# Patient Record
Sex: Female | Born: 1960 | Race: White | Hispanic: No | Marital: Married | Smoking: Never smoker
Health system: Southern US, Community
[De-identification: ages and names within clinical notes are randomized; demographics above are authoritative.]

## PROBLEM LIST (undated history)

## (undated) DIAGNOSIS — Z9221 Personal history of antineoplastic chemotherapy: Secondary | ICD-10-CM

## (undated) DIAGNOSIS — C50919 Malignant neoplasm of unspecified site of unspecified female breast: Secondary | ICD-10-CM

## (undated) DIAGNOSIS — R195 Other fecal abnormalities: Secondary | ICD-10-CM

## (undated) DIAGNOSIS — Z1371 Encounter for nonprocreative screening for genetic disease carrier status: Secondary | ICD-10-CM

## (undated) DIAGNOSIS — E041 Nontoxic single thyroid nodule: Secondary | ICD-10-CM

## (undated) DIAGNOSIS — Z923 Personal history of irradiation: Secondary | ICD-10-CM

## (undated) DIAGNOSIS — N649 Disorder of breast, unspecified: Secondary | ICD-10-CM

## (undated) DIAGNOSIS — C50911 Malignant neoplasm of unspecified site of right female breast: Principal | ICD-10-CM

## (undated) DIAGNOSIS — N83209 Unspecified ovarian cyst, unspecified side: Secondary | ICD-10-CM

## (undated) HISTORY — PX: BREAST CYST EXCISION: SHX579

## (undated) HISTORY — DX: Nontoxic single thyroid nodule: E04.1

## (undated) HISTORY — DX: Encounter for nonprocreative screening for genetic disease carrier status: Z13.71

## (undated) HISTORY — DX: Disorder of breast, unspecified: N64.9

## (undated) HISTORY — DX: Unspecified ovarian cyst, unspecified side: N83.209

## (undated) HISTORY — PX: CHOLECYSTECTOMY: SHX55

## (undated) HISTORY — PX: BREAST LUMPECTOMY: SHX2

## (undated) HISTORY — PX: LIPOMA EXCISION: SHX5283

## (undated) HISTORY — DX: Malignant neoplasm of unspecified site of right female breast: C50.911

## (undated) HISTORY — DX: Malignant neoplasm of unspecified site of unspecified female breast: C50.919

## (undated) HISTORY — DX: Other fecal abnormalities: R19.5

---

## 2001-07-11 ENCOUNTER — Other Ambulatory Visit: Admission: RE | Admit: 2001-07-11 | Discharge: 2001-07-11 | Payer: Self-pay | Admitting: Obstetrics and Gynecology

## 2001-10-22 ENCOUNTER — Encounter: Payer: Self-pay | Admitting: Obstetrics and Gynecology

## 2001-10-22 ENCOUNTER — Ambulatory Visit (HOSPITAL_COMMUNITY): Admission: RE | Admit: 2001-10-22 | Discharge: 2001-10-22 | Payer: Self-pay | Admitting: Obstetrics and Gynecology

## 2003-01-15 ENCOUNTER — Encounter: Payer: Self-pay | Admitting: Specialist

## 2003-01-15 ENCOUNTER — Ambulatory Visit (HOSPITAL_COMMUNITY): Admission: RE | Admit: 2003-01-15 | Discharge: 2003-01-15 | Payer: Self-pay | Admitting: Specialist

## 2004-03-15 ENCOUNTER — Ambulatory Visit (HOSPITAL_COMMUNITY): Admission: RE | Admit: 2004-03-15 | Discharge: 2004-03-15 | Payer: Self-pay | Admitting: Obstetrics and Gynecology

## 2004-10-28 ENCOUNTER — Ambulatory Visit (HOSPITAL_COMMUNITY): Admission: RE | Admit: 2004-10-28 | Discharge: 2004-10-28 | Payer: Self-pay | Admitting: General Surgery

## 2005-03-14 ENCOUNTER — Ambulatory Visit (HOSPITAL_COMMUNITY): Admission: RE | Admit: 2005-03-14 | Discharge: 2005-03-14 | Payer: Self-pay | Admitting: Obstetrics and Gynecology

## 2005-04-19 ENCOUNTER — Ambulatory Visit (HOSPITAL_COMMUNITY): Admission: RE | Admit: 2005-04-19 | Discharge: 2005-04-19 | Payer: Self-pay | Admitting: Obstetrics and Gynecology

## 2005-10-12 ENCOUNTER — Ambulatory Visit (HOSPITAL_COMMUNITY): Admission: RE | Admit: 2005-10-12 | Discharge: 2005-10-12 | Payer: Self-pay | Admitting: Obstetrics and Gynecology

## 2005-11-08 ENCOUNTER — Ambulatory Visit (HOSPITAL_COMMUNITY): Admission: RE | Admit: 2005-11-08 | Discharge: 2005-11-08 | Payer: Self-pay | Admitting: Obstetrics and Gynecology

## 2005-11-21 ENCOUNTER — Encounter (HOSPITAL_COMMUNITY): Admission: RE | Admit: 2005-11-21 | Discharge: 2005-11-22 | Payer: Self-pay | Admitting: Endocrinology

## 2005-12-27 ENCOUNTER — Encounter (HOSPITAL_COMMUNITY): Admission: RE | Admit: 2005-12-27 | Discharge: 2006-01-26 | Payer: Self-pay | Admitting: Endocrinology

## 2006-04-11 ENCOUNTER — Ambulatory Visit (HOSPITAL_COMMUNITY): Admission: RE | Admit: 2006-04-11 | Discharge: 2006-04-11 | Payer: Self-pay | Admitting: Obstetrics and Gynecology

## 2008-10-16 ENCOUNTER — Ambulatory Visit (HOSPITAL_COMMUNITY): Admission: RE | Admit: 2008-10-16 | Discharge: 2008-10-16 | Payer: Self-pay | Admitting: Obstetrics & Gynecology

## 2008-11-13 ENCOUNTER — Other Ambulatory Visit: Admission: RE | Admit: 2008-11-13 | Discharge: 2008-11-13 | Payer: Self-pay | Admitting: Obstetrics and Gynecology

## 2008-11-18 ENCOUNTER — Ambulatory Visit (HOSPITAL_COMMUNITY): Admission: RE | Admit: 2008-11-18 | Discharge: 2008-11-18 | Payer: Self-pay | Admitting: Obstetrics & Gynecology

## 2009-09-22 ENCOUNTER — Ambulatory Visit (HOSPITAL_COMMUNITY): Admission: RE | Admit: 2009-09-22 | Discharge: 2009-09-22 | Payer: Self-pay | Admitting: Obstetrics and Gynecology

## 2009-09-28 ENCOUNTER — Encounter: Admission: RE | Admit: 2009-09-28 | Discharge: 2009-09-28 | Payer: Self-pay | Admitting: Obstetrics and Gynecology

## 2009-09-29 ENCOUNTER — Encounter (HOSPITAL_COMMUNITY): Admission: RE | Admit: 2009-09-29 | Discharge: 2009-10-29 | Payer: Self-pay | Admitting: Oncology

## 2009-09-29 ENCOUNTER — Ambulatory Visit (HOSPITAL_COMMUNITY): Payer: Self-pay | Admitting: Oncology

## 2009-10-01 ENCOUNTER — Ambulatory Visit: Payer: Self-pay | Admitting: Cardiology

## 2009-10-01 ENCOUNTER — Encounter (HOSPITAL_COMMUNITY): Payer: Self-pay | Admitting: Oncology

## 2009-10-06 ENCOUNTER — Ambulatory Visit (HOSPITAL_COMMUNITY): Admission: RE | Admit: 2009-10-06 | Discharge: 2009-10-06 | Payer: Self-pay | Admitting: General Surgery

## 2009-10-06 ENCOUNTER — Ambulatory Visit: Payer: Self-pay | Admitting: Genetic Counselor

## 2009-10-18 ENCOUNTER — Ambulatory Visit (HOSPITAL_COMMUNITY): Admission: RE | Admit: 2009-10-18 | Discharge: 2009-10-18 | Payer: Self-pay | Admitting: Oncology

## 2009-11-01 ENCOUNTER — Encounter (HOSPITAL_COMMUNITY): Admission: RE | Admit: 2009-11-01 | Discharge: 2009-12-01 | Payer: Self-pay | Admitting: Oncology

## 2009-11-15 ENCOUNTER — Ambulatory Visit (HOSPITAL_COMMUNITY): Payer: Self-pay | Admitting: Oncology

## 2009-11-25 ENCOUNTER — Encounter (HOSPITAL_COMMUNITY): Payer: Self-pay | Admitting: Oncology

## 2009-11-25 ENCOUNTER — Ambulatory Visit: Payer: Self-pay | Admitting: Internal Medicine

## 2009-12-13 ENCOUNTER — Encounter (HOSPITAL_COMMUNITY): Admission: RE | Admit: 2009-12-13 | Discharge: 2010-01-12 | Payer: Self-pay | Admitting: Oncology

## 2009-12-20 ENCOUNTER — Ambulatory Visit: Payer: Self-pay | Admitting: Cardiology

## 2009-12-20 ENCOUNTER — Encounter (HOSPITAL_COMMUNITY): Payer: Self-pay | Admitting: Oncology

## 2010-01-10 ENCOUNTER — Ambulatory Visit (HOSPITAL_COMMUNITY): Payer: Self-pay | Admitting: Oncology

## 2010-01-17 ENCOUNTER — Encounter (HOSPITAL_COMMUNITY): Admission: RE | Admit: 2010-01-17 | Discharge: 2010-02-16 | Payer: Self-pay | Admitting: Oncology

## 2010-01-19 ENCOUNTER — Ambulatory Visit (HOSPITAL_COMMUNITY): Admission: RE | Admit: 2010-01-19 | Discharge: 2010-01-19 | Payer: Self-pay | Admitting: Oncology

## 2010-02-21 ENCOUNTER — Encounter (HOSPITAL_COMMUNITY): Admission: RE | Admit: 2010-02-21 | Discharge: 2010-02-25 | Payer: Self-pay | Admitting: Oncology

## 2010-02-28 ENCOUNTER — Encounter (HOSPITAL_COMMUNITY)
Admission: RE | Admit: 2010-02-28 | Discharge: 2010-03-30 | Payer: Self-pay | Source: Home / Self Care | Admitting: Oncology

## 2010-02-28 ENCOUNTER — Ambulatory Visit (HOSPITAL_COMMUNITY): Payer: Self-pay | Admitting: Oncology

## 2010-04-13 ENCOUNTER — Ambulatory Visit (HOSPITAL_COMMUNITY): Admission: RE | Admit: 2010-04-13 | Discharge: 2010-04-13 | Payer: Self-pay | Admitting: General Surgery

## 2010-04-13 ENCOUNTER — Encounter: Admission: RE | Admit: 2010-04-13 | Discharge: 2010-04-13 | Payer: Self-pay | Admitting: General Surgery

## 2010-04-27 ENCOUNTER — Ambulatory Visit (HOSPITAL_COMMUNITY): Payer: Self-pay | Admitting: Oncology

## 2010-04-27 ENCOUNTER — Encounter (HOSPITAL_COMMUNITY)
Admission: RE | Admit: 2010-04-27 | Discharge: 2010-05-27 | Payer: Self-pay | Source: Home / Self Care | Attending: Oncology | Admitting: Oncology

## 2010-05-03 ENCOUNTER — Ambulatory Visit
Admission: RE | Admit: 2010-05-03 | Discharge: 2010-05-26 | Payer: Self-pay | Source: Home / Self Care | Attending: Radiation Oncology | Admitting: Radiation Oncology

## 2010-05-10 ENCOUNTER — Other Ambulatory Visit
Admission: RE | Admit: 2010-05-10 | Discharge: 2010-05-10 | Payer: Self-pay | Source: Home / Self Care | Admitting: Obstetrics and Gynecology

## 2010-05-31 ENCOUNTER — Ambulatory Visit
Admission: RE | Admit: 2010-05-31 | Discharge: 2010-06-28 | Payer: Self-pay | Source: Home / Self Care | Attending: Radiation Oncology | Admitting: Radiation Oncology

## 2010-06-19 ENCOUNTER — Encounter (HOSPITAL_COMMUNITY): Payer: Self-pay | Admitting: Internal Medicine

## 2010-06-24 ENCOUNTER — Ambulatory Visit (HOSPITAL_COMMUNITY): Admit: 2010-06-24 | Discharge: 2010-06-28 | Payer: Self-pay | Attending: Oncology | Admitting: Oncology

## 2010-06-24 ENCOUNTER — Encounter (HOSPITAL_COMMUNITY)
Admission: RE | Admit: 2010-06-24 | Discharge: 2010-06-28 | Payer: Self-pay | Source: Home / Self Care | Attending: Oncology | Admitting: Oncology

## 2010-06-29 ENCOUNTER — Ambulatory Visit: Payer: Self-pay | Admitting: Radiation Oncology

## 2010-08-04 ENCOUNTER — Other Ambulatory Visit (HOSPITAL_COMMUNITY): Payer: Self-pay

## 2010-08-09 LAB — BASIC METABOLIC PANEL
BUN: 20 mg/dL (ref 6–23)
CO2: 28 mEq/L (ref 19–32)
Chloride: 103 mEq/L (ref 96–112)
Creatinine, Ser: 0.75 mg/dL (ref 0.4–1.2)
Glucose, Bld: 101 mg/dL — ABNORMAL HIGH (ref 70–99)
Potassium: 3.8 mEq/L (ref 3.5–5.1)

## 2010-08-09 LAB — CBC
HCT: 38.9 % (ref 36.0–46.0)
Hemoglobin: 13.2 g/dL (ref 12.0–15.0)
MCHC: 33.9 g/dL (ref 30.0–36.0)
MCV: 107.8 fL — ABNORMAL HIGH (ref 78.0–100.0)
RDW: 13.5 % (ref 11.5–15.5)

## 2010-08-10 LAB — DIFFERENTIAL
Basophils Absolute: 0 10*3/uL (ref 0.0–0.1)
Basophils Absolute: 0 10*3/uL (ref 0.0–0.1)
Basophils Absolute: 0 10*3/uL (ref 0.0–0.1)
Basophils Relative: 0 % (ref 0–1)
Basophils Relative: 1 % (ref 0–1)
Basophils Relative: 1 % (ref 0–1)
Eosinophils Absolute: 0.1 10*3/uL (ref 0.0–0.7)
Eosinophils Absolute: 0.1 10*3/uL (ref 0.0–0.7)
Eosinophils Absolute: 0.1 10*3/uL (ref 0.0–0.7)
Eosinophils Relative: 2 % (ref 0–5)
Eosinophils Relative: 2 % (ref 0–5)
Eosinophils Relative: 3 % (ref 0–5)
Lymphocytes Relative: 29 % (ref 12–46)
Lymphocytes Relative: 30 % (ref 12–46)
Lymphocytes Relative: 33 % (ref 12–46)
Lymphs Abs: 1.2 10*3/uL (ref 0.7–4.0)
Lymphs Abs: 1.2 10*3/uL (ref 0.7–4.0)
Monocytes Absolute: 0.5 10*3/uL (ref 0.1–1.0)
Monocytes Absolute: 0.5 10*3/uL (ref 0.1–1.0)
Monocytes Absolute: 0.7 10*3/uL (ref 0.1–1.0)
Monocytes Relative: 11 % (ref 3–12)
Monocytes Relative: 12 % (ref 3–12)
Monocytes Relative: 12 % (ref 3–12)
Monocytes Relative: 12 % (ref 3–12)
Neutro Abs: 2.2 10*3/uL (ref 1.7–7.7)
Neutro Abs: 2.3 10*3/uL (ref 1.7–7.7)
Neutro Abs: 2.4 10*3/uL (ref 1.7–7.7)
Neutro Abs: 2.4 10*3/uL (ref 1.7–7.7)
Neutrophils Relative %: 55 % (ref 43–77)
Neutrophils Relative %: 59 % (ref 43–77)

## 2010-08-10 LAB — CBC
HCT: 30.7 % — ABNORMAL LOW (ref 36.0–46.0)
HCT: 33.2 % — ABNORMAL LOW (ref 36.0–46.0)
HCT: 33.3 % — ABNORMAL LOW (ref 36.0–46.0)
HCT: 33.5 % — ABNORMAL LOW (ref 36.0–46.0)
Hemoglobin: 11.4 g/dL — ABNORMAL LOW (ref 12.0–15.0)
Hemoglobin: 11.5 g/dL — ABNORMAL LOW (ref 12.0–15.0)
MCH: 37.8 pg — ABNORMAL HIGH (ref 26.0–34.0)
MCH: 37.8 pg — ABNORMAL HIGH (ref 26.0–34.0)
MCH: 38 pg — ABNORMAL HIGH (ref 26.0–34.0)
MCH: 38.4 pg — ABNORMAL HIGH (ref 26.0–34.0)
MCHC: 34.3 g/dL (ref 30.0–36.0)
MCHC: 34.3 g/dL (ref 30.0–36.0)
MCHC: 34.8 g/dL (ref 30.0–36.0)
MCV: 109.4 fL — ABNORMAL HIGH (ref 78.0–100.0)
MCV: 110.1 fL — ABNORMAL HIGH (ref 78.0–100.0)
MCV: 110.3 fL — ABNORMAL HIGH (ref 78.0–100.0)
MCV: 110.3 fL — ABNORMAL HIGH (ref 78.0–100.0)
Platelets: 253 10*3/uL (ref 150–400)
Platelets: 283 10*3/uL (ref 150–400)
RBC: 2.78 MIL/uL — ABNORMAL LOW (ref 3.87–5.11)
RDW: 14.5 % (ref 11.5–15.5)
RDW: 14.7 % (ref 11.5–15.5)
RDW: 15.3 % (ref 11.5–15.5)
RDW: 15.4 % (ref 11.5–15.5)
WBC: 4.2 10*3/uL (ref 4.0–10.5)
WBC: 4.3 10*3/uL (ref 4.0–10.5)

## 2010-08-10 LAB — COMPREHENSIVE METABOLIC PANEL
ALT: 21 U/L (ref 0–35)
Albumin: 3.8 g/dL (ref 3.5–5.2)
Alkaline Phosphatase: 30 U/L — ABNORMAL LOW (ref 39–117)
Alkaline Phosphatase: 34 U/L — ABNORMAL LOW (ref 39–117)
BUN: 13 mg/dL (ref 6–23)
Calcium: 9.2 mg/dL (ref 8.4–10.5)
Calcium: 9.6 mg/dL (ref 8.4–10.5)
Creatinine, Ser: 0.6 mg/dL (ref 0.4–1.2)
Glucose, Bld: 100 mg/dL — ABNORMAL HIGH (ref 70–99)
Glucose, Bld: 95 mg/dL (ref 70–99)
Potassium: 4 mEq/L (ref 3.5–5.1)
Sodium: 139 mEq/L (ref 135–145)
Total Protein: 6.2 g/dL (ref 6.0–8.3)
Total Protein: 6.3 g/dL (ref 6.0–8.3)

## 2010-08-11 LAB — CBC
HCT: 30.9 % — ABNORMAL LOW (ref 36.0–46.0)
Hemoglobin: 10.6 g/dL — ABNORMAL LOW (ref 12.0–15.0)
Hemoglobin: 10.8 g/dL — ABNORMAL LOW (ref 12.0–15.0)
MCH: 38.3 pg — ABNORMAL HIGH (ref 26.0–34.0)
MCH: 38.4 pg — ABNORMAL HIGH (ref 26.0–34.0)
MCH: 37.3 pg — ABNORMAL HIGH (ref 26.0–34.0)
MCV: 109.9 fL — ABNORMAL HIGH (ref 78.0–100.0)
MCV: 108.5 fL — ABNORMAL HIGH (ref 78.0–100.0)
MCV: 109.2 fL — ABNORMAL HIGH (ref 78.0–100.0)
Platelets: 293 10*3/uL (ref 150–400)
Platelets: 255 10*3/uL (ref 150–400)
Platelets: 256 10*3/uL (ref 150–400)
RBC: 2.75 MIL/uL — ABNORMAL LOW (ref 3.87–5.11)
RBC: 2.81 MIL/uL — ABNORMAL LOW (ref 3.87–5.11)
RDW: 17 % — ABNORMAL HIGH (ref 11.5–15.5)
RDW: 18.1 % — ABNORMAL HIGH (ref 11.5–15.5)
WBC: 4 10*3/uL (ref 4.0–10.5)

## 2010-08-11 LAB — DIFFERENTIAL
Basophils Absolute: 0 10*3/uL (ref 0.0–0.1)
Basophils Relative: 1 % (ref 0–1)
Eosinophils Absolute: 0.1 10*3/uL (ref 0.0–0.7)
Eosinophils Absolute: 0.1 10*3/uL (ref 0.0–0.7)
Eosinophils Absolute: 0.1 10*3/uL (ref 0.0–0.7)
Eosinophils Relative: 3 % (ref 0–5)
Eosinophils Relative: 3 % (ref 0–5)
Lymphs Abs: 0.9 10*3/uL (ref 0.7–4.0)
Lymphs Abs: 1 10*3/uL (ref 0.7–4.0)
Lymphs Abs: 0.9 10*3/uL (ref 0.7–4.0)
Monocytes Absolute: 0.5 10*3/uL (ref 0.1–1.0)
Monocytes Relative: 14 % — ABNORMAL HIGH (ref 3–12)
Monocytes Relative: 14 % — ABNORMAL HIGH (ref 3–12)
Monocytes Relative: 12 % (ref 3–12)
Neutro Abs: 2.5 10*3/uL (ref 1.7–7.7)
Neutrophils Relative %: 61 % (ref 43–77)

## 2010-08-11 LAB — COMPREHENSIVE METABOLIC PANEL
Albumin: 3.6 g/dL (ref 3.5–5.2)
Alkaline Phosphatase: 33 U/L — ABNORMAL LOW (ref 39–117)
BUN: 17 mg/dL (ref 6–23)
Potassium: 3.7 mEq/L (ref 3.5–5.1)
Sodium: 134 mEq/L — ABNORMAL LOW (ref 135–145)
Total Protein: 6.3 g/dL (ref 6.0–8.3)

## 2010-08-12 LAB — COMPREHENSIVE METABOLIC PANEL
BUN: 15 mg/dL (ref 6–23)
CO2: 24 mEq/L (ref 19–32)
Calcium: 8.9 mg/dL (ref 8.4–10.5)
Creatinine, Ser: 0.63 mg/dL (ref 0.4–1.2)
GFR calc Af Amer: >60 mL/min (ref >60)
GFR calc non Af Amer: >60 mL/min (ref >60)
Glucose, Bld: 105 mg/dL — ABNORMAL HIGH (ref 70–99)
Total Bilirubin: 0.3 mg/dL (ref 0.3–1.2)

## 2010-08-12 LAB — DIFFERENTIAL
Basophils Absolute: 0 10*3/uL (ref 0.0–0.1)
Basophils Absolute: 0 10*3/uL (ref 0.0–0.1)
Basophils Relative: 1 % (ref 0–1)
Basophils Relative: 1 % (ref 0–1)
Eosinophils Absolute: 0 10*3/uL (ref 0.0–0.7)
Eosinophils Absolute: 0 10*3/uL (ref 0.0–0.7)
Eosinophils Absolute: 0.1 10*3/uL (ref 0.0–0.7)
Lymphocytes Relative: 11 % — ABNORMAL LOW (ref 12–46)
Lymphs Abs: 0.8 10*3/uL (ref 0.7–4.0)
Lymphs Abs: 1.1 10*3/uL (ref 0.7–4.0)
Monocytes Absolute: 0.7 10*3/uL (ref 0.1–1.0)
Monocytes Relative: 14 % — ABNORMAL HIGH (ref 3–12)
Monocytes Relative: 15 % — ABNORMAL HIGH (ref 3–12)
Neutro Abs: 5.1 10*3/uL (ref 1.7–7.7)
Neutrophils Relative %: 61 % (ref 43–77)
Neutrophils Relative %: 73 % (ref 43–77)
Neutrophils Relative %: 74 % (ref 43–77)

## 2010-08-12 LAB — CBC
HCT: 29 % — ABNORMAL LOW (ref 36.0–46.0)
HCT: 32.3 % — ABNORMAL LOW (ref 36.0–46.0)
Hemoglobin: 10 g/dL — ABNORMAL LOW (ref 12.0–15.0)
Hemoglobin: 11.2 g/dL — ABNORMAL LOW (ref 12.0–15.0)
MCH: 36 pg — ABNORMAL HIGH (ref 26.0–34.0)
MCH: 36.7 pg — ABNORMAL HIGH (ref 26.0–34.0)
MCH: 36.9 pg — ABNORMAL HIGH (ref 26.0–34.0)
MCH: 36.9 pg — ABNORMAL HIGH (ref 26.0–34.0)
MCHC: 34.4 g/dL (ref 30.0–36.0)
MCHC: 34.5 g/dL (ref 30.0–36.0)
MCHC: 34.7 g/dL (ref 30.0–36.0)
MCV: 106.4 fL — ABNORMAL HIGH (ref 78.0–100.0)
Platelets: 245 10*3/uL (ref 150–400)
Platelets: 287 10*3/uL (ref 150–400)
RBC: 2.92 MIL/uL — ABNORMAL LOW (ref 3.87–5.11)
WBC: 7.4 10*3/uL (ref 4.0–10.5)

## 2010-08-13 LAB — DIFFERENTIAL
Basophils Absolute: 0 10*3/uL (ref 0.0–0.1)
Basophils Relative: 1 % (ref 0–1)
Eosinophils Absolute: 0 10*3/uL (ref 0.0–0.7)
Eosinophils Relative: 0 % (ref 0–5)
Monocytes Absolute: 0.8 10*3/uL (ref 0.1–1.0)
Monocytes Relative: 11 % (ref 3–12)

## 2010-08-13 LAB — CBC
MCV: 102.6 fL — ABNORMAL HIGH (ref 78.0–100.0)
Platelets: 182 10*3/uL (ref 150–400)
RBC: 3.06 MIL/uL — ABNORMAL LOW (ref 3.87–5.11)
WBC: 6.8 10*3/uL (ref 4.0–10.5)

## 2010-08-13 LAB — COMPREHENSIVE METABOLIC PANEL
ALT: 16 U/L (ref 0–35)
AST: 20 U/L (ref 0–37)
Albumin: 3.8 g/dL (ref 3.5–5.2)
Alkaline Phosphatase: 49 U/L (ref 39–117)
BUN: 15 mg/dL (ref 6–23)
Chloride: 110 mEq/L (ref 96–112)
GFR calc Af Amer: >60 mL/min (ref >60)
Potassium: 3.8 mEq/L (ref 3.5–5.1)
Sodium: 141 mEq/L (ref 135–145)
Total Bilirubin: 0.2 mg/dL — ABNORMAL LOW (ref 0.3–1.2)

## 2010-08-14 LAB — DIFFERENTIAL
Basophils Absolute: 0 10*3/uL (ref 0.0–0.1)
Basophils Relative: 0 % (ref 0–1)
Basophils Relative: 0 % (ref 0–1)
Eosinophils Absolute: 0 10*3/uL (ref 0.0–0.7)
Eosinophils Absolute: 0 10*3/uL (ref 0.0–0.7)
Eosinophils Relative: 0 % (ref 0–5)
Lymphs Abs: 1.6 10*3/uL (ref 0.7–4.0)
Monocytes Absolute: 1 10*3/uL (ref 0.1–1.0)
Monocytes Absolute: 0.8 10*3/uL (ref 0.1–1.0)
Monocytes Relative: 8 % (ref 3–12)
Neutro Abs: 5.4 10*3/uL (ref 1.7–7.7)
Neutrophils Relative %: 71 % (ref 43–77)
Neutrophils Relative %: 75 % (ref 43–77)

## 2010-08-14 LAB — COMPREHENSIVE METABOLIC PANEL
ALT: 13 U/L (ref 0–35)
AST: 18 U/L (ref 0–37)
Alkaline Phosphatase: 47 U/L (ref 39–117)
Calcium: 9 mg/dL (ref 8.4–10.5)
GFR calc Af Amer: >60 mL/min (ref >60)
Glucose, Bld: 127 mg/dL — ABNORMAL HIGH (ref 70–99)
Potassium: 3.6 mEq/L (ref 3.5–5.1)
Sodium: 139 mEq/L (ref 135–145)
Total Protein: 6.3 g/dL (ref 6.0–8.3)

## 2010-08-14 LAB — CBC
Hemoglobin: 11.3 g/dL — ABNORMAL LOW (ref 12.0–15.0)
MCH: 35.8 pg — ABNORMAL HIGH (ref 26.0–34.0)
MCHC: 34.8 g/dL (ref 30.0–36.0)
MCHC: 34.5 g/dL (ref 30.0–36.0)
RBC: 3.18 MIL/uL — ABNORMAL LOW (ref 3.87–5.11)
RDW: 14.4 % (ref 11.5–15.5)
RDW: 13.3 % (ref 11.5–15.5)

## 2010-08-15 LAB — CBC
Hemoglobin: 12.3 g/dL (ref 12.0–15.0)
MCHC: 34.7 g/dL (ref 30.0–36.0)
MCHC: 35.6 g/dL (ref 30.0–36.0)
MCHC: 33.8 g/dL (ref 30.0–36.0)
MCV: 102.1 fL — ABNORMAL HIGH (ref 78.0–100.0)
MCV: 103 fL — ABNORMAL HIGH (ref 78.0–100.0)
Platelets: 207 10*3/uL (ref 150–400)
Platelets: 300 10*3/uL (ref 150–400)
RBC: 3.42 MIL/uL — ABNORMAL LOW (ref 3.87–5.11)
RDW: 13.1 % (ref 11.5–15.5)
WBC: 6.3 10*3/uL (ref 4.0–10.5)

## 2010-08-15 LAB — DIFFERENTIAL
Basophils Absolute: 0 10*3/uL (ref 0.0–0.1)
Basophils Relative: 0 % (ref 0–1)
Basophils Relative: 1 % (ref 0–1)
Eosinophils Absolute: 0 10*3/uL (ref 0.0–0.7)
Eosinophils Absolute: 0.1 10*3/uL (ref 0.0–0.7)
Eosinophils Relative: 2 % (ref 0–5)
Lymphs Abs: 2.4 10*3/uL (ref 0.7–4.0)
Monocytes Relative: 9 % (ref 3–12)
Monocytes Relative: 10 % (ref 3–12)
Neutro Abs: 3.7 10*3/uL (ref 1.7–7.7)
Neutrophils Relative %: 63 % (ref 43–77)

## 2010-08-15 LAB — COMPREHENSIVE METABOLIC PANEL
ALT: 13 U/L (ref 0–35)
AST: 18 U/L (ref 0–37)
Alkaline Phosphatase: 39 U/L (ref 39–117)
CO2: 22 mEq/L (ref 19–32)
Calcium: 8.9 mg/dL (ref 8.4–10.5)
GFR calc Af Amer: >60 mL/min (ref >60)
GFR calc non Af Amer: >60 mL/min (ref >60)
Glucose, Bld: 99 mg/dL (ref 70–99)
Potassium: 3.5 mEq/L (ref 3.5–5.1)
Sodium: 136 mEq/L (ref 135–145)

## 2010-08-15 LAB — PROTIME-INR
INR: 0.96 (ref 0.00–1.49)
Prothrombin Time: 12.7 seconds (ref 11.6–15.2)

## 2010-08-16 LAB — URINALYSIS, ROUTINE W REFLEX MICROSCOPIC
Bilirubin Urine: NEGATIVE
Glucose, UA: NEGATIVE mg/dL
Nitrite: NEGATIVE
Specific Gravity, Urine: 1.025 (ref 1.005–1.030)
pH: 6.5 (ref 5.0–8.0)

## 2010-08-16 LAB — URINE MICROSCOPIC-ADD ON

## 2010-08-16 LAB — COMPREHENSIVE METABOLIC PANEL
AST: 16 U/L (ref 0–37)
Albumin: 3.8 g/dL (ref 3.5–5.2)
Calcium: 9.3 mg/dL (ref 8.4–10.5)
Creatinine, Ser: 0.79 mg/dL (ref 0.4–1.2)
GFR calc Af Amer: >60 mL/min (ref >60)
GFR calc non Af Amer: >60 mL/min (ref >60)
Total Protein: 7.1 g/dL (ref 6.0–8.3)

## 2010-08-16 LAB — TSH: TSH: 1.967 u[IU]/mL (ref 0.350–4.500)

## 2010-08-16 LAB — CBC
MCHC: 36.3 g/dL — ABNORMAL HIGH (ref 30.0–36.0)
MCV: 100.5 fL — ABNORMAL HIGH (ref 78.0–100.0)
Platelets: 261 10*3/uL (ref 150–400)
RDW: 12.8 % (ref 11.5–15.5)

## 2010-08-16 LAB — DIFFERENTIAL
Eosinophils Relative: 2 % (ref 0–5)
Lymphocytes Relative: 36 % (ref 12–46)
Lymphs Abs: 2.8 10*3/uL (ref 0.7–4.0)
Monocytes Absolute: 0.7 10*3/uL (ref 0.1–1.0)
Monocytes Relative: 9 % (ref 3–12)

## 2010-08-24 ENCOUNTER — Encounter (HOSPITAL_COMMUNITY): Payer: Managed Care, Other (non HMO) | Attending: Oncology

## 2010-08-24 ENCOUNTER — Other Ambulatory Visit (HOSPITAL_COMMUNITY): Payer: Self-pay | Admitting: Oncology

## 2010-08-24 ENCOUNTER — Other Ambulatory Visit (HOSPITAL_COMMUNITY): Payer: Managed Care, Other (non HMO)

## 2010-08-24 ENCOUNTER — Ambulatory Visit (HOSPITAL_COMMUNITY): Payer: Managed Care, Other (non HMO) | Admitting: Oncology

## 2010-08-24 DIAGNOSIS — C50919 Malignant neoplasm of unspecified site of unspecified female breast: Secondary | ICD-10-CM

## 2010-08-24 DIAGNOSIS — Z923 Personal history of irradiation: Secondary | ICD-10-CM | POA: Insufficient documentation

## 2010-08-24 DIAGNOSIS — Z79899 Other long term (current) drug therapy: Secondary | ICD-10-CM | POA: Insufficient documentation

## 2010-10-12 ENCOUNTER — Other Ambulatory Visit (HOSPITAL_COMMUNITY): Payer: Self-pay | Admitting: Oncology

## 2010-10-12 ENCOUNTER — Encounter (HOSPITAL_COMMUNITY): Payer: Managed Care, Other (non HMO) | Attending: Oncology

## 2010-10-12 ENCOUNTER — Encounter (HOSPITAL_COMMUNITY): Payer: Managed Care, Other (non HMO)

## 2010-10-12 DIAGNOSIS — Z923 Personal history of irradiation: Secondary | ICD-10-CM | POA: Insufficient documentation

## 2010-10-12 DIAGNOSIS — C50919 Malignant neoplasm of unspecified site of unspecified female breast: Secondary | ICD-10-CM

## 2010-10-12 DIAGNOSIS — Z452 Encounter for adjustment and management of vascular access device: Secondary | ICD-10-CM

## 2010-10-12 DIAGNOSIS — Z79899 Other long term (current) drug therapy: Secondary | ICD-10-CM | POA: Insufficient documentation

## 2010-10-12 LAB — URINALYSIS, MICROSCOPIC ONLY
Bilirubin Urine: NEGATIVE
Hgb urine dipstick: NEGATIVE
Nitrite: NEGATIVE
Protein, ur: NEGATIVE mg/dL
Urobilinogen, UA: 0.2 mg/dL (ref 0.0–1.0)

## 2010-10-14 NOTE — Op Note (Signed)
NAME:  Vanessa Neal, Vanessa Neal                ACCOUNT NO.:  [id number]   MEDICAL RECORD NO.:  [id number]          PATIENT TYPE:  AMB   LOCATION:  DAY                           FACILITY:  [facility]   PHYSICIAN:  Dalia Heading, M.D.  DATE OF BIRTH:  1960/08/11   DATE OF PROCEDURE:  10/28/2004  DATE OF DISCHARGE:                                 OPERATIVE REPORT   PREOPERATIVE DIAGNOSIS:  Enlarging soft tissue mass, back.   POSTOPERATIVE DIAGNOSIS:  Lipoma of back.   PROCEDURE:  Excision of soft tissue mass, back.   SURGEON:  Dalia Heading, M.D.   ANESTHESIA:  General.   INDICATIONS:  The patient is a 50 year old white female who was referred for  evaluation and treatment of a mass on her back.  It has been present for  some time but recently has increased in size.  The patient now comes to the  operating room for excision of the mass on her back.  The risks and benefits  of the procedure were fully explained to the patient, who gave informed  consent.   PROCEDURE NOTE:  The patient was placed in the left lateral decubitus  position after general anesthesia was administered.  The right upper back was prepped and draped using the usual sterile  technique with Betadine.  Surgical site confirmation was performed.   A 5 cm subcutaneous mass was present along the right upper back.  A  longitudinal incision was made and the mass was excised in total without  difficulty.  A lipoma was found.  Any bleeding was controlled using Bovie  electrocautery.  The specimen was sent to pathology for further examination.  The subcutaneous layer was reapproximated using a 3-0 Vicryl interrupted  suture.  The skin was closed using a 4-0 Vicryl subcuticular suture.  Sensorcaine 0.5% was instilled into the surrounding wound.  Dermabond was then applied.   All tape and needle counts correct at the end of the procedure.  The patient  was awakened and transferred to PACU in stable condition.   COMPLICATIONS:   None.   SPECIMEN:  Mass, back.   BLOOD LOSS:  Minimal.      MAJ/MEDQ  D:  10/28/2004  T:  10/28/2004  Job:  [id number]   cc:   Tilda Burrow, M.D.  Fax: [phone]

## 2010-11-23 ENCOUNTER — Encounter (HOSPITAL_COMMUNITY): Payer: Managed Care, Other (non HMO)

## 2010-11-23 ENCOUNTER — Encounter (HOSPITAL_COMMUNITY): Payer: Managed Care, Other (non HMO) | Attending: Oncology

## 2010-11-23 DIAGNOSIS — Z79899 Other long term (current) drug therapy: Secondary | ICD-10-CM | POA: Insufficient documentation

## 2010-11-23 DIAGNOSIS — Z923 Personal history of irradiation: Secondary | ICD-10-CM | POA: Insufficient documentation

## 2010-11-23 DIAGNOSIS — C50919 Malignant neoplasm of unspecified site of unspecified female breast: Secondary | ICD-10-CM

## 2010-11-23 DIAGNOSIS — Z452 Encounter for adjustment and management of vascular access device: Secondary | ICD-10-CM

## 2011-01-04 ENCOUNTER — Encounter (HOSPITAL_COMMUNITY): Payer: Managed Care, Other (non HMO) | Attending: Oncology

## 2011-01-04 ENCOUNTER — Encounter (HOSPITAL_COMMUNITY): Payer: Managed Care, Other (non HMO)

## 2011-01-04 DIAGNOSIS — C50919 Malignant neoplasm of unspecified site of unspecified female breast: Secondary | ICD-10-CM

## 2011-01-04 DIAGNOSIS — C50911 Malignant neoplasm of unspecified site of right female breast: Secondary | ICD-10-CM | POA: Insufficient documentation

## 2011-01-04 DIAGNOSIS — Z452 Encounter for adjustment and management of vascular access device: Secondary | ICD-10-CM

## 2011-01-04 HISTORY — DX: Malignant neoplasm of unspecified site of right female breast: C50.911

## 2011-01-04 MED ORDER — HEPARIN SOD (PORK) LOCK FLUSH 100 UNIT/ML IV SOLN: 500.0000 [IU] | Freq: Once | INTRAVENOUS | Status: DC

## 2011-01-04 MED ORDER — HEPARIN SOD (PORK) LOCK FLUSH 100 UNIT/ML IV SOLN
INTRAVENOUS | Status: AC
  Filled 2011-01-04: qty 5

## 2011-01-04 NOTE — Progress Notes (Signed)
Port accessed and flushed per protocol.  Good blood return.  Tolerated well.

## 2011-01-25 ENCOUNTER — Ambulatory Visit (HOSPITAL_COMMUNITY)
Admission: RE | Admit: 2011-01-25 | Discharge: 2011-01-25 | Disposition: A | Discharge status: Home / Self Care | Payer: Managed Care, Other (non HMO) | Source: Ambulatory Visit | Attending: Oncology | Admitting: Oncology

## 2011-01-25 DIAGNOSIS — C50919 Malignant neoplasm of unspecified site of unspecified female breast: Secondary | ICD-10-CM

## 2011-01-25 DIAGNOSIS — Z853 Personal history of malignant neoplasm of breast: Secondary | ICD-10-CM | POA: Insufficient documentation

## 2011-02-07 ENCOUNTER — Other Ambulatory Visit: Payer: Self-pay | Admitting: Obstetrics & Gynecology

## 2011-02-07 ENCOUNTER — Other Ambulatory Visit (HOSPITAL_COMMUNITY)
Admission: RE | Admit: 2011-02-07 | Discharge: 2011-02-07 | Disposition: A | Discharge status: Home / Self Care | Payer: Managed Care, Other (non HMO) | Source: Ambulatory Visit | Attending: Obstetrics and Gynecology | Admitting: Obstetrics and Gynecology

## 2011-02-07 ENCOUNTER — Other Ambulatory Visit: Payer: Self-pay | Admitting: Adult Health

## 2011-02-07 DIAGNOSIS — E049 Nontoxic goiter, unspecified: Secondary | ICD-10-CM

## 2011-02-07 DIAGNOSIS — Z01419 Encounter for gynecological examination (general) (routine) without abnormal findings: Secondary | ICD-10-CM | POA: Insufficient documentation

## 2011-02-10 ENCOUNTER — Ambulatory Visit (HOSPITAL_COMMUNITY)
Admission: RE | Admit: 2011-02-10 | Discharge: 2011-02-10 | Disposition: A | Discharge status: Home / Self Care | Payer: Managed Care, Other (non HMO) | Source: Ambulatory Visit | Attending: Obstetrics & Gynecology | Admitting: Obstetrics & Gynecology

## 2011-02-10 ENCOUNTER — Other Ambulatory Visit: Payer: Self-pay | Admitting: Obstetrics & Gynecology

## 2011-02-10 DIAGNOSIS — E049 Nontoxic goiter, unspecified: Secondary | ICD-10-CM | POA: Insufficient documentation

## 2011-02-10 DIAGNOSIS — Z853 Personal history of malignant neoplasm of breast: Secondary | ICD-10-CM

## 2011-02-10 DIAGNOSIS — M549 Dorsalgia, unspecified: Secondary | ICD-10-CM

## 2011-02-10 DIAGNOSIS — M545 Low back pain, unspecified: Secondary | ICD-10-CM | POA: Insufficient documentation

## 2011-02-10 DIAGNOSIS — M47817 Spondylosis without myelopathy or radiculopathy, lumbosacral region: Secondary | ICD-10-CM | POA: Insufficient documentation

## 2011-02-21 ENCOUNTER — Telehealth (INDEPENDENT_AMBULATORY_CARE_PROVIDER_SITE_OTHER): Payer: Self-pay | Admitting: General Surgery

## 2011-02-22 ENCOUNTER — Encounter (HOSPITAL_COMMUNITY): Payer: Self-pay | Admitting: Oncology

## 2011-02-22 ENCOUNTER — Encounter (HOSPITAL_COMMUNITY): Payer: Managed Care, Other (non HMO) | Attending: Oncology | Admitting: Oncology

## 2011-02-22 VITALS — BP 133/91 | HR 76 | Temp 98.3°F | Ht 65.0 in | Wt 186.8 lb

## 2011-02-22 DIAGNOSIS — C50919 Malignant neoplasm of unspecified site of unspecified female breast: Secondary | ICD-10-CM | POA: Insufficient documentation

## 2011-02-22 DIAGNOSIS — Z170 Estrogen receptor positive status [ER+]: Secondary | ICD-10-CM

## 2011-02-22 DIAGNOSIS — E039 Hypothyroidism, unspecified: Secondary | ICD-10-CM | POA: Insufficient documentation

## 2011-02-22 LAB — COMPREHENSIVE METABOLIC PANEL
Albumin: 3.7 g/dL (ref 3.5–5.2)
BUN: 15 mg/dL (ref 6–23)
Calcium: 9.5 mg/dL (ref 8.4–10.5)
Creatinine, Ser: 0.6 mg/dL (ref 0.50–1.10)
Total Bilirubin: 0.4 mg/dL (ref 0.3–1.2)
Total Protein: 6.6 g/dL (ref 6.0–8.3)

## 2011-02-22 LAB — DIFFERENTIAL
Basophils Relative: 0 % (ref 0–1)
Eosinophils Absolute: 0.2 10*3/uL (ref 0.0–0.7)
Monocytes Absolute: 0.4 10*3/uL (ref 0.1–1.0)
Monocytes Relative: 7 % (ref 3–12)

## 2011-02-22 LAB — CBC
HCT: 36.3 % (ref 36.0–46.0)
Hemoglobin: 12.6 g/dL (ref 12.0–15.0)
MCH: 35.2 pg — ABNORMAL HIGH (ref 26.0–34.0)
MCHC: 34.7 g/dL (ref 30.0–36.0)

## 2011-02-22 LAB — LIPID PANEL
Cholesterol: 175 mg/dL (ref 0–200)
LDL Cholesterol: 92 mg/dL (ref 0–99)
Triglycerides: 155 mg/dL — ABNORMAL HIGH (ref <150)

## 2011-02-22 MED ORDER — SODIUM CHLORIDE 0.9 % IJ SOLN
INTRAMUSCULAR | Status: AC
  Administered 2011-02-22: 10 mL via INTRAVENOUS
  Filled 2011-02-22: qty 10

## 2011-02-22 MED ORDER — HEPARIN SOD (PORK) LOCK FLUSH 100 UNIT/ML IV SOLN
INTRAVENOUS | Status: AC
  Administered 2011-02-22: 500 [IU] via INTRAVENOUS
  Filled 2011-02-22: qty 5

## 2011-02-22 MED ORDER — SODIUM CHLORIDE 0.9 % IJ SOLN
10.0000 mL | INTRAMUSCULAR | Status: DC | PRN
  Administered 2011-02-22: 10 mL via INTRAVENOUS
  Filled 2011-02-22: qty 10

## 2011-02-22 MED ORDER — HEPARIN SOD (PORK) LOCK FLUSH 100 UNIT/ML IV SOLN
500.0000 [IU] | Freq: Once | INTRAVENOUS | Status: AC
  Administered 2011-02-22: 500 [IU] via INTRAVENOUS
  Filled 2011-02-22: qty 5

## 2011-02-22 NOTE — Progress Notes (Signed)
CC:   Almond Lint, MD Lurline Hare, M.D. Tilda Burrow, M.D.  DIAGNOSES: 1. 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  A repeat panel     on the primary tumor showed still to be ER positive 98%, PR     positive 57%, HER2/neu negative, Ki-67 marker read as 85%.  She     received AC dose dense followed by Taxol x12 weeks.  She had a very     nice response with tremendous reduction in the size of the tumor so     that pathologically she had a 1.1 cm lesion left.  Three sentinel     nodes were negative.  No LVI was seen.  Margins were clear and she     went on to have a definitive lumpectomy and sentinel node biopsies     on 04/13/2010, then was treated by Dr. Michell Heinrich with radiation     therapy completed as of 07/04/2010.  On 07/05/2010 she started     tamoxifen 20 mg once a day. BRCA-1 and 2 negativity. 2. Mild obesity. 3. History of radioactive iodine therapy for hyperthyroidism in 2006,     recommended Dr. Johny Chess. 4. Mid back lipoma resection in 2005 by Dr. Lovell Sheehan. 5. Cesarean section in 1984 and 1986. 6. Cholecystectomy in 1995. 7. Family history of breast cancer with her mother having ductal     carcinoma in situ at age 4 and grandmother having colon cancer     many years ago but not clear as to the age.  It is interesting that Ashlie has never been on thyroid medication since her radioactive iodine pill.  She is due to have blood work today and Cyril Mourning with Dr. Rayna Sexton group has allowed [place] to just draw the blood for everyone.  She also needs a lipid panel.  She has gained her weight back.  She is now back to 186 pounds which is really too much for her on a 5 foot 5 inch frame.  She occasionally has some low back discomfort, mid back discomfort, that comes and goes.  It is always usually at the end of a work day.   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  After she lays down in bed for 30-45 minutes the pain goes away.  She does not wake up with the pain, does not go to work with the pain.  She had some lumbar x-rays the other day which were showing degenerative changes only.  She also had a thyroid ultrasound which showed a small 8 x 6 mm indeterminate nodule which is being followed by Cyril Mourning.  She, however, with this weight gain needs a TSH level and needs one every year since she has had the radioactive iodine.  From the standpoint of her breast cancer, she does not have a positive review of systems but I was a little concerned about the back discomfort, but it is not constant, it is not getting worse, etc.  It is only intermittent.  Her vital signs today show a weight of 186 pounds, 5 feet 5 inches tall. BMI is 31.2.  blood pressure 133/91 left arm sitting position.  Her respirations are 16 and unlabored.  Pulse right around 76 and regular. Respirations unremarkable.  Temperature is normal.  Skin is warm and dry to the touch.  I do not feel thyroid enlargement or a thyroid nodule. She does not have lymphadenopathy in the cervical, supraclavicular, infraclavicular, axillary or inguinal areas.  Her lungs are clear to auscultation and percussion.  Heart shows a regular rhythm and rate without murmur, rub or gallop.  Both breasts are negative for any masses.  She has postsurgical and radiation therapy changes which are minimal.  Her abdomen is obese, nontender, without organomegaly or masses.  Bowel sounds are normal.  She has no peripheral edema.  She is alert and oriented.  Her hair has returned.  Her Port-A-Cath in the left upper chest wall is also intact and she would like it removed.  But I would like to get all these labs back before we make that decision.  I will do a cancer marker, TSH, vitamin D level, CMET, CBC and diff.  I will also do  a fasting lipid panel.  We will see her in 6 months.  She will continue the tamoxifen.  She is also going to have a colonoscopy scheduled by Cyril Mourning after she sees her again in 3 months for followup of her thyroid.  We will see her as I mentioned in 6 months.    ______________________________ Ladona Horns. Mariel Sleet, MD ESN/MEDQ  D:  02/22/2011  T:  02/22/2011  Job:  [id number]

## 2011-02-22 NOTE — Progress Notes (Signed)
Vanessa Neal presented for Portacath access and flush. Proper placement of portacath confirmed by CXR. Portacath located leftchest wall accessed with  H 20 needle. Good blood return present. Portacath flushed with 20ml NS and 500U/18ml Heparin and needle removed intact. Procedure without incident. Patient tolerated procedure well.

## 2011-02-22 NOTE — Progress Notes (Signed)
This office note has been dictated.

## 2011-02-22 NOTE — Patient Instructions (Signed)
[facility]  Discharge Instructions  RECOMMENDATIONS MADE BY THE CONSULTANT AND ANY TEST RESULTS WILL BE SENT TO YOUR REFERRING DOCTOR.     INSTRUCTIONS GIVEN AND DISCUSSED: Continue port flush every 6 weeks for now.  SPECIAL INSTRUCTIONS/FOLLOW-UP: Port flush done today with lab work. Call either Friday or Monday for results. MD will decide whether or not your port can be removed. We will see you back in 6 months for MD appointment.   I acknowledge that I have been informed and understand all the instructions given to me and received a copy. I do not have any more questions at this time, but understand that I may call the Specialty Clinic at [facility] at [phone] during business hours should I have any further questions or need assistance in obtaining follow-up care.    __________________________________________  _____________  __________ Signature of Patient or Authorized Representative            Date                   Time    __________________________________________ Nurse's Signature

## 2011-02-23 LAB — TSH: TSH: 3.011 u[IU]/mL (ref 0.350–4.500)

## 2011-02-23 LAB — CANCER ANTIGEN 27.29: CA 27.29: 14 U/mL (ref 0–39)

## 2011-02-23 LAB — VITAMIN D 25 HYDROXY (VIT D DEFICIENCY, FRACTURES): Vit D, 25-Hydroxy: 53 ng/mL (ref 30–89)

## 2011-02-24 ENCOUNTER — Telehealth (HOSPITAL_COMMUNITY): Payer: Self-pay

## 2011-02-24 NOTE — Telephone Encounter (Signed)
      Notes Recorded by Randall An, MD on 02/24/2011 at 10:28 AM Call her-all labs perfect except triglycerides which are probably wt. Related and diet related. Does she want to meet with our dietician??        [address] notified above information.  Does not want to meet with dietitian at this time, wants to try on her own.  Goes for port removal on 10/8.

## 2011-03-03 ENCOUNTER — Encounter (HOSPITAL_COMMUNITY)
Admission: RE | Admit: 2011-03-03 | Discharge: 2011-03-03 | Disposition: A | Discharge status: Home / Self Care | Payer: Managed Care, Other (non HMO) | Source: Ambulatory Visit | Attending: General Surgery | Admitting: General Surgery

## 2011-03-03 LAB — CBC
HCT: 37.2 % (ref 36.0–46.0)
Hemoglobin: 13.1 g/dL (ref 12.0–15.0)
MCH: 34.7 pg — ABNORMAL HIGH (ref 26.0–34.0)
MCV: 98.7 fL (ref 78.0–100.0)
RBC: 3.77 MIL/uL — ABNORMAL LOW (ref 3.87–5.11)

## 2011-03-03 LAB — DIFFERENTIAL
Lymphocytes Relative: 50 % — ABNORMAL HIGH (ref 12–46)
Lymphs Abs: 2.7 10*3/uL (ref 0.7–4.0)
Monocytes Relative: 8 % (ref 3–12)
Neutro Abs: 2.1 10*3/uL (ref 1.7–7.7)
Neutrophils Relative %: 39 % — ABNORMAL LOW (ref 43–77)

## 2011-03-03 LAB — SURGICAL PCR SCREEN
MRSA, PCR: NEGATIVE
Staphylococcus aureus: NEGATIVE

## 2011-03-06 ENCOUNTER — Ambulatory Visit (HOSPITAL_COMMUNITY)
Admission: RE | Admit: 2011-03-06 | Discharge: 2011-03-06 | Disposition: A | Discharge status: Home / Self Care | Payer: Managed Care, Other (non HMO) | Source: Ambulatory Visit | Attending: General Surgery | Admitting: General Surgery

## 2011-03-06 DIAGNOSIS — Z01812 Encounter for preprocedural laboratory examination: Secondary | ICD-10-CM | POA: Insufficient documentation

## 2011-03-06 DIAGNOSIS — Z452 Encounter for adjustment and management of vascular access device: Secondary | ICD-10-CM

## 2011-03-06 DIAGNOSIS — C50919 Malignant neoplasm of unspecified site of unspecified female breast: Secondary | ICD-10-CM | POA: Insufficient documentation

## 2011-03-14 ENCOUNTER — Telehealth (INDEPENDENT_AMBULATORY_CARE_PROVIDER_SITE_OTHER): Payer: Self-pay

## 2011-03-14 NOTE — Op Note (Signed)
  Vanessa Neal, Vanessa Neal                ACCOUNT NO.:  [id number]  MEDICAL RECORD NO.:  [id number]  LOCATION:                                 FACILITY:  PHYSICIAN:  Almond Lint, MD       DATE OF BIRTH:  06/11/1960  DATE OF PROCEDURE:  03/06/2011 DATE OF DISCHARGE:                              OPERATIVE REPORT   PREOPERATIVE DIAGNOSIS:  Right breast cancer.  POSTOPERATIVE DIAGNOSIS:  Right breast cancer.  PROCEDURE:  Removal of left subclavian Port-A-Cath.  SURGEON:  Almond Lint, MD.  ASSISTANT:  None.  ANESTHESIA:  MAC and local.  FINDINGS:  Capsular port with an 18-cm catheter.  SPECIMEN:  None.  ESTIMATED BLOOD LOSS:  Minimal.  COMPLICATIONS:  None known.  PROCEDURE IN DETAIL:  [name] was identified in the holding area and taken to the operating room where she was placed on the operating table.  MAC anesthesia was induced.  Her left chest was prepped and draped in sterile fashion.  Time-out was performed according to the surgical safety checklist.  When all was correct, we continued.  The prior incision was infiltrated with anesthetic and opened with the #15 blade.  The capsule was divided with the Metzenbaum scissors.  The Port- A-Cath was elevated out of the wound with Kocher.  The sutures were cut with a scalpel.  These were then removed.  The catheter was then removed from the subclavian vein and pressure was held along the tract for 2 minutes.  The wound was then inspected for hemostasis.  The skin incision was closed with 3-0 Vicryl deep dermal interrupted sutures and 4-0 Monocryl running subcuticular suture.  The wound was then cleaned, dried, and dressed with Dermabond.  The patient was awakened from anesthesia and taken to the PACU in stable condition.  Needle, sponge, and instrument counts were correct.     Almond Lint, MD     FB/MEDQ  D:  03/06/2011  T:  03/06/2011  Job:  [id number]  Electronically Signed by Almond Lint MD on 03/14/2011  07:55:51 PM

## 2011-03-14 NOTE — Telephone Encounter (Signed)
LMOM for pt to call our office to set up her f/u appt with Dr Donell Beers.Vanessa Neal

## 2011-04-05 ENCOUNTER — Encounter (HOSPITAL_COMMUNITY): Payer: Managed Care, Other (non HMO)

## 2011-04-17 ENCOUNTER — Encounter (INDEPENDENT_AMBULATORY_CARE_PROVIDER_SITE_OTHER): Payer: Managed Care, Other (non HMO) | Admitting: General Surgery

## 2011-04-28 ENCOUNTER — Encounter (INDEPENDENT_AMBULATORY_CARE_PROVIDER_SITE_OTHER): Payer: Managed Care, Other (non HMO) | Admitting: General Surgery

## 2011-05-16 ENCOUNTER — Other Ambulatory Visit: Payer: Self-pay | Admitting: Adult Health

## 2011-05-16 DIAGNOSIS — E041 Nontoxic single thyroid nodule: Secondary | ICD-10-CM

## 2011-05-18 ENCOUNTER — Other Ambulatory Visit (HOSPITAL_COMMUNITY): Payer: Managed Care, Other (non HMO)

## 2011-05-19 ENCOUNTER — Ambulatory Visit (HOSPITAL_COMMUNITY)
Admission: RE | Admit: 2011-05-19 | Discharge: 2011-05-19 | Disposition: A | Discharge status: Home / Self Care | Payer: Managed Care, Other (non HMO) | Source: Ambulatory Visit | Attending: Adult Health | Admitting: Adult Health

## 2011-05-19 DIAGNOSIS — E041 Nontoxic single thyroid nodule: Secondary | ICD-10-CM

## 2011-05-19 DIAGNOSIS — E049 Nontoxic goiter, unspecified: Secondary | ICD-10-CM | POA: Insufficient documentation

## 2011-07-19 IMAGING — MG MM DIGITAL DIAGNOSTIC UNILAT R
2 series · 2 of 2 positions shown · non-contrast
Comparison: 09/22/2009

CLINICAL DATA: Ultrasound guided right breast biopsy - evaluate
clip placement.

DIGITAL DIAGNOSTIC RIGHT MAMMOGRAM

[R CC]
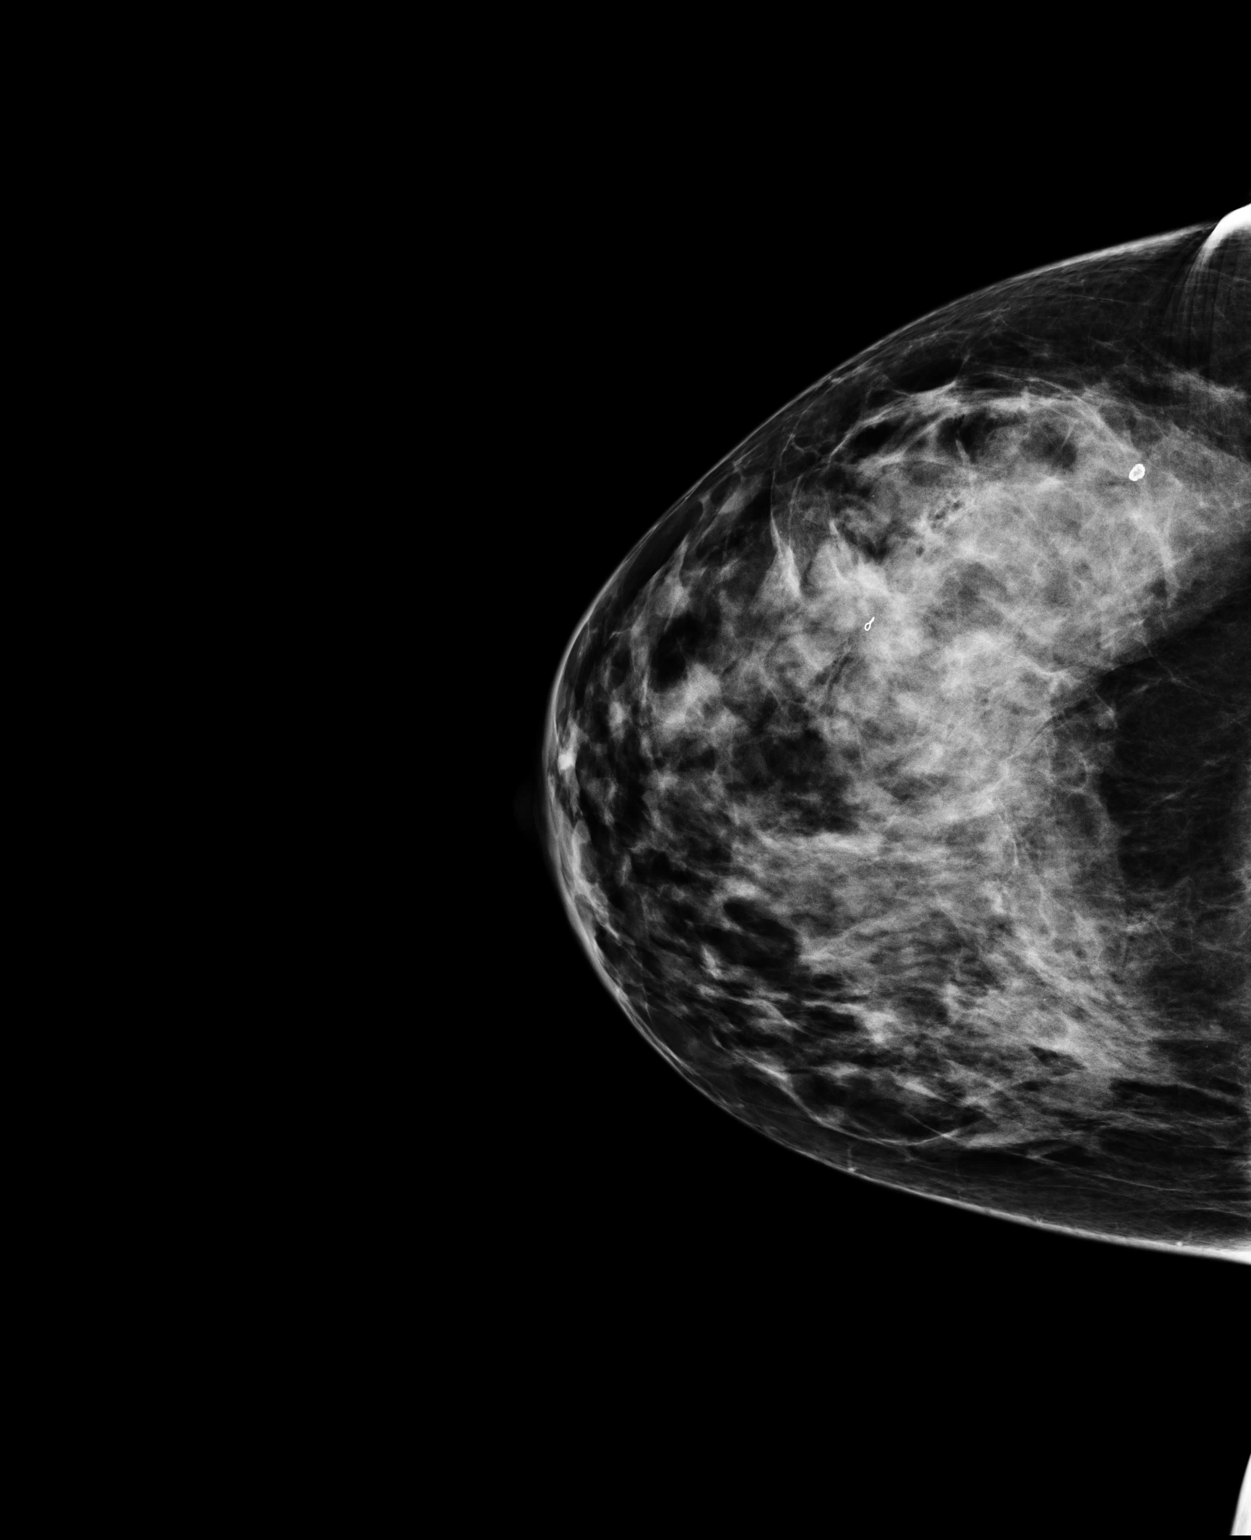

[R ML]
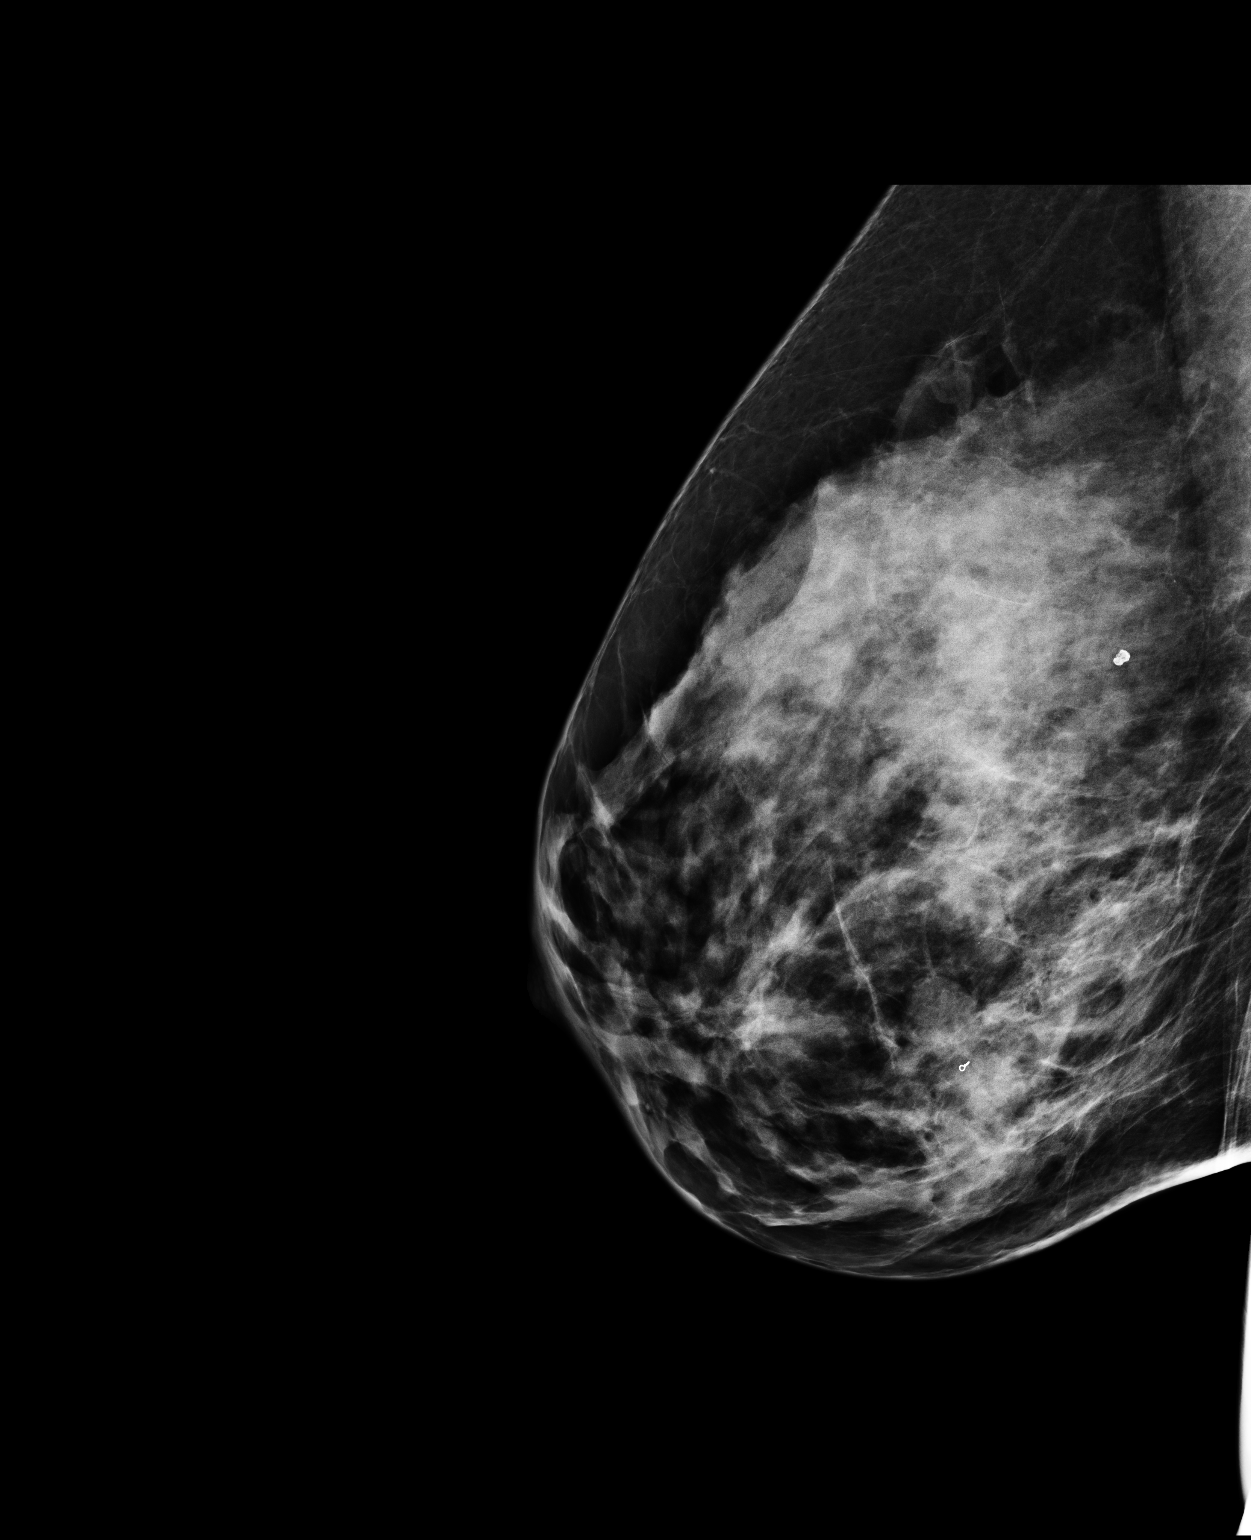

[2 of 2 positions shown; findings below may reference images not displayed]

FINDINGS: Films are performed following ultrasound guided biopsy
of right breast biopsy  The biopsy clip is in satisfactory position
in the outer lower right breast.
IMPRESSION: Satisfactory clip placement following ultrasound guided right
breast biopsy.

Pathology will be followed.

## 2011-08-07 ENCOUNTER — Other Ambulatory Visit: Payer: Self-pay | Admitting: Neurosurgery

## 2011-08-08 ENCOUNTER — Telehealth (HOSPITAL_COMMUNITY): Payer: Self-pay

## 2011-08-08 ENCOUNTER — Encounter (HOSPITAL_COMMUNITY): Payer: Self-pay | Admitting: Oncology

## 2011-08-08 ENCOUNTER — Encounter (HOSPITAL_COMMUNITY): Payer: Managed Care, Other (non HMO) | Attending: Oncology | Admitting: Oncology

## 2011-08-08 VITALS — BP 130/69 | HR 68 | Temp 97.7°F | Ht 65.0 in | Wt 179.6 lb

## 2011-08-08 DIAGNOSIS — M47817 Spondylosis without myelopathy or radiculopathy, lumbosacral region: Secondary | ICD-10-CM | POA: Insufficient documentation

## 2011-08-08 DIAGNOSIS — E781 Pure hyperglyceridemia: Secondary | ICD-10-CM | POA: Insufficient documentation

## 2011-08-08 DIAGNOSIS — C50519 Malignant neoplasm of lower-outer quadrant of unspecified female breast: Secondary | ICD-10-CM

## 2011-08-08 DIAGNOSIS — C50919 Malignant neoplasm of unspecified site of unspecified female breast: Secondary | ICD-10-CM

## 2011-08-08 DIAGNOSIS — Z170 Estrogen receptor positive status [ER+]: Secondary | ICD-10-CM

## 2011-08-08 LAB — DIFFERENTIAL
Basophils Absolute: 0 10*3/uL (ref 0.0–0.1)
Lymphocytes Relative: 42 % (ref 12–46)
Monocytes Absolute: 0.4 10*3/uL (ref 0.1–1.0)
Monocytes Relative: 7 % (ref 3–12)
Neutro Abs: 2.6 10*3/uL (ref 1.7–7.7)
Neutrophils Relative %: 49 % (ref 43–77)

## 2011-08-08 LAB — CBC
HCT: 38.7 % (ref 36.0–46.0)
Hemoglobin: 13.4 g/dL (ref 12.0–15.0)
WBC: 5.3 10*3/uL (ref 4.0–10.5)

## 2011-08-08 LAB — COMPREHENSIVE METABOLIC PANEL
AST: 24 U/L (ref 0–37)
Albumin: 3.7 g/dL (ref 3.5–5.2)
Alkaline Phosphatase: 47 U/L (ref 39–117)
CO2: 26 mEq/L (ref 19–32)
Chloride: 103 mEq/L (ref 96–112)
Creatinine, Ser: 0.69 mg/dL (ref 0.50–1.10)
GFR calc non Af Amer: >90 mL/min (ref >90)
Potassium: 3.9 mEq/L (ref 3.5–5.1)
Total Bilirubin: 0.5 mg/dL (ref 0.3–1.2)

## 2011-08-08 LAB — LIPID PANEL
LDL Cholesterol: 91 mg/dL (ref 0–99)
Triglycerides: 240 mg/dL — ABNORMAL HIGH (ref <150)
VLDL: 48 mg/dL — ABNORMAL HIGH (ref 0–40)

## 2011-08-08 NOTE — Telephone Encounter (Signed)
Per request of MD, patient notified that triglycerides are more elevated and he would like for her to try to lose 20 lbs over the next 6 months.  Patient wishes to try to lose weight by changing diet and increasing activity.  Will contact us if she wants to have nutritional consult.

## 2011-08-08 NOTE — Patient Instructions (Signed)
Vanessa Neal  [id number] January 12, 1961   [facility]  Discharge Instructions  RECOMMENDATIONS MADE BY THE CONSULTANT AND ANY TEST RESULTS WILL BE SENT TO YOUR REFERRING DOCTOR.   EXAM FINDINGS BY MD TODAY AND SIGNS AND SYMPTOMS TO REPORT TO CLINIC OR PRIMARY MD: You are doing great.  May want to do some squatting exercises to help the pelvic muscles and thighs and some bending exercises to stretch out your lower back.  After you get home from work each day you can try doing some stretching exercises to stretch your muscles.  MEDICATIONS PRESCRIBED: none   INSTRUCTIONS GIVEN AND DISCUSSED: Other :  Any new lumps, bone pain or shortness of breath.  SPECIAL INSTRUCTIONS/FOLLOW-UP: Return to Clinic in 5 months for follow-up.   I acknowledge that I have been informed and understand all the instructions given to me and received a copy. I do not have any more questions at this time, but understand that I may call the Specialty Clinic at [facility] at [phone] during business hours should I have any further questions or need assistance in obtaining follow-up care.    __________________________________________  _____________  __________ Signature of Patient or Authorized Representative            Date                   Time    __________________________________________ Nurse's Signature

## 2011-08-08 NOTE — Progress Notes (Signed)
CC:   Vanessa Neal, M.D. Vanessa Neal, M.D. Vanessa Lint, MD Vanessa Neal, M.D.  DIAGNOSES: 1. Stage IIA right-sided infiltrating ductal carcinoma of the breast     with a tumor greater than 2.5 cm by mammography, 3 cm by MRI, ER     positive, PR positive, with her ER receptor 75%, PR receptor 97%,     Ki-67 marker 85%, HER-2/neu negative on her initial biopsy.  A     repeat was done on her primary and still showed ER positivity at     98%, PR positive at 57%, HER-2/neu was again negative, Ki-67 marker     was 85%.  She received dose-dense AC, followed by Taxol weekly x12.     She had a tremendous reduction in the size of the tumor with this     neoadjuvant therapy.  Pathologically she had at 1.1 cm tumor left.     Three sentinel nodes were negative.  No lymphovascular invasion was     seen.  Margins are clear and she had this definitive lumpectomy and     sentinel node biopsies on 04/13/2010, then went on to receive     radiation therapy, finishing in early February 2012 and she started     tamoxifen on 07/05/2010 consisting of 20 mg a day. 2. BRCA1 and BRCA2 negativity. 3. Hypertriglyceridemia. 4. 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 Vanessa Neal has a little aching in her back after a day's work up on her feet. That is about her only complaint.  She did want [place] to check her lipids, which we have done.  She fasted today.  REVIEW OF SYSTEMS:  Her review of systems oncologic otherwise is negative.  PHYSICAL EXAMINATION:  General:  She looks very stable.  Vital Signs: Her weight is still too high for her height.  It is 179 pounds on a 5 foot 5 inch frame.  BMI is 29.9.  Blood pressure 130/69, sitting position.  Temperature is normal.  Blood pressure as mentioned above. Pulse 68 and regular.  She is in no acute distress.  Breasts:  The breast exam shows a dimpling area in the right axilla and a little bit of dimpling in the lower outer  quadrant, a little bit of scar tissue there, about 1 cm x 2 cm.  No obvious masses in either breast otherwise. Heart:  Shows a regular rhythm and rate without murmur, rub, or gallop. Lymph:  Adenopathy is not appreciated in any location.  Abdomen:  Soft and nontender without organomegaly.  Extremities:  She has no peripheral edema.  So we will see her back in 6 months, but we will get results of her lipids and talk to her after that.    ______________________________ Ladona Horns. Mariel Sleet, MD ESN/MEDQ  D:  08/08/2011  T:  08/08/2011  Job:  [id number]

## 2011-08-08 NOTE — Progress Notes (Signed)
This office note has been dictated.

## 2011-08-22 ENCOUNTER — Ambulatory Visit (HOSPITAL_COMMUNITY): Payer: Managed Care, Other (non HMO) | Admitting: Oncology

## 2011-09-06 ENCOUNTER — Other Ambulatory Visit (HOSPITAL_COMMUNITY): Payer: Self-pay | Admitting: Oncology

## 2011-09-06 DIAGNOSIS — C50919 Malignant neoplasm of unspecified site of unspecified female breast: Secondary | ICD-10-CM

## 2011-09-06 MED ORDER — VENLAFAXINE HCL 37.5 MG PO TABS: 37.5000 mg | ORAL_TABLET | Freq: Two times a day (BID) | ORAL | Status: DC

## 2011-11-28 ENCOUNTER — Other Ambulatory Visit (HOSPITAL_COMMUNITY): Payer: Self-pay | Admitting: Oncology

## 2011-11-28 DIAGNOSIS — C50919 Malignant neoplasm of unspecified site of unspecified female breast: Secondary | ICD-10-CM

## 2011-11-28 MED ORDER — VENLAFAXINE HCL 37.5 MG PO TABS: 37.5000 mg | ORAL_TABLET | Freq: Two times a day (BID) | ORAL | Status: DC

## 2012-01-28 HISTORY — PX: BREAST CYST ASPIRATION: SHX578

## 2012-02-06 ENCOUNTER — Encounter (HOSPITAL_COMMUNITY): Payer: Self-pay | Admitting: Oncology

## 2012-02-06 ENCOUNTER — Other Ambulatory Visit (HOSPITAL_COMMUNITY): Payer: Self-pay | Admitting: Oncology

## 2012-02-06 ENCOUNTER — Encounter (HOSPITAL_COMMUNITY): Payer: Managed Care, Other (non HMO) | Attending: Oncology | Admitting: Oncology

## 2012-02-06 VITALS — BP 102/72 | HR 85 | Temp 97.9°F | Resp 16 | Wt 175.8 lb

## 2012-02-06 DIAGNOSIS — C50519 Malignant neoplasm of lower-outer quadrant of unspecified female breast: Secondary | ICD-10-CM

## 2012-02-06 DIAGNOSIS — C50919 Malignant neoplasm of unspecified site of unspecified female breast: Secondary | ICD-10-CM

## 2012-02-06 DIAGNOSIS — Z170 Estrogen receptor positive status [ER+]: Secondary | ICD-10-CM

## 2012-02-06 NOTE — Patient Instructions (Addendum)
Vanessa Neal  DOB 18-Oct-1960 CSN [id number]  MRN [id number] Dr. Glenford Peers  [facility]  Discharge Instructions  RECOMMENDATIONS MADE BY THE CONSULTANT AND ANY TEST RESULTS WILL BE SENT TO YOUR REFERRING DOCTOR.   EXAM FINDINGS BY MD TODAY AND SIGNS AND SYMPTOMS TO REPORT TO CLINIC OR PRIMARY MD: Exam good today.   MEDICATIONS PRESCRIBED: Continue taking Tamoxifen.   INSTRUCTIONS GIVEN AND DISCUSSED:Return to clinic in 6 months.   SPECIAL INSTRUCTIONS/FOLLOW-UP:Schedule mammogram before you leave today. Have any lab work results faxed to our office.    I acknowledge that I have been informed and understand all the instructions given to me and received a copy. I do not have any more questions at this time, but understand that I may call the Specialty Clinic at [facility] at [phone] during business hours should I have any further questions or need assistance in obtaining follow-up care.    __________________________________________  _____________  __________ Signature of Patient or Authorized Representative            Date                   Time    __________________________________________ Nurse's Signature

## 2012-02-06 NOTE — Progress Notes (Signed)
Vanessa Ruths, MD [address] [place] [address]  1. Breast CA     CURRENT THERAPY: On Tamoxifen 20 daily,   INTERVAL HISTORY: Vanessa Neal 51 y.o. female returns for  regular  visit for followup of Stage IIA right-sided infiltrating ductal carcinoma of the breast with a tumor greater than 2.5 cm by mammography, 3 cm by MRI, ER positive, PR positive, with her ER receptor 75%, PR receptor 97%, Ki-67 marker 85%, HER-2/neu negative on her initial biopsy. A repeat was done on her primary and still showed ER positivity at 98%, PR positive at 57%, HER-2/neu was again negative, Ki-67 marker was 85%. She received dose-dense AC, followed by Taxol weekly x12. She had a tremendous reduction in the size of the tumor with this neoadjuvant therapy. Pathologically she had at 1.1 cm tumor left. Three sentinel nodes were negative. No lymphovascular invasion was seen. Margins are clear and she had this definitive lumpectomy and sentinel node biopsies on 04/13/2010, then went on to receive radiation therapy, finishing in early February 2012 and she started tamoxifen on 07/05/2010 consisting of 20 mg a day.    Vanessa Neal is doing well.  She reports that the Hot flashes are improving but remain.  She is taking Effexor BID for this and it is effective.  She reports that she went 1 day without taking it to see if the Effexor is working and she explains that her hot flashes were much worse that day.    She is on her feet all day and is working.  She reports that sometime her LE are sore but this may be from being on her feet all day.   We briefly discussed Tamoxifen for 5 years versus 10 years.  At this time we will continue to recommend 5 years of tamoxifen until those studies develop more.  She is agreeable to this.  We discussed the risks associated with Tamoxifen including blood clots and increased risk of endometrial cancer.   She is a little overdue for a mammogram.  On her way out  today, she will make this appointment and have it done within the next 3 weeks.  We will keep an eye out for these results.    Complete ROS questioning is negative.  Past Medical History  Diagnosis Date  . Breast cancer   . Ovarian cyst     has Breast CA on her problem list.      has no known allergies.  Vanessa Neal does not currently have medications on file.  Past Surgical History  Procedure Date  . Cholecystectomy   . Breast lumpectomy     right  . Cesarean section     x 3  . Breast cyst excision   . Lipoma excision     from back    Denies any headaches, dizziness, double vision, fevers, chills, night sweats, nausea, vomiting, diarrhea, constipation, chest pain, heart palpitations, shortness of breath, blood in stool, black tarry stool, urinary pain, urinary burning, urinary frequency, hematuria.   PHYSICAL EXAMINATION  ECOG PERFORMANCE STATUS: 0 - Asymptomatic  Filed Vitals:   02/06/12 1034  BP: 102/72  Pulse: 85  Temp: 97.9 F (36.6 C)  Resp: 16    GENERAL:alert, no distress, well nourished, well developed, comfortable, cooperative and smiling SKIN: skin color, texture, turgor are normal, no rashes or significant lesions HEAD: Normocephalic, No masses, lesions, tenderness or abnormalities EYES: normal, Conjunctiva are pink and non-injected EARS: External ears normal OROPHARYNX:lips,  buccal mucosa, and tongue normal and mucous membranes are moist  NECK: supple, no adenopathy, thyroid normal size, non-tender, without nodularity, no stridor, non-tender, trachea midline LYMPH:  no palpable lymphadenopathy, no hepatosplenomegaly BREAST:right breast normal with a small hematoma like mass measuring 1.5 cm in the 6-7 o'clock position that the patient reports has been there ever since her lumpectomy, otherwise, no mass, skin or nipple changes or axillary nodes, left breast normal without mass, skin or nipple changes or axillary nodes LUNGS: clear to auscultation and  percussion HEART: regular rate & rhythm, no murmurs, no gallops, S1 normal and S2 normal ABDOMEN:abdomen soft, non-tender, normal bowel sounds, no masses or organomegaly and no hepatosplenomegaly BACK: Back symmetric, no curvature., No CVA tenderness EXTREMITIES:less then 2 second capillary refill, no joint deformities, effusion, or inflammation, no edema, no skin discoloration, no clubbing, no cyanosis  NEURO: alert & oriented x 3 with fluent speech, no focal motor/sensory deficits, gait normal   ASSESSMENT:  1. Stage IIA right-sided infiltrating ductal carcinoma of the breast with a tumor greater than 2.5 cm by mammography, 3 cm by MRI, ER positive, PR positive, with her ER receptor 75%, PR receptor 97%, Ki-67 marker 85%, HER-2/neu negative on her initial biopsy. A repeat was done on her primary and still showed ER positivity at 98%, PR positive at 57%, HER-2/neu was again negative, Ki-67 marker was 85%. She received dose-dense AC, followed by Taxol weekly x12. She had a tremendous reduction in the size of the tumor with this neoadjuvant therapy. Pathologically she had at 1.1 cm tumor left. Three sentinel nodes were negative. No lymphovascular invasion was seen. Margins are clear and she had this definitive lumpectomy and sentinel node biopsies on 04/13/2010, then went on to receive radiation therapy, finishing in early February 2012 and she started tamoxifen on 07/05/2010 consisting of 20 mg a day.  2. BRCA1 and BRCA2 negativity.  3. Hypertriglyceridemia.  4. 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   PLAN:  1. Rx for lab work at her PCP: CBC diff, CMET, lipid panel. 2. Mammogram within the next 2-3 weeks.  3. Continue with Tamoxifen daily as ordered.  We briefly discussed Tamoxifen for 5 years versus 10 years.  Our recommendation is that she take 5 years of Tamoxifen at this time until the studies on this subject develop more.  4. Return in 6 months for  follow-up.  We will keep an eye out for her mammogram results.    All questions were answered. The patient knows to call the clinic with any problems, questions or concerns. We can certainly see the patient much sooner if necessary.  Stanly Si

## 2012-02-15 ENCOUNTER — Other Ambulatory Visit: Payer: Self-pay | Admitting: Adult Health

## 2012-02-15 ENCOUNTER — Other Ambulatory Visit (HOSPITAL_COMMUNITY)
Admission: RE | Admit: 2012-02-15 | Discharge: 2012-02-15 | Disposition: A | Discharge status: Home / Self Care | Payer: Managed Care, Other (non HMO) | Source: Ambulatory Visit | Attending: Obstetrics and Gynecology | Admitting: Obstetrics and Gynecology

## 2012-02-15 DIAGNOSIS — Z01419 Encounter for gynecological examination (general) (routine) without abnormal findings: Secondary | ICD-10-CM | POA: Insufficient documentation

## 2012-02-15 DIAGNOSIS — Z1151 Encounter for screening for human papillomavirus (HPV): Secondary | ICD-10-CM | POA: Insufficient documentation

## 2012-02-15 DIAGNOSIS — E041 Nontoxic single thyroid nodule: Secondary | ICD-10-CM

## 2012-02-15 DIAGNOSIS — E049 Nontoxic goiter, unspecified: Secondary | ICD-10-CM

## 2012-02-21 ENCOUNTER — Ambulatory Visit (HOSPITAL_COMMUNITY)
Admission: RE | Admit: 2012-02-21 | Discharge: 2012-02-21 | Disposition: A | Discharge status: Home / Self Care | Payer: Managed Care, Other (non HMO) | Source: Ambulatory Visit | Attending: Adult Health | Admitting: Adult Health

## 2012-02-21 ENCOUNTER — Other Ambulatory Visit (HOSPITAL_COMMUNITY): Payer: Self-pay | Admitting: Oncology

## 2012-02-21 ENCOUNTER — Ambulatory Visit (HOSPITAL_COMMUNITY)
Admission: RE | Admit: 2012-02-21 | Discharge: 2012-02-21 | Disposition: A | Discharge status: Home / Self Care | Payer: Managed Care, Other (non HMO) | Source: Ambulatory Visit | Attending: Oncology | Admitting: Oncology

## 2012-02-21 DIAGNOSIS — Z853 Personal history of malignant neoplasm of breast: Secondary | ICD-10-CM | POA: Insufficient documentation

## 2012-02-21 DIAGNOSIS — C50919 Malignant neoplasm of unspecified site of unspecified female breast: Secondary | ICD-10-CM

## 2012-02-21 DIAGNOSIS — N6009 Solitary cyst of unspecified breast: Secondary | ICD-10-CM

## 2012-02-21 DIAGNOSIS — E049 Nontoxic goiter, unspecified: Secondary | ICD-10-CM

## 2012-02-21 DIAGNOSIS — E041 Nontoxic single thyroid nodule: Secondary | ICD-10-CM

## 2012-02-22 ENCOUNTER — Other Ambulatory Visit: Payer: Self-pay

## 2012-02-22 DIAGNOSIS — Z139 Encounter for screening, unspecified: Secondary | ICD-10-CM

## 2012-02-27 ENCOUNTER — Telehealth: Payer: Self-pay

## 2012-02-27 MED ORDER — PEG-KCL-NACL-NASULF-NA ASC-C 100 G PO SOLR: 1.0000 | ORAL | Status: DC

## 2012-02-27 NOTE — Telephone Encounter (Signed)
MOVI PREP SPLIT DOSING, REGULAR BREAKFAST. CLEAR LIQUIDS AFTER 9 AM.  

## 2012-02-27 NOTE — Telephone Encounter (Signed)
Gastroenterology Pre-Procedure Form   Pt's husband, Onalee Hua , called and gave the info and scheduled the appt.    Request Date: 02/23/2012       Requesting Physician: Cyril Mourning, NP     PATIENT INFORMATION:  Vanessa Neal is a 51 y.o., female (DOB=June 22, 1960).  PROCEDURE: Procedure(s) requested: colonoscopy Procedure Reason: screening for colon cancer  PATIENT REVIEW QUESTIONS: The patient reports the following:   1. Diabetes Melitis: no 2. Joint replacements in the past 12 months: no 3. Major health problems in the past 3 months: no 4. Has an artificial valve or MVP:no 5. Has been advised in past to take antibiotics in advance of a procedure like teeth cleaning: no}    MEDICATIONS & ALLERGIES:    Patient reports the following regarding taking any blood thinners:   Plavix? no Aspirin?no Coumadin?  no  Patient confirms/reports the following medications:  Current Outpatient Prescriptions  Medication Sig Dispense Refill  . diphenhydramine-acetaminophen (TYLENOL PM) 25-500 MG TABS Take 1 tablet by mouth at bedtime as needed. Only occasionally      . ibuprofen (ADVIL,MOTRIN) 200 MG tablet Take 200 mg by mouth every 6 (six) hours as needed. Occasionally      . Multiple Vitamins-Calcium (ONE-A-DAY WOMENS PO) Take by mouth daily.      . tamoxifen (NOLVADEX) 20 MG tablet Take 20 mg by mouth daily.        Marland Kitchen venlafaxine (EFFEXOR) 37.5 MG tablet Take 1 tablet (37.5 mg total) by mouth 2 (two) times daily.  60 tablet  2  . calcium-vitamin D (OSCAL WITH D) 500-200 MG-UNIT per tablet Take 1 tablet by mouth 2 (two) times daily.         Patient confirms/reports the following allergies:  No Known Allergies  Patient is appropriate to schedule for requested procedure(s): yes  AUTHORIZATION INFORMATION Primary Insurance:   ID #:  Group #:  Pre-Cert / Auth required: Pre-Cert / Auth #:   Secondary Insurance:   ID #:  Group #:  Pre-Cert / Auth required:  Pre-Cert / Auth #:   No orders  of the defined types were placed in this encounter.    SCHEDULE INFORMATION: Procedure has been scheduled as follows:  Date: 03/11/2012      Time: 9:30 AM  Location: [facility] Short Stay  This Gastroenterology Pre-Precedure Form is being routed to the following provider(s) for review: Jonette Eva, MD

## 2012-02-27 NOTE — Telephone Encounter (Signed)
Rx sent to [facility] and instructions mailed to pt.  

## 2012-02-28 ENCOUNTER — Telehealth: Payer: Self-pay

## 2012-02-28 ENCOUNTER — Other Ambulatory Visit (HOSPITAL_COMMUNITY): Payer: Self-pay | Admitting: Oncology

## 2012-02-28 ENCOUNTER — Ambulatory Visit (HOSPITAL_COMMUNITY)
Admission: RE | Admit: 2012-02-28 | Discharge: 2012-02-28 | Disposition: A | Discharge status: Home / Self Care | Payer: Managed Care, Other (non HMO) | Source: Ambulatory Visit | Attending: Oncology | Admitting: Oncology

## 2012-02-28 DIAGNOSIS — Z853 Personal history of malignant neoplasm of breast: Secondary | ICD-10-CM | POA: Insufficient documentation

## 2012-02-28 DIAGNOSIS — N6009 Solitary cyst of unspecified breast: Secondary | ICD-10-CM

## 2012-02-28 DIAGNOSIS — IMO0001 Reserved for inherently not codable concepts without codable children: Secondary | ICD-10-CM

## 2012-02-28 NOTE — Telephone Encounter (Signed)
I called [employer] at [phone] and spoke to [facility]. She said no PA is required for the Screening colonoscopy.

## 2012-02-28 NOTE — Progress Notes (Signed)
Lidocaine 2%           3mL injected               Right breast aspiration performed

## 2012-03-05 ENCOUNTER — Encounter (HOSPITAL_COMMUNITY): Payer: Self-pay | Admitting: Pharmacy Technician

## 2012-03-11 ENCOUNTER — Encounter (HOSPITAL_COMMUNITY)
Admission: RE | Disposition: A | Discharge status: Home / Self Care | Payer: Self-pay | Source: Ambulatory Visit | Attending: Gastroenterology

## 2012-03-11 ENCOUNTER — Encounter (HOSPITAL_COMMUNITY): Payer: Self-pay | Admitting: *Deleted

## 2012-03-11 ENCOUNTER — Ambulatory Visit (HOSPITAL_COMMUNITY)
Admission: RE | Admit: 2012-03-11 | Discharge: 2012-03-11 | Disposition: A | Discharge status: Home / Self Care | Payer: Managed Care, Other (non HMO) | Source: Ambulatory Visit | Attending: Gastroenterology | Admitting: Gastroenterology

## 2012-03-11 DIAGNOSIS — K648 Other hemorrhoids: Secondary | ICD-10-CM | POA: Insufficient documentation

## 2012-03-11 DIAGNOSIS — Z1211 Encounter for screening for malignant neoplasm of colon: Secondary | ICD-10-CM | POA: Insufficient documentation

## 2012-03-11 DIAGNOSIS — Z139 Encounter for screening, unspecified: Secondary | ICD-10-CM

## 2012-03-11 HISTORY — PX: COLONOSCOPY: SHX5424

## 2012-03-11 SURGERY — COLONOSCOPY
Anesthesia: Moderate Sedation

## 2012-03-11 MED ORDER — MEPERIDINE HCL 100 MG/ML IJ SOLN
INTRAMUSCULAR | Status: DC | PRN
  Administered 2012-03-11: 50 mg via INTRAVENOUS
  Administered 2012-03-11: 25 mg via INTRAVENOUS

## 2012-03-11 MED ORDER — MIDAZOLAM HCL 5 MG/5ML IJ SOLN
INTRAMUSCULAR | Status: AC
  Filled 2012-03-11: qty 10

## 2012-03-11 MED ORDER — STERILE WATER FOR IRRIGATION IR SOLN
Status: DC | PRN
  Administered 2012-03-11: 10:00:00

## 2012-03-11 MED ORDER — MEPERIDINE HCL 50 MG/ML IJ SOLN
INTRAMUSCULAR | Status: AC
  Filled 2012-03-11: qty 1

## 2012-03-11 MED ORDER — SODIUM CHLORIDE 0.45 % IV SOLN
INTRAVENOUS | Status: DC
  Administered 2012-03-11: 09:00:00 via INTRAVENOUS

## 2012-03-11 MED ORDER — MIDAZOLAM HCL 5 MG/5ML IJ SOLN
INTRAMUSCULAR | Status: DC | PRN
  Administered 2012-03-11 (×2): 2 mg via INTRAVENOUS

## 2012-03-11 NOTE — H&P (Signed)
  Primary Care Physician:  Kirk Ruths, MD Primary Gastroenterologist:  Dr. Darrick Penna  Pre-Procedure History & Physical: HPI:  Vanessa Neal is a 51 y.o. female here for COLON CANCER SCREENING.  Past Medical History  Diagnosis Date  . Breast cancer   . Ovarian cyst     Past Surgical History  Procedure Date  . Cholecystectomy   . Breast lumpectomy     right  . Cesarean section     X 2  . Breast cyst excision   . Lipoma excision     from back    Prior to Admission medications   Medication Sig Start Date End Date Taking? Authorizing Provider  calcium-vitamin D (OSCAL WITH D) 500-200 MG-UNIT per tablet Take 1 tablet by mouth 2 (two) times daily.    Yes Historical Provider, MD  diphenhydramine-acetaminophen (TYLENOL PM) 25-500 MG TABS Take 1 tablet by mouth at bedtime as needed. Only occasionally   Yes Historical Provider, MD  Multiple Vitamins-Calcium (ONE-A-DAY WOMENS PO) Take by mouth daily.   Yes Historical Provider, MD  tamoxifen (NOLVADEX) 20 MG tablet Take 20 mg by mouth daily.     Yes Historical Provider, MD  venlafaxine (EFFEXOR) 37.5 MG tablet Take 1 tablet (37.5 mg total) by mouth 2 (two) times daily. 11/28/11  Yes Ellouise Newer, PA    Allergies as of 02/22/2012  . (No Known Allergies)    Family History  Problem Relation Age of Onset  . Cancer Mother     breast  . Kidney failure Father   . Cancer Maternal Grandmother     colon  . Colon cancer Maternal Grandmother   . Cancer Paternal Grandmother     ovarian    History   Social History  . Marital Status: Married    Spouse Name: N/A    Number of Children: N/A  . Years of Education: N/A   Occupational History  . Not on file.   Social History Main Topics  . Smoking status: Never Smoker   . Smokeless tobacco: Never Used  . Alcohol Use: No  . Drug Use: No  . Sexually Active:    Other Topics Concern  . Not on file   Social History Narrative  . No narrative on file    Review of Systems: See  HPI, otherwise negative ROS   Physical Exam: BP 130/80  Pulse 63  Temp 97.8 F (36.6 C) (Oral)  Resp 22  Ht 5\' 5"  (1.651 m)  Wt 175 lb (79.379 kg)  BMI 29.12 kg/m2  SpO2 92% General:   Alert,  pleasant and cooperative in NAD Head:  Normocephalic and atraumatic. Neck:  Supple; Lungs:  Clear throughout to auscultation.    Heart:  Regular rate and rhythm. Abdomen:  Soft, nontender and nondistended. Normal bowel sounds, without guarding, and without rebound.   Neurologic:  Alert and  oriented x4;  grossly normal neurologically.  Impression/Plan:     SCREENING  Plan:  1. TCS TODAY

## 2012-03-11 NOTE — Op Note (Signed)
[facility] [address] [place], [address]   COLONOSCOPY PROCEDURE REPORT  PATIENT: Vanessa, Neal  MR#: [id number] BIRTHDATE: [birth year] , 51  yrs. old GENDER: Female ENDOSCOPIST: Jonette Eva, MD REFERRED AO:[id number] Regino Schultze, M.D.  Glenford Peers, M.D. PROCEDURE DATE:  03/11/2012 PROCEDURE:   Colonoscopy, screening INDICATIONS:average risk patient for colon cancer. MEDICATIONS: Demerol 75 mg IV and Versed 4 mg IV  DESCRIPTION OF PROCEDURE:    Physical exam was performed.  Informed consent was obtained from the patient after explaining the benefits, risks, and alternatives to procedure.  The patient was connected to monitor and placed in left lateral position. Continuous oxygen was provided by nasal cannula and IV medicine administered through an indwelling cannula.  After administration of sedation and rectal exam, the patients rectum was intubated and the Pentax Colonoscope [phone]  colonoscope was advanced under direct visualization to the cecum.  The scope was removed slowly by carefully examining the color, texture, anatomy, and integrity mucosa on the way out.  The patient was recovered in endoscopy and discharged home in satisfactory condition.       COLON FINDINGS: The colon was otherwise normal.  There was no diverticulosis, inflammation, polyps or cancers unless previously stated and Small internal hemorrhoids were found.  PREP QUALITY: excellent. CECAL W/D TIME: 11 minutes  COMPLICATIONS: None  ENDOSCOPIC IMPRESSION: 1.   The colon was otherwise normal 2.   Small internal hemorrhoids   RECOMMENDATIONS: HIGH FIBER DIET PREP H PRN TCS IN 10 YEARS       _______________________________ eSignedJonette Eva, MD 03/11/2012 11:22 AM

## 2012-03-15 ENCOUNTER — Encounter (HOSPITAL_COMMUNITY): Payer: Self-pay | Admitting: Gastroenterology

## 2012-05-06 ENCOUNTER — Other Ambulatory Visit (HOSPITAL_COMMUNITY): Payer: Self-pay | Admitting: Oncology

## 2012-05-06 DIAGNOSIS — C50919 Malignant neoplasm of unspecified site of unspecified female breast: Secondary | ICD-10-CM

## 2012-05-06 MED ORDER — VENLAFAXINE HCL 37.5 MG PO TABS: 37.5000 mg | ORAL_TABLET | Freq: Two times a day (BID) | ORAL | Status: DC

## 2012-08-05 ENCOUNTER — Ambulatory Visit (HOSPITAL_COMMUNITY): Payer: Managed Care, Other (non HMO) | Admitting: Oncology

## 2012-08-13 ENCOUNTER — Ambulatory Visit (HOSPITAL_COMMUNITY): Payer: Self-pay | Admitting: Oncology

## 2012-08-14 ENCOUNTER — Encounter (HOSPITAL_COMMUNITY): Payer: Self-pay | Admitting: Oncology

## 2012-08-14 ENCOUNTER — Encounter (HOSPITAL_COMMUNITY): Payer: Managed Care, Other (non HMO) | Attending: Oncology | Admitting: Oncology

## 2012-08-14 VITALS — BP 120/69 | HR 83 | Temp 98.3°F | Resp 16 | Wt 184.4 lb

## 2012-08-14 DIAGNOSIS — Z170 Estrogen receptor positive status [ER+]: Secondary | ICD-10-CM

## 2012-08-14 DIAGNOSIS — C50519 Malignant neoplasm of lower-outer quadrant of unspecified female breast: Secondary | ICD-10-CM

## 2012-08-14 DIAGNOSIS — C50911 Malignant neoplasm of unspecified site of right female breast: Secondary | ICD-10-CM

## 2012-08-14 NOTE — Patient Instructions (Addendum)
[facility] Discharge Instructions  RECOMMENDATIONS MADE BY THE CONSULTANT AND ANY TEST RESULTS WILL BE SENT TO YOUR REFERRING PHYSICIAN.  EXAM FINDINGS BY THE PHYSICIAN TODAY AND SIGNS OR SYMPTOMS TO REPORT TO CLINIC OR PRIMARY PHYSICIAN: Exam and discussion by MD.  Do the stretching exercises for your shoulder.  No evidence of recurrence by exam.  We will try to get the labs that were done when seen by GYN.  Continue your tamoxifen.  MEDICATIONS PRESCRIBED:  none  INSTRUCTIONS GIVEN AND DISCUSSED: Report any new lumps, bone pain or shortness of breath.  SPECIAL INSTRUCTIONS/FOLLOW-UP: Follow-up in 6 months.  Thank you for choosing [facility] to provide your oncology and hematology care.  To afford each patient quality time with our providers, please arrive at least 15 minutes before your scheduled appointment time.  With your help, our goal is to use those 15 minutes to complete the necessary work-up to ensure our physicians have the information they need to help with your evaluation and healthcare recommendations.    Effective January 1st, 2014, we ask that you re-schedule your appointment with our physicians should you arrive 10 or more minutes late for your appointment.  We strive to give you quality time with our providers, and arriving late affects you and other patients whose appointments are after yours.    Again, thank you for choosing [facility].  Our hope is that these requests will decrease the amount of time that you wait before being seen by our physicians.       _____________________________________________________________  Should you have questions after your visit to [facility], please contact our office at [phone] between the hours of 8:30 a.m. and 5:00 p.m.  Voicemails left after 4:30 p.m. will not be returned until the following business day.  For prescription refill requests, have your pharmacy contact  our office with your prescription refill request.

## 2012-08-14 NOTE — Progress Notes (Signed)
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 Repeat on the primary after definitive surgery showed ER positivity of 98%, PR 57%, HER-2/neu nonamplified, Ki-67 marker 85%. She received dose dense Adriamycin and Cytoxan, followed by weekly Taxol x12 weeks. She had tremendous improvement in the size of the tumor. Pathologically her tumor was 1.1 cm and 3 sentinel nodes were negative. No LV I was seen. Margins were clear. She had this definitive lumpectomy and sentinel node biopsies on 04/13/2010, went on to receive radiation therapy, started tamoxifen on 07/05/2010 consisting of 20 mg once a day. She'll take that for 5 years and possibly 10 years. We discussed the 10 year versus 5 year data which is positive but the absolute improvement is only a few percentage points so if she does not want to pursue 10 years versus 5 years that is fine at that time. Thus far except the hot flashes she is doing very well with the tamoxifen. She wanted to know again about the side effects including phlebitis issues, DVT issues, uterine cancer issues etc. She is BRCA1 or BRCA2 negative.  She was accompanied by her husband. She is doing very well on oncology review of systems.  Vital signs are stable. Weight is up 5 more pounds unfortunately from last year. She has good range of motion of the right shoulder but states it gets a little stiff with cold damp weather. Again range of motion however is excellent. The right breast shows no masses just a little thickening at the scar and nothing more. Left breast is negative for masses. She has no lymphadenopathy in the supraclavicular infraclavicular cervical, axillary or inguinal areas. She has no true arm or leg edema that I can appreciate. Maybe slightly puffy at best in the upper arm. Lungs are clear to  auscultation and percussion. Heart shows a regular rhythm and rate without murmur rub or gallop. Her hair has regrown completely. She is in no acute distress. Facial symmetry is intact. Abdomen remains soft, nontender, with excess weight but normal bowel sounds. She has no leg edema.  We'll see her back in 6 months. She had blood work at Dr. Rayna Sexton and we will obtain that in the very near future. Presently they cannot locate the laboratory work from within the last 6 weeks. They will continue to look. If they cannot locate it we will have to repeat blood work when she comes back.

## 2012-08-14 NOTE — Progress Notes (Signed)
ADD: Her labs were found from Sept 19,2013 and were excellent including CBC Diff and Cmet, TSH and lipid panel

## 2012-09-04 ENCOUNTER — Ambulatory Visit (HOSPITAL_COMMUNITY)
Admission: RE | Admit: 2012-09-04 | Discharge: 2012-09-04 | Disposition: A | Discharge status: Home / Self Care | Payer: Managed Care, Other (non HMO) | Source: Ambulatory Visit | Attending: Oncology | Admitting: Oncology

## 2012-09-04 DIAGNOSIS — IMO0001 Reserved for inherently not codable concepts without codable children: Secondary | ICD-10-CM

## 2012-09-04 DIAGNOSIS — Z09 Encounter for follow-up examination after completed treatment for conditions other than malignant neoplasm: Secondary | ICD-10-CM | POA: Insufficient documentation

## 2012-09-04 DIAGNOSIS — N6009 Solitary cyst of unspecified breast: Secondary | ICD-10-CM | POA: Insufficient documentation

## 2012-09-24 ENCOUNTER — Other Ambulatory Visit (HOSPITAL_COMMUNITY): Payer: Self-pay | Admitting: Oncology

## 2012-09-24 DIAGNOSIS — C50911 Malignant neoplasm of unspecified site of right female breast: Secondary | ICD-10-CM

## 2012-09-24 MED ORDER — VENLAFAXINE HCL 37.5 MG PO TABS: 37.5000 mg | ORAL_TABLET | Freq: Two times a day (BID) | ORAL | Status: DC

## 2012-12-06 IMAGING — US US SOFT TISSUE HEAD/NECK
1 series · 14 of 25 positions shown · non-contrast
Comparison: 10/12/2005

CLINICAL DATA: Enlarged thyroid.  Nodules.

THYROID ULTRASOUND
TECHNIQUE: Ultrasound examination of the thyroid gland and adjacent
soft tissues was performed.

[Series 1: us soft tissue head/neck · 0.08mm/px · 14 of 52 slices shown]
[im 1/52]
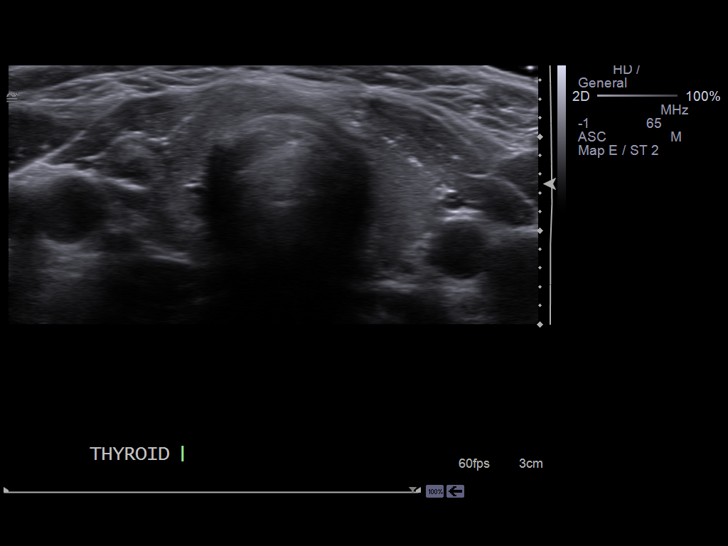
[im 5/52]
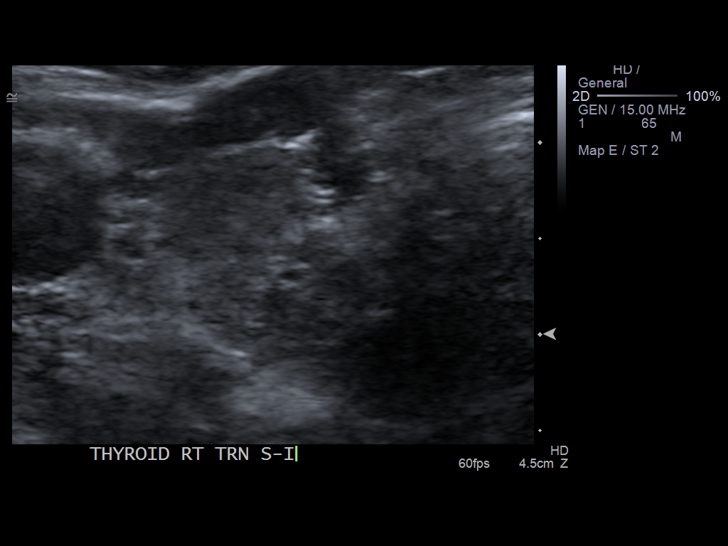
[im 9/52]
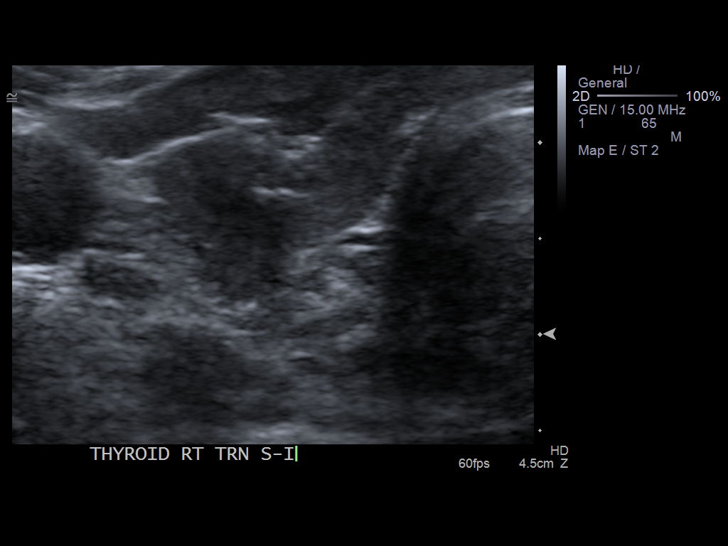
[im 13/52]
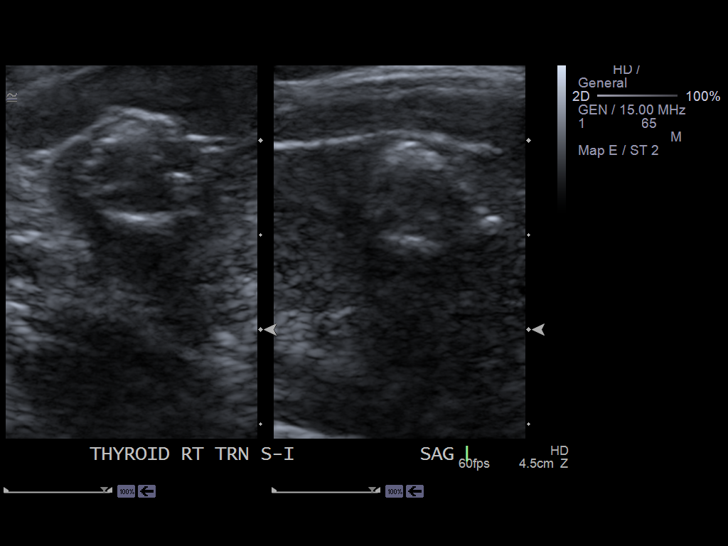
[im 18/52]
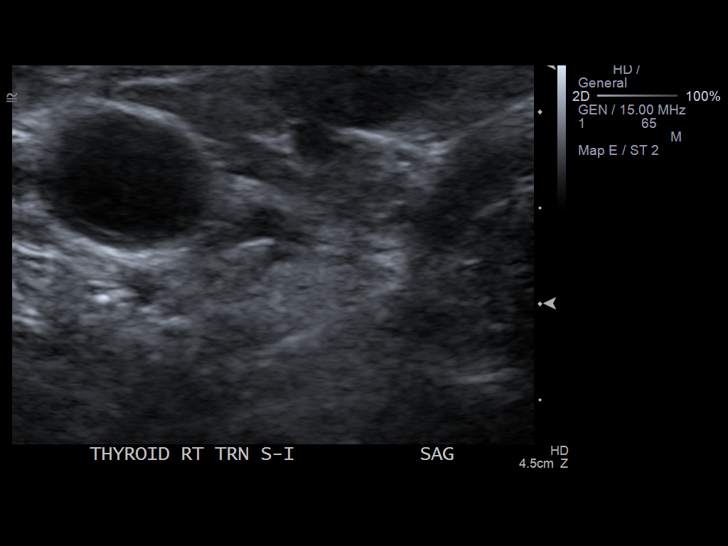
[im 20/52]
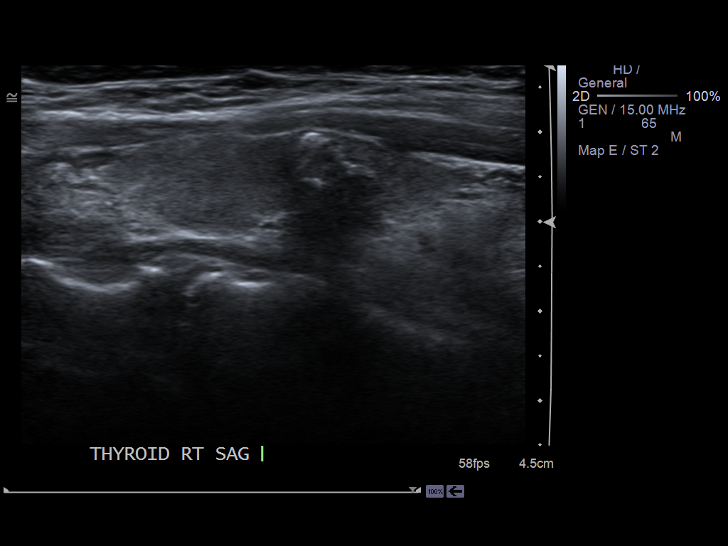
[im 24/52]
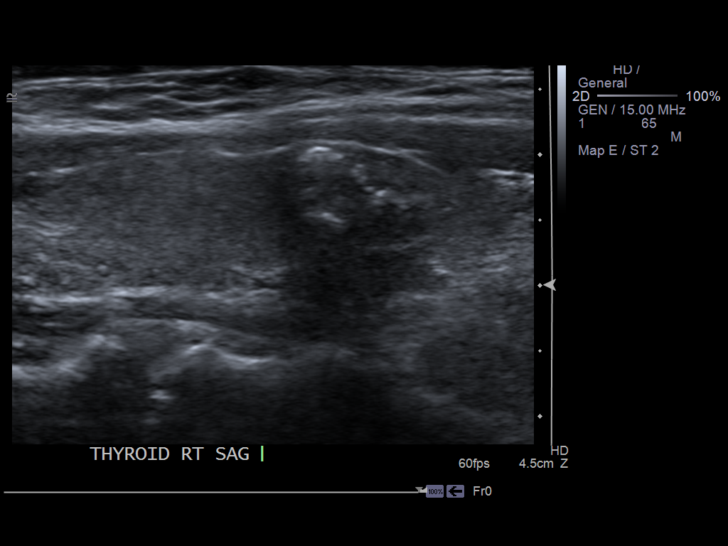
[im 28/52]
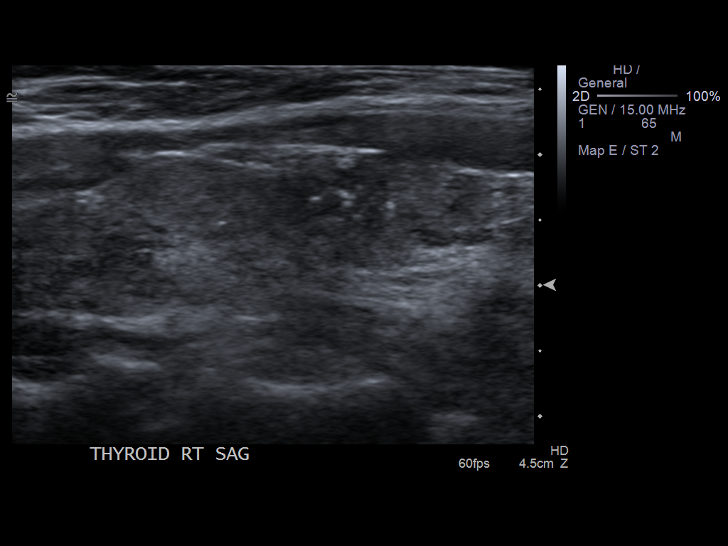
[im 32/52]
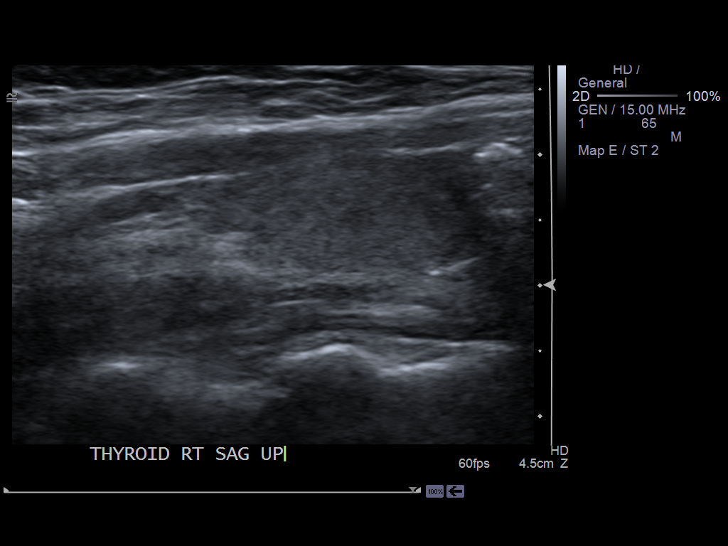
[im 35/52]
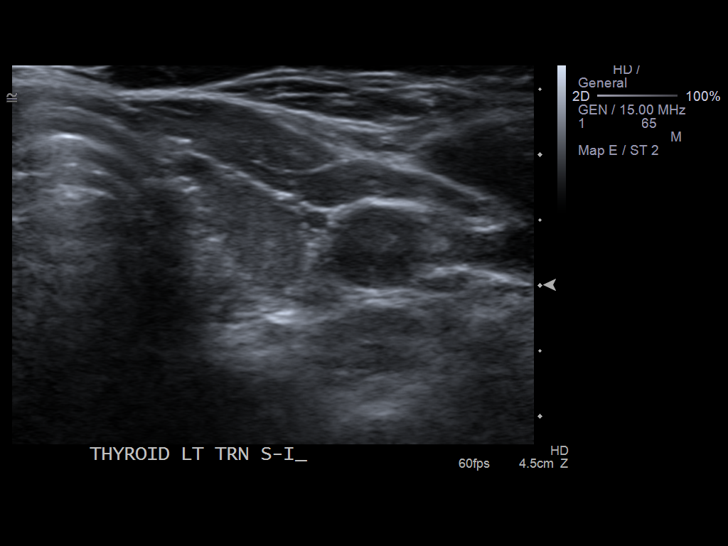
[im 39/52]
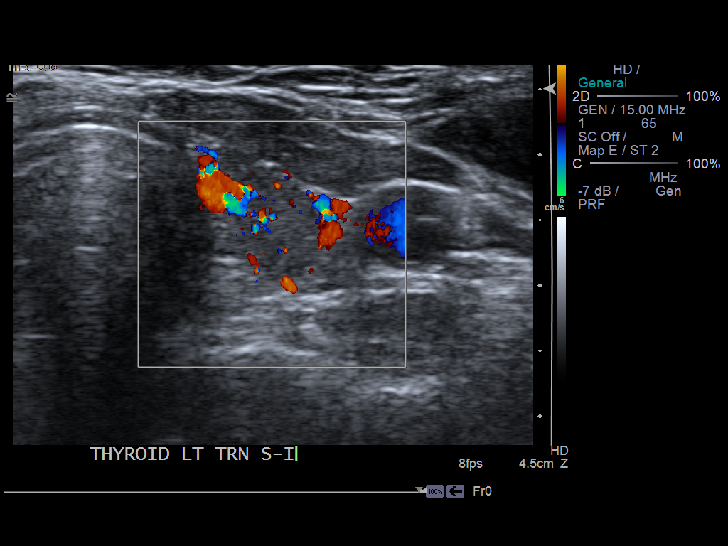
[im 43/52]
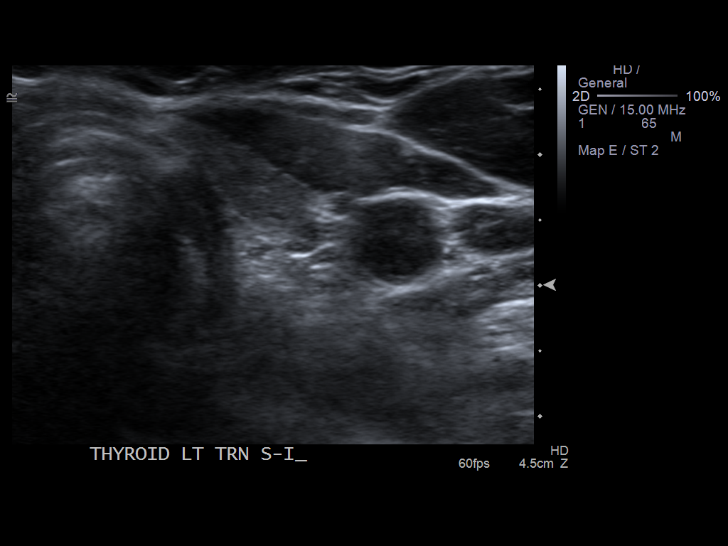
[im 47/52]
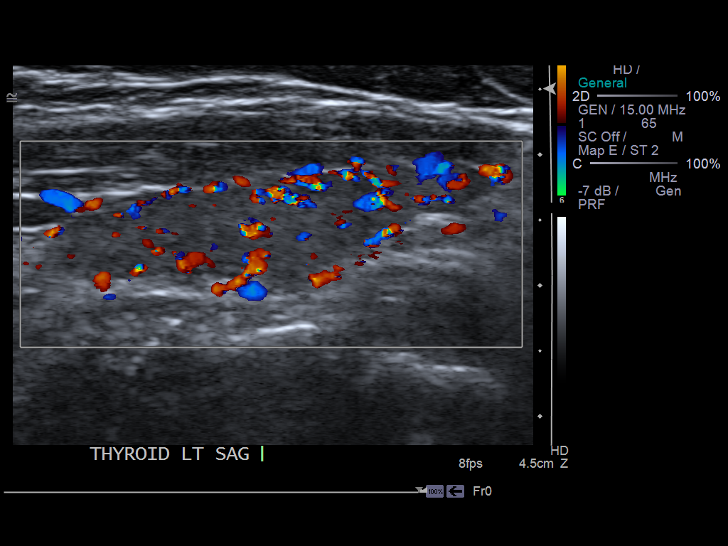
[im 52/52]
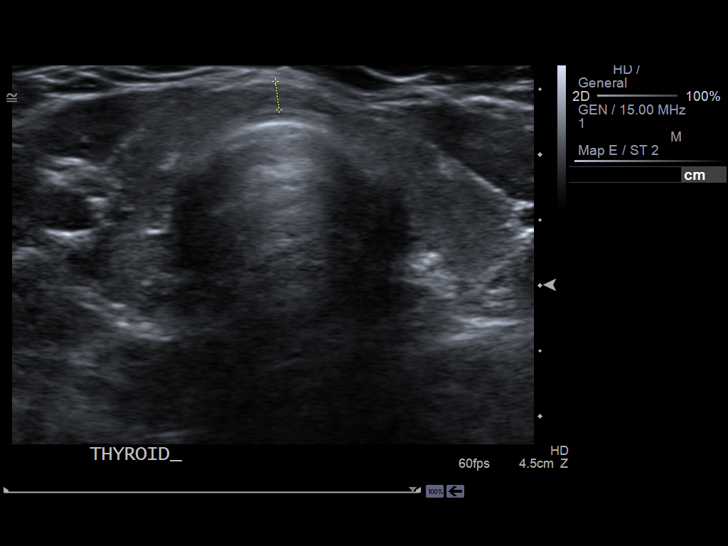

[14 of 25 positions shown; findings below may reference images not displayed]

FINDINGS: Right thyroid lobe:  4.3 x 1.2 x 1.4 cm
Left thyroid lobe:  3.4 x 1.0 x 1.0 cm
Isthmus:  2 mm

Focal nodules:  Thyroid echotexture is mildly heterogeneous
throughout.  In the inter/upper pole region of the right lobe, a 6
x 8 x 6 mm solid, partially calcified lesion is identified.  The
dominant right-sided mass is no longer identified.

No left-sided nodule or mass is seen.

Lymphadenopathy:  Absent
IMPRESSION: 1.  8 mm solid partially calcified right-sided thyroid nodule.
This is indeterminate.  Given underlying calcifications, ultrasound
surveillance should be considered.
2.  Dominant right-sided mass described on the 9334 exam is no
longer identified.

## 2012-12-06 IMAGING — CR DG LUMBAR SPINE COMPLETE 4+V
5 series · 5 of 5 positions shown · non-contrast
Comparison: CT of the abdomen pelvis dated 10/01/2009.

CLINICAL DATA: Back pain and history breast carcinoma.

LUMBAR SPINE - COMPLETE 4+ VIEW

[view not recorded (1 of 5)]
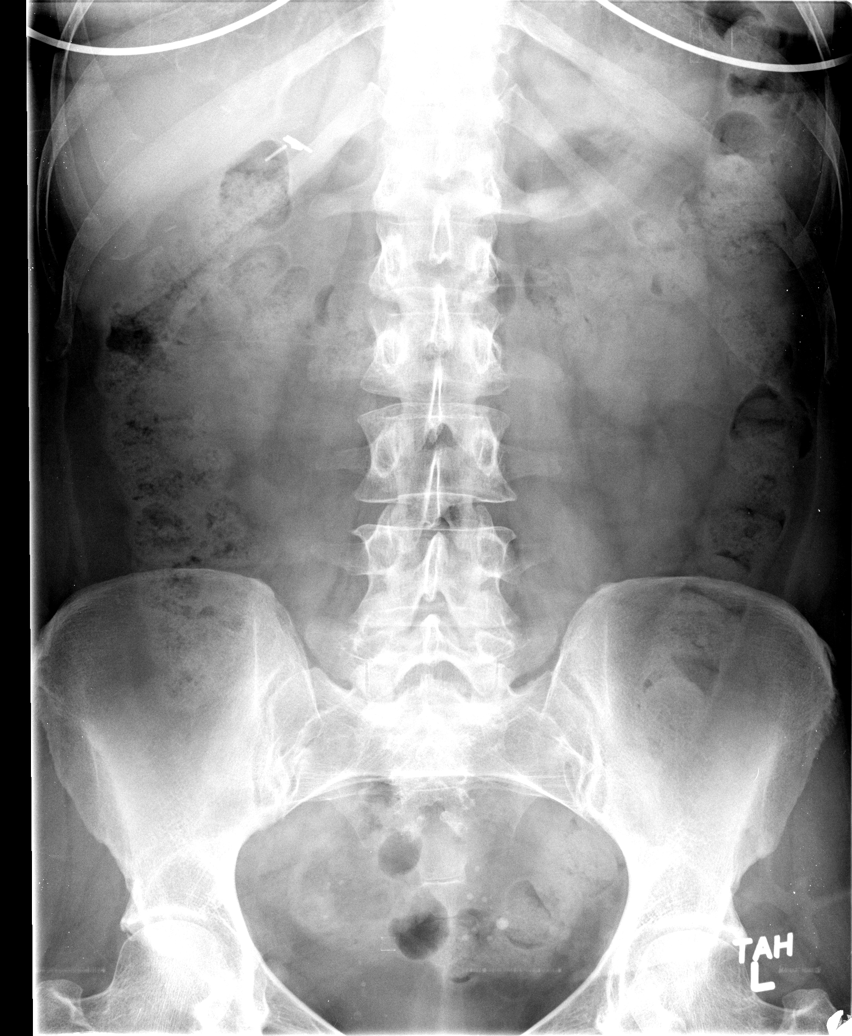

[view not recorded (2 of 5)]
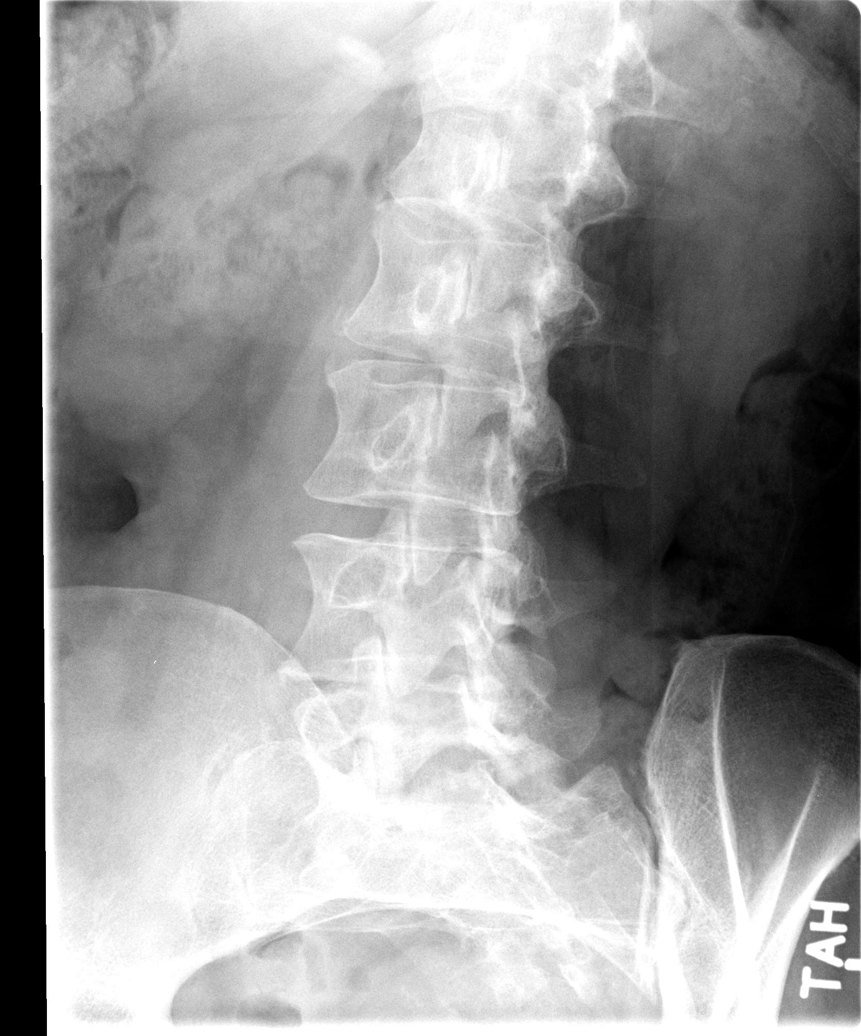

[view not recorded (3 of 5)]
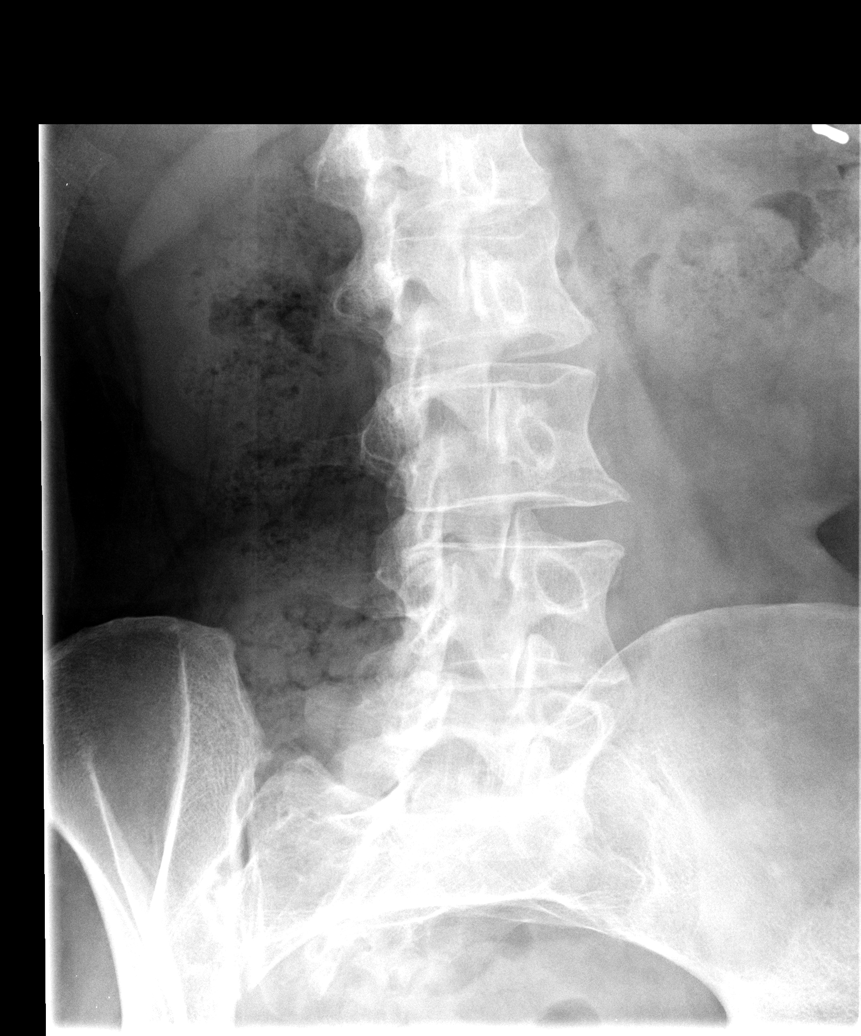

[view not recorded (4 of 5)]
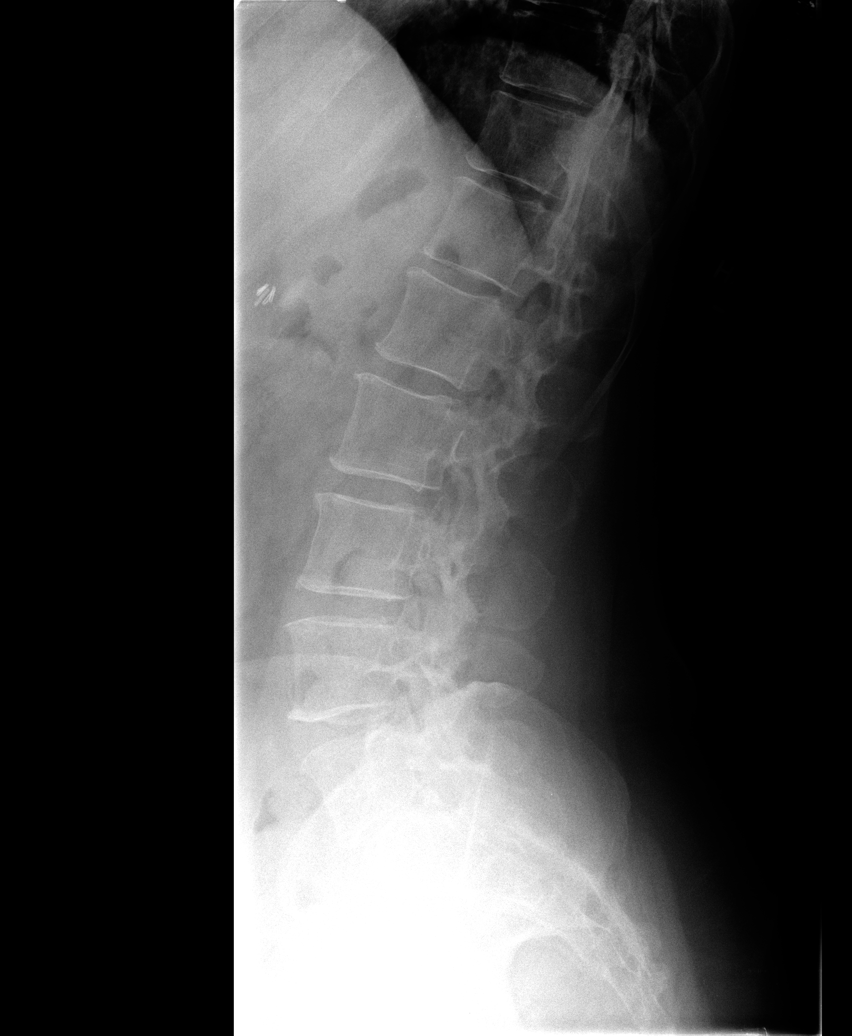

[view not recorded (5 of 5)]
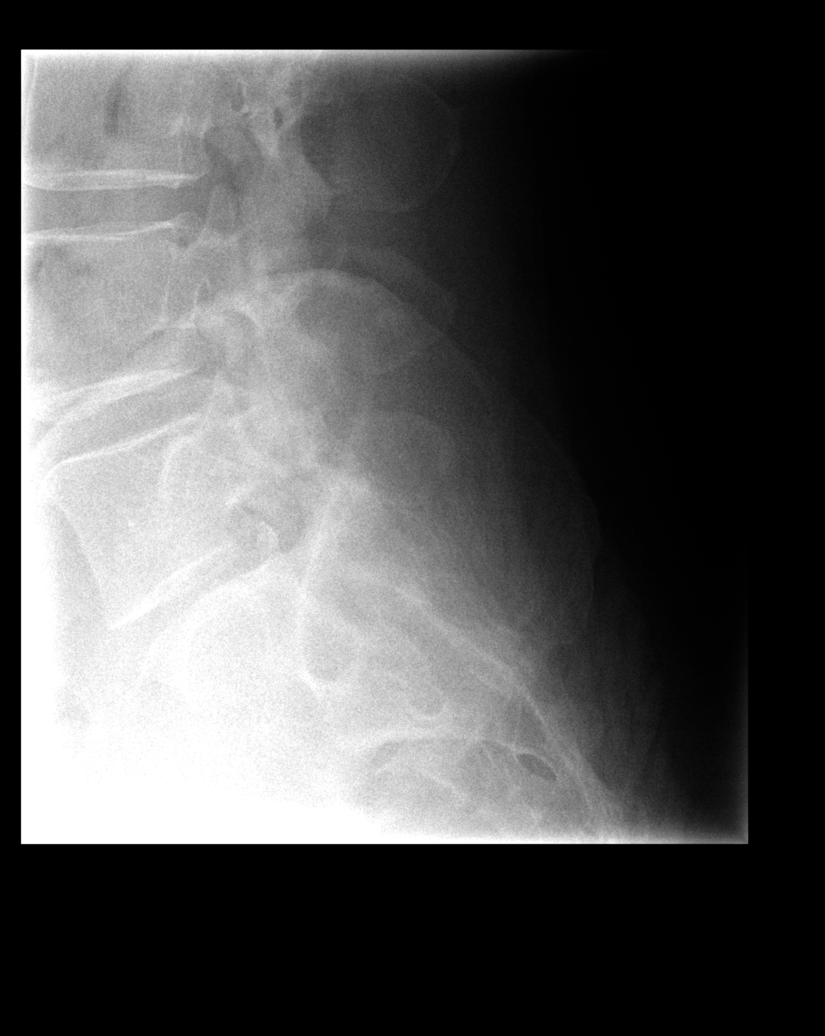

[5 of 5 positions shown; findings below may reference images not displayed]

FINDINGS: There is evidence of spondylosis primarily at the L5-S1
level with disc space narrowing and proliferative changes present.
Mild spondylosis present at L4-5.  No focal bony lesions.  No
fractures or subluxations identified.  Visualized soft tissues are
unremarkable.
IMPRESSION: Lumbar spondylosis which is most significant at L5-S1.  No focal
bony lesions identified by radiography.

## 2013-01-06 ENCOUNTER — Other Ambulatory Visit (HOSPITAL_COMMUNITY): Payer: Self-pay | Admitting: Oncology

## 2013-01-06 DIAGNOSIS — C50919 Malignant neoplasm of unspecified site of unspecified female breast: Secondary | ICD-10-CM

## 2013-01-06 MED ORDER — TAMOXIFEN CITRATE 20 MG PO TABS: 20.0000 mg | ORAL_TABLET | Freq: Every day | ORAL | Status: DC

## 2013-02-03 ENCOUNTER — Other Ambulatory Visit: Payer: Self-pay | Admitting: Family Medicine

## 2013-02-03 ENCOUNTER — Other Ambulatory Visit (HOSPITAL_COMMUNITY): Payer: Self-pay | Admitting: Oncology

## 2013-02-03 DIAGNOSIS — Z853 Personal history of malignant neoplasm of breast: Secondary | ICD-10-CM

## 2013-02-14 ENCOUNTER — Encounter (HOSPITAL_COMMUNITY): Payer: Managed Care, Other (non HMO) | Attending: Oncology | Admitting: Oncology

## 2013-02-14 ENCOUNTER — Encounter (HOSPITAL_COMMUNITY): Payer: Self-pay | Admitting: Oncology

## 2013-02-14 VITALS — BP 131/85 | HR 75 | Temp 98.3°F | Resp 18 | Wt 180.0 lb

## 2013-02-14 DIAGNOSIS — C50919 Malignant neoplasm of unspecified site of unspecified female breast: Secondary | ICD-10-CM

## 2013-02-14 DIAGNOSIS — C50911 Malignant neoplasm of unspecified site of right female breast: Secondary | ICD-10-CM

## 2013-02-14 DIAGNOSIS — Z1371 Encounter for nonprocreative screening for genetic disease carrier status: Secondary | ICD-10-CM

## 2013-02-14 HISTORY — DX: Encounter for nonprocreative screening for genetic disease carrier status: Z13.71

## 2013-02-14 NOTE — Progress Notes (Signed)
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  Will consider switching to AI when post-menopausal.  INTERVAL HISTORY: Vanessa Neal 52 y.o. 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 Repeat on the primary after definitive surgery showed ER positivity of 98%, PR 57%, HER-2/neu nonamplified, Ki-67 marker 85%. She received dose dense Adriamycin and Cytoxan, followed by weekly Taxol x12 weeks. She had tremendous improvement in the size of the tumor. Pathologically her tumor was 1.1 cm and 3 sentinel nodes were negative. No LV I was seen. Margins were clear. She had this definitive lumpectomy and sentinel node biopsies on 04/13/2010, went on to receive radiation therapy, started tamoxifen on 07/05/2010 consisting of 20 mg once a day. She'll take that for 5 years and possibly 10 years. We discussed the 10 year versus 5 year data which is positive but the absolute improvement is only a few percentage points so if she does not want to pursue 10 years versus 5 years that is fine at that time.  Vanessa Neal complains of an ache in both of her feet in the AM upon awakening.  She reports that the pain is typically in the calcaneal region of foot.  It improves as the day progresses.  I suspect this is more likely from a heel spur versus plantar fascitis versus less likely chemotherapy-induced peripheral neuropathy.  I recommended in the AM, she can use a tennis ball and roll it across the bottom of her foot with slight pressure.  This will help with blood flow and improve her symptoms if this is plantar  fascitis.  She was also encouraged to follow-up with podiatrist if it continues.   She also notes ocular sensitivity to bright lights.  Tamoxifen carries the risk of retinopathy.  As a result, I recommended she follow-up with ophthalmologist.   We spent time discussing her treatment plan moving forward.  She reports that she has not had a mentrual cycle in over 2 years.  By definition, she is post-menopausal, however, I would like to prove it with FSH, LH, estradiol.    She is due for lab work, which she wants to have done by her OB/GYN in November which will be for her annual examination.  Therefore, I wrote an order for CBC diff, CMET, FSH, LH, estradiol and request the results be faxed to [facility].   She reports that she is going to have a 3D mammogram next week.   We had a long discussion regarding Tamoxifen versus AI.  If she is post-menopausal, which I suspect she is, an AI would be superior to Tamoxifen.  Per NCCN guidelines, it is reasonable to switch a post-menopausal patient to an AI and continue for 5 years.  With this being said, Tamoxifen for 5 versus 10 years is reasonable as well, but it is considered that an AI is superior to Tamoxifen in post-menopausal setting unless contraindication exists.  I printed NCCN guidelines for the patient to have and review for her education.  At the conclusion of our visit, Vanessa Neal wishes to continue  Tamoxifen through Feb 2015 which will complete 3 years worth of therapy.  At that time, she wishes to switch to an AI and take 5-7 years worth.  I educated her that more than 5 years worth of therapy with an AI is controversial and has not been shown to be superior than 5 years worth.  We can have this discussion in the future if necessary.  Oncologically, the patient denies any complaints and ROS questioning is otherwise negative.     Past Medical History  Diagnosis Date  . Breast cancer   . Ovarian cyst   . Infiltrating ductal carcinoma  of right female breast 01/04/2011  . 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     has No Known Allergies.  Vanessa Neal does not currently have medications on file.  Past Surgical History  Procedure Laterality Date  . Cholecystectomy    . Breast lumpectomy      right  . Cesarean section      X 2  . Breast cyst excision    . Lipoma excision      from back  . Colonoscopy  03/11/2012    Procedure: COLONOSCOPY;  Surgeon: West Bali, MD;  [facility];  Service: Endoscopy;  Laterality: N/A;  9:30 AM-changed to 9:50 Doris notified  . Breast cyst aspiration  01/2012    Denies any headaches, dizziness, double vision, fevers, chills, night sweats, nausea, vomiting, diarrhea, chest pain, heart palpitations, shortness of breath, black tarry stool, urinary pain, urinary burning, urinary frequency, hematuria.   PHYSICAL EXAMINATION  ECOG PERFORMANCE STATUS: 0 - Asymptomatic  Filed Vitals:   02/14/13 1000  BP: 131/85  Pulse: 75  Temp: 98.3 F (36.8 C)  Resp: 18    GENERAL:alert, healthy, no distress, well nourished, well developed, comfortable, cooperative and smiling SKIN: skin color, texture, turgor are normal, no rashes or significant lesions HEAD: Normocephalic, No masses, lesions, tenderness or abnormalities EYES: normal, PERRLA, EOMI, Conjunctiva are pink and non-injected EARS: External ears normal OROPHARYNX:mucous membranes are moist  NECK: supple, no adenopathy, thyroid normal size, non-tender, without nodularity, no stridor, non-tender, trachea midline LYMPH:  no palpable lymphadenopathy, no hepatosplenomegaly BREAST:breasts appear normal, no suspicious masses, no skin or nipple changes or axillary nodes, lumpectomy scar well healed on right. LUNGS: clear to auscultation and percussion HEART: regular rate & rhythm, no murmurs, no gallops, S1 normal and S2 normal ABDOMEN:abdomen  soft, non-tender, normal bowel sounds, no masses or organomegaly and no hepatosplenomegaly BACK: Back symmetric, no curvature., No CVA tenderness EXTREMITIES:less then 2 second capillary refill, no joint deformities, effusion, or inflammation, no edema, no skin discoloration, no clubbing, no cyanosis  NEURO: alert & oriented x 3 with fluent speech, no focal motor/sensory deficits, gait normal    LABORATORY DATA: CBC    Component Value Date/Time   WBC 5.3 08/08/2011 1040   RBC 3.82* 08/08/2011 1040   HGB 13.4 08/08/2011 1040   HCT 38.7 08/08/2011 1040   PLT 241 08/08/2011 1040   MCV 101.3* 08/08/2011 1040   MCH 35.1* 08/08/2011 1040   MCHC 34.6 08/08/2011 1040   RDW 12.4 08/08/2011 1040   LYMPHSABS 2.2 08/08/2011 1040   MONOABS 0.4 08/08/2011 1040   EOSABS 0.1 08/08/2011 1040   BASOSABS 0.0 08/08/2011 1040      Chemistry      Component Value Date/Time   NA 138 08/08/2011 1040   K 3.9  08/08/2011 1040   CL 103 08/08/2011 1040   CO2 26 08/08/2011 1040   BUN 16 08/08/2011 1040   CREATININE 0.69 08/08/2011 1040      Component Value Date/Time   CALCIUM 10.2 08/08/2011 1040   ALKPHOS 47 08/08/2011 1040   AST 24 08/08/2011 1040   ALT 28 08/08/2011 1040   BILITOT 0.5 08/08/2011 1040        RADIOGRAPHIC STUDIES:  09/04/2012  *RADIOLOGY REPORT*  Clinical Data: Short-term follow-up after right breast nine  o'clock location cyst aspiration October 2013. History of right  lumpectomy for breast cancer previously.  RIGHT BREAST ULTRASOUND  Comparison: Prior exams  On physical exam, I palpate no abnormality in the right breast nine  o'clock location.  Findings: Ultrasound is performed, showing no cystic or solid mass  in the right breast nine o'clock area.  IMPRESSION:  No sonographic evidence for malignancy or cyst recurrent right  breast nine o'clock location.  RECOMMENDATION:  Bilateral diagnostic mammography September 2014  I have discussed the findings and recommendations with the patient.   Results were also provided in writing at the conclusion of the  visit. If applicable, a reminder letter will be sent to the  patient regarding the next appointment.  BI-RADS CATEGORY 1: Negative.  Original Report Authenticated By: Christiana Pellant, M.D.    PATHOLOGY:  04/13/2010  Accession #: [id number] Patient Name: Vanessa Neal, Vanessa Neal Visit # : [id number]  MRN: [id number] Physician: Almond Lint DOB/Age 10/28/1960 (Age: 51) Gender: F Collected Date: 04/13/2010 Received Date: 04/13/2010  FINAL DIAGNOSIS  1. Lymph node, sentinel, biopsy, right : ONE LYMPH NODE, NEGATIVE FOR METASTATIC CARCINOMA (0/1)  2. Lymph node, sentinel, biopsy, right : ONE LYMPH NODE, NEGATIVE FOR METASTATIC CARCINOMA (0/1) 3. Lymph node, sentinel, biopsy, right : ONE LYMPH NODE, NEGATIVE FOR METASTATIC CARCINOMA (0/1) 4. Breast, lumpectomy, right : RESIDUAL INVASIVE DUCTAL CARCINOMA, 1.1 CM, NCHS GRADE II. NO ANGIOLYMPHATIC INVASION. RESECTION MARGINS CLEAR. 5. Breast, excision, right, new superior margin : BENIGN BREAST TISSUE WITH FIBROTIC CHANGES, NO EVIDENCE OF MALIGNANCY.  ELECTRONIC SIGNATURE : Whitman Hero, H. Santina Evans, Sports administrator, Electronic Signature  MICROSCOPIC DESCRIPTION 4. BREAST, WITH LYMPH NODE SAMPLING Specimen, including laterality: RIGHT BREAST. Procedure: LUMPECTOMY Tumor size of largest invasive carcinoma (gross measurement or glass slide measurement): 1.1 cm Margins: Invasive component, distance to closest margin: ALL MARGINS: >1 CM In SITU component, distance to closest margin: ALL MARGINS: >1 CM Histologic type, invasive component: INVASIVE DUCTAL CARCINOMA Grade, invasive component (Nottingham combined histologic score): II Tubule formation grade: 2 Nuclear pleomorphism grade: 2 Mitotic grade: 2 Ductal carcinoma in situ: N/A Lobular neoplasia present: NO Treatment effect (if treated with neoadjuvant therapy): YES Multicentric (separate tumors in different quadrants):  NO Multifocal (separate tumors in same quadrant or biopsy): NO Macroscopic or microscopic extent of tumor: Skin: N/A Nipple: N/A Skeletal muscle: N/A Axillary lymph nodes: Number examined: 3 Number with metastasis: 0 ITC (isolated tumor cells, < 0.25mm): 0 Micrometastasis (> 0.65mm, < 2mm): 0 Metastasis > 2 mm: 0 Extracapsular extension: 0 Method of detection of metastases: H and E, cytokeratin or both: BOTH METHODS TNM Code: pT1c, pN0(sn-)(i-), Breast Prognostic Markers: Estrogen receptor: 75%, POSITIVE Progesterone receptor: 97%, POSITIVE Ki 67 (Mib-1): 85% Her 2 neu by CISH: NO AMPLIFICATION, THE RATIO OF HER-2:CEP 17 WAS 1.73. THE BREAST PROGNOSTIC PROFILE WILL BE REPEATED AND AN ADDENDUM WILL FOLLOW. Non-neoplastic breast: EXTENSIVE POSTTREATMENT EFFECT AND FIBROCYSTIC CHANGES  CASE COMMENTS STAINS USED IN DIAGNOSIS: CK AE1AE3 CK AE1AE3 CK AE1AE3 Universal Negative Control-Mouse ER-ACIS  PR-ACIS CISH CUT 1 BLANK SLIDE FOR IHC RECUT 1 SLIDE RECUT 1 SLIDE  ADDENDUM PROGNOSTIC INDICATORS - ACIS Interpretation This carcinoma is positive for estrogen receptor and positive for progesterone receptor expression.    ASSESSMENT:  1. Stage II A. infiltrating ductal carcinoma of the right breast with her tumor greater than 2.5 cm by mammography, 3 cm by MRI, ER 75% PR 97% Ki-67 marker high at 85% HER-2/neu nonamplified on her initial biopsy. Repeat on the primary after definitive surgery showed ER positivity of 98%, PR 57%, HER-2/neu nonamplified, Ki-67 marker 85%. She received dose dense Adriamycin and Cytoxan, followed by weekly Taxol x12 weeks. She had tremendous improvement in the size of the tumor. Pathologically her tumor was 1.1 cm and 3 sentinel nodes were negative. No LV I was seen. Margins were clear. She had this definitive lumpectomy and sentinel node biopsies on 04/13/2010, went on to receive radiation therapy, started tamoxifen on 07/05/2010 consisting of 20 mg once a  day. She'll take that for 5 years and possibly 10 years. We discussed the 10 year versus 5 year data which is positive but the absolute improvement is only a few percentage points so if she does not want to pursue 10 years versus 5 years that is fine at that time. 2. BRCA1 or BRCA2 negative 3. B/L AM foot pain at calcaneous, ?plantar fascitis versus heel spur? 4. Ocular sensitivity to light   Patient Active Problem List   Diagnosis Date Noted  . BRCA negative 02/14/2013  . 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 I personally reviewed and went over laboratory results with the patient. 2. I personally reviewed and went over radiographic studies with the patient. 3. Recommend rolling a tennis ball under foot in AM to help with circulation and breakdown fibrosis. 4. Recommend ice to foot as well 5. Recommended follow-up with podiatrist if symptoms remain. 6. Patient education regarding Tamoxifen-induced retinopathy 7. Recommend follow-up with ophthalmologist 8. Rx for lab orders: CBC diff, CMET, FSH, LH, Estradiol 9. Follow-up with Gyn as scheduled in 2 months.  Labs to be drawn at that appt. 10. Long discussion regarding switch to AI if proven to be post-menopausal.  NCCN guidelines printed for patient to review. 11. Return in 6 months for follow-up.   THERAPY PLAN:  While on Tamoxifen, recommend annual Gyn visit with internal examination due to increased risk of uterine malignancy while on Tamoxifen.  We had a long discussion regarding Tamoxifen versus AI in post-menopausal state.  AI is believed to be superior to Tamoxifen in the post-menopausal population without clear contraindication.  We reviewed the NCCN guidelines regarding adjuvant endocrine therapy.  Tamoxifen for 5 versus 10 years remains a recommendation, but an AI receives category 1 rating.  At conclusion of today's visit, patient wishes to continue Tamoxifen through Feb 2015 which will  complete 3 years worth of therapy and then switch to AI.  I recommend 5 years worth of an AI at time of switch, but we may consider 7 years worth although data is controversial regarding length of AI treatment.  This will need to be an ongoing discussion moving forward as more data may be reported in the upcoming years.  All questions were answered. The patient knows to call the clinic with any problems, questions or concerns. We can certainly see the patient much sooner if necessary.  Patient and plan discussed with Dr. Alla German and he is in agreement with the aforementioned.  I spent 40 minutes counseling the patient face to face. The total time spent in the appointment was 50 minutes.  KEFALAS,THOMAS

## 2013-02-14 NOTE — Patient Instructions (Addendum)
.  [facility] Discharge Instructions  RECOMMENDATIONS MADE BY THE CONSULTANT AND ANY TEST RESULTS WILL BE SENT TO YOUR REFERRING PHYSICIAN.  EXAM FINDINGS BY THE PHYSICIAN TODAY AND SIGNS OR SYMPTOMS TO REPORT TO CLINIC OR PRIMARY PHYSICIAN: Exam and findings as discussed by Jenita Seashore PA.  Discussed tamoxifen verses aromatase inhibitor INSTRUCTIONS/FOLLOW-UP: 6 months  Thank you for choosing [facility] to provide your oncology and hematology care.  To afford each patient quality time with our providers, please arrive at least 15 minutes before your scheduled appointment time.  With your help, our goal is to use those 15 minutes to complete the necessary work-up to ensure our physicians have the information they need to help with your evaluation and healthcare recommendations.    Effective January 1st, 2014, we ask that you re-schedule your appointment with our physicians should you arrive 10 or more minutes late for your appointment.  We strive to give you quality time with our providers, and arriving late affects you and other patients whose appointments are after yours.    Again, thank you for choosing [facility].  Our hope is that these requests will decrease the amount of time that you wait before being seen by our physicians.       _____________________________________________________________  Should you have questions after your visit to [facility], please contact our office at [phone] between the hours of 8:30 a.m. and 5:00 p.m.  Voicemails left after 4:30 p.m. will not be returned until the following business day.  For prescription refill requests, have your pharmacy contact our office with your prescription refill request.

## 2013-02-21 ENCOUNTER — Ambulatory Visit
Admission: RE | Admit: 2013-02-21 | Discharge: 2013-02-21 | Disposition: A | Discharge status: Home / Self Care | Payer: Managed Care, Other (non HMO) | Source: Ambulatory Visit | Attending: Family Medicine | Admitting: Family Medicine

## 2013-02-21 DIAGNOSIS — Z853 Personal history of malignant neoplasm of breast: Secondary | ICD-10-CM

## 2013-04-21 ENCOUNTER — Encounter (INDEPENDENT_AMBULATORY_CARE_PROVIDER_SITE_OTHER): Payer: Self-pay

## 2013-04-21 ENCOUNTER — Ambulatory Visit (INDEPENDENT_AMBULATORY_CARE_PROVIDER_SITE_OTHER): Payer: Managed Care, Other (non HMO) | Admitting: Adult Health

## 2013-04-21 ENCOUNTER — Encounter: Payer: Self-pay | Admitting: Adult Health

## 2013-04-21 VITALS — BP 120/80 | HR 78 | Ht 64.0 in | Wt 185.0 lb

## 2013-04-21 DIAGNOSIS — Z1212 Encounter for screening for malignant neoplasm of rectum: Secondary | ICD-10-CM

## 2013-04-21 DIAGNOSIS — E041 Nontoxic single thyroid nodule: Secondary | ICD-10-CM | POA: Insufficient documentation

## 2013-04-21 DIAGNOSIS — Z01419 Encounter for gynecological examination (general) (routine) without abnormal findings: Secondary | ICD-10-CM

## 2013-04-21 DIAGNOSIS — C50911 Malignant neoplasm of unspecified site of right female breast: Secondary | ICD-10-CM

## 2013-04-21 LAB — CBC WITH DIFFERENTIAL/PLATELET
Basophils Relative: 0 % (ref 0–1)
Eosinophils Absolute: 0.1 10*3/uL (ref 0.0–0.7)
Eosinophils Relative: 1 % (ref 0–5)
Hemoglobin: 12.9 g/dL (ref 12.0–15.0)
MCH: 34.4 pg — ABNORMAL HIGH (ref 26.0–34.0)
MCHC: 34.8 g/dL (ref 30.0–36.0)
MCV: 98.9 fL (ref 78.0–100.0)
Monocytes Relative: 7 % (ref 3–12)
Neutrophils Relative %: 44 % (ref 43–77)

## 2013-04-21 LAB — HEMOCCULT GUIAC POC 1CARD (OFFICE)

## 2013-04-21 LAB — COMPREHENSIVE METABOLIC PANEL
BUN: 14 mg/dL (ref 6–23)
CO2: 25 mEq/L (ref 19–32)
Calcium: 9.8 mg/dL (ref 8.4–10.5)
Chloride: 101 mEq/L (ref 96–112)
Creat: 0.79 mg/dL (ref 0.50–1.10)

## 2013-04-21 LAB — TSH: TSH: 3.957 u[IU]/mL (ref 0.350–4.500)

## 2013-04-21 LAB — LIPID PANEL
HDL: 57 mg/dL (ref >39)
Triglycerides: 155 mg/dL — ABNORMAL HIGH (ref <150)

## 2013-04-21 NOTE — Patient Instructions (Signed)
Physical in 1 year Mammogram yearly Colonoscopy 2023

## 2013-04-21 NOTE — Progress Notes (Signed)
Patient ID: Vanessa Neal, female   DOB: 11/28/1960, 52 y.o.   MRN: [id number] History of Present Illness: Vanessa Neal is a 52 year old white female, married in for physical.Had normal pap with negative HPV 02/15/12. She had right breast cancer and is on tamoxifen and is having hot flashes and takes Effexor.She has a stable right thyroid nodule.   Current Medications, Allergies, Past Medical History, Past Surgical History, Family History and Social History were reviewed in [employer] record.   Past Medical History  Diagnosis Date  . Breast cancer   . Ovarian cyst   . 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 BRCA negative 02/14/2013  . Right thyroid nodule   Current outpatient prescriptions:calcium-vitamin D (OSCAL WITH D) 500-200 MG-UNIT per tablet, Take 1 tablet by mouth 2 (two) times daily. , Disp: , Rfl: ;  Multiple Vitamins-Calcium (ONE-A-DAY WOMENS PO), Take by mouth daily., Disp: , Rfl: ;  tamoxifen (NOLVADEX) 20 MG tablet, Take 1 tablet (20 mg total) by mouth daily., Disp: 90 tablet, Rfl: 3 venlafaxine (EFFEXOR) 37.5 MG tablet, Take 1 tablet (37.5 mg total) by mouth 2 (two) times daily., Disp: 60 tablet, Rfl: 2  Past Surgical History  Procedure Laterality Date  . Cholecystectomy    . Breast lumpectomy      right  . Cesarean section      X 2  . Breast cyst excision    . Lipoma excision      from back  . Colonoscopy  03/11/2012    Procedure: COLONOSCOPY;  Surgeon: West Bali, MD;  Location: [facility];  Service: Endoscopy;  Laterality: N/A;  9:30 AM-changed to 9:50 Doris notified  . Breast cyst aspiration  01/2012    Review of Systems: Patient denies any headaches, blurred vision, shortness of breath, chest pain, abdominal pain, problems with bowel movements, urination, or intercourse. No joint pain or mood changes but has hot flashes and Hilary Hertz PA wants FSH/LH/Estradiol level checked so he can change meds if post  menopausal.    Physical Exam:BP 120/80  Pulse 78  Ht 5\' 4"  (1.626 m)  Wt 185 lb (83.915 kg)  BMI 31.74 kg/m2 General:  Well developed, well nourished, no acute distress Skin:  Warm and dry Neck:  Midline trachea,  Thyroid enlarged, right >left, Had [place] 2013 and nodule on right is stable Lungs; Clear to auscultation bilaterally Breast:  No dominant palpable mass, retraction, or nipple discharge, has scar right breast Cardiovascular: Regular rate and rhythm Abdomen:  Soft, non tender, no hepatosplenomegaly Pelvic:  External genitalia is normal in appearance.  The vagina is normal in appearance.The cervix is bulbous.  Uterus is felt to be normal size, shape, and contour.  No adnexal masses or tenderness noted. Rectal: Good sphincter tone, no polyps, or hemorrhoids felt.  Hemoccult negative.Had colonoscopy 2013 and good for 10 years Extremities:  No swelling or varicosities noted Psych:  No mood changes, alert and cooperative   Impression: Yearly gyn exam no pap History of right breast cancer Hot flashes on tamoxifen Right thyroid nodule    Plan: Check CBC with diff,CMP, TSh and lipids and FSH,LH, estradiol levels, copy to Hilary Hertz PA Physical in 1 year Mammogram yearly Colonoscopy 2023 Will talk in 24-48 hours about labs

## 2013-04-22 LAB — FOLLICLE STIMULATING HORMONE: FSH: 21.5 m[IU]/mL

## 2013-04-22 LAB — LUTEINIZING HORMONE: LH: 10.4 m[IU]/mL

## 2013-05-09 ENCOUNTER — Other Ambulatory Visit: Payer: Self-pay | Admitting: Adult Health

## 2013-05-30 ENCOUNTER — Other Ambulatory Visit (HOSPITAL_COMMUNITY): Payer: Self-pay | Admitting: Oncology

## 2013-05-30 DIAGNOSIS — C50911 Malignant neoplasm of unspecified site of right female breast: Secondary | ICD-10-CM

## 2013-05-30 MED ORDER — VENLAFAXINE HCL 37.5 MG PO TABS: 37.5000 mg | ORAL_TABLET | Freq: Two times a day (BID) | ORAL | Status: DC

## 2013-08-15 ENCOUNTER — Encounter (HOSPITAL_COMMUNITY): Payer: Self-pay

## 2013-08-15 ENCOUNTER — Encounter (HOSPITAL_COMMUNITY): Payer: BC Managed Care – PPO | Attending: Hematology and Oncology

## 2013-08-15 ENCOUNTER — Encounter (HOSPITAL_BASED_OUTPATIENT_CLINIC_OR_DEPARTMENT_OTHER): Payer: BC Managed Care – PPO

## 2013-08-15 VITALS — BP 140/96 | HR 78 | Temp 97.9°F | Resp 18 | Wt 186.5 lb

## 2013-08-15 DIAGNOSIS — Z171 Estrogen receptor negative status [ER-]: Secondary | ICD-10-CM

## 2013-08-15 DIAGNOSIS — C50919 Malignant neoplasm of unspecified site of unspecified female breast: Secondary | ICD-10-CM

## 2013-08-15 DIAGNOSIS — Z9221 Personal history of antineoplastic chemotherapy: Secondary | ICD-10-CM | POA: Insufficient documentation

## 2013-08-15 DIAGNOSIS — C50519 Malignant neoplasm of lower-outer quadrant of unspecified female breast: Secondary | ICD-10-CM

## 2013-08-15 DIAGNOSIS — E041 Nontoxic single thyroid nodule: Secondary | ICD-10-CM

## 2013-08-15 DIAGNOSIS — Z9010 Acquired absence of unspecified breast and nipple: Secondary | ICD-10-CM | POA: Insufficient documentation

## 2013-08-15 DIAGNOSIS — Z853 Personal history of malignant neoplasm of breast: Secondary | ICD-10-CM | POA: Insufficient documentation

## 2013-08-15 DIAGNOSIS — Z09 Encounter for follow-up examination after completed treatment for conditions other than malignant neoplasm: Secondary | ICD-10-CM | POA: Insufficient documentation

## 2013-08-15 LAB — COMPREHENSIVE METABOLIC PANEL
ALT: 28 U/L (ref 0–35)
AST: 31 U/L (ref 0–37)
Albumin: 3.9 g/dL (ref 3.5–5.2)
Alkaline Phosphatase: 42 U/L (ref 39–117)
BUN: 16 mg/dL (ref 6–23)
CALCIUM: 9.9 mg/dL (ref 8.4–10.5)
CO2: 27 mEq/L (ref 19–32)
CREATININE: 0.73 mg/dL (ref 0.50–1.10)
Chloride: 102 mEq/L (ref 96–112)
GFR calc Af Amer: >90 mL/min (ref >90)
Glucose, Bld: 97 mg/dL (ref 70–99)
Potassium: 4 mEq/L (ref 3.7–5.3)
Sodium: 140 mEq/L (ref 137–147)
Total Bilirubin: 0.5 mg/dL (ref 0.3–1.2)
Total Protein: 8.2 g/dL (ref 6.0–8.3)

## 2013-08-15 LAB — CBC WITH DIFFERENTIAL/PLATELET
BASOS ABS: 0 10*3/uL (ref 0.0–0.1)
BASOS PCT: 0 % (ref 0–1)
Eosinophils Absolute: 0.1 10*3/uL (ref 0.0–0.7)
Eosinophils Relative: 2 % (ref 0–5)
HEMATOCRIT: 38.3 % (ref 36.0–46.0)
Hemoglobin: 13.2 g/dL (ref 12.0–15.0)
Lymphocytes Relative: 45 % (ref 12–46)
Lymphs Abs: 2.8 10*3/uL (ref 0.7–4.0)
MCH: 34.4 pg — ABNORMAL HIGH (ref 26.0–34.0)
MCHC: 34.5 g/dL (ref 30.0–36.0)
MCV: 99.7 fL (ref 78.0–100.0)
Monocytes Absolute: 0.4 10*3/uL (ref 0.1–1.0)
Monocytes Relative: 6 % (ref 3–12)
NEUTROS ABS: 2.9 10*3/uL (ref 1.7–7.7)
NEUTROS PCT: 48 % (ref 43–77)
Platelets: 233 10*3/uL (ref 150–400)
RBC: 3.84 MIL/uL — ABNORMAL LOW (ref 3.87–5.11)
RDW: 12.5 % (ref 11.5–15.5)
WBC: 6.2 10*3/uL (ref 4.0–10.5)

## 2013-08-15 LAB — CANCER ANTIGEN 27.29: CA 27.29: 17 U/mL (ref 0–39)

## 2013-08-15 LAB — CEA: CEA: 2.6 ng/mL (ref 0.0–5.0)

## 2013-08-15 NOTE — Progress Notes (Signed)
[facility]  OFFICE PROGRESS NOTE  [name], MD [address] [place] [address]  DIAGNOSIS: Breast cancer - Plan: CBC with Differential, CEA, Cancer antigen 27.29, Comprehensive metabolic panel  Right thyroid nodule  Chief Complaint  Patient presents with  . Breast Cancer    Tamoxifen therapy    CURRENT THERAPY: Tamoxifen 20 mg daily  INTERVAL HISTORY: Vanessa Neal 53 y.o. female returns for followup and continuation of endocrine therapy for stage II right breast cancer treated with neoadjuvant chemotherapy followed by lumpectomy, sentinel or biopsy, radiotherapy, and tamoxifen which was started on 07/05/2010 with definitive surgery on 04/13/2010.  She was also found to have a hot thyroid nodule in the past was treated with radioactive iodine on 2 occasions was near normalization of T3 and T4. She gets hot flashes from tamoxifen but they are less in intensity and in number using Effexor. She denies any vaginal bleeding or discharge, abdominal pain, nausea, vomiting, diarrhea, constipation, dysuria, hematuria, but does have occasional lower extremity swelling but without redness. She denies any chest pain, PND, orthopnea, palpitations, or abnormalities on self breast examination. She also denies any lymphedema, skin rash, headache, or seizures but does have occasional joint aches particularly at the end of the workday. 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  MEDICAL HISTORY: Past Medical History  Diagnosis Date  . Breast cancer   . Ovarian cyst   . 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 BRCA negative 02/14/2013  . 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   Stage II A. infiltrating ductal carcinoma of the right breast with her tumor greater  than 2.5 cm by mammography, 3 cm by MRI, ER 75% PR 97% Ki-67 marker high at 85% HER-2/neu nonamplified on her initial biopsy. Repeat on the primary after definitive surgery showed ER positivity of 98%, PR 57%, HER-2/neu nonamplified, Ki-67 marker 85%. She received dose dense Adriamycin and Cytoxan, followed by weekly Taxol x12 weeks. She had tremendous improvement in the size of the tumor. Pathologically her tumor was 1.1 cm and 3 sentinel nodes were negative. No LV I was seen. Margins were clear. She had this definitive lumpectomy and sentinel node biopsies on 04/13/2010, went on to receive radiation therapy, started tamoxifen on 07/05/2010 consisting of 20 mg once a day. She'll take that for 5 years and possibly 10 years. We discussed the 10 year versus 5 year data which is positive but the absolute improvement is only a few percentage points so if she does not want to pursue 10 years versus 5 years that is fine at that time.  ALLERGIES:  has No Known Allergies.  MEDICATIONS: has a current medication list which includes the following prescription(s): calcium-vitamin d, docusate sodium, multiple vitamins-calcium, tamoxifen, and venlafaxine.  SURGICAL HISTORY:  Past Surgical History  Procedure Laterality Date  . Cholecystectomy    . Breast lumpectomy      right  . Cesarean section      X 2  . Breast cyst excision    . Lipoma excision      from back  . Colonoscopy  03/11/2012    Procedure: COLONOSCOPY;  Surgeon: Danie Binder, MD;  [facility];  Service: Endoscopy;  Laterality: N/A;  9:30 AM-changed to 9:50  Doris notified  . Breast cyst aspiration  01/2012    FAMILY HISTORY: family history includes Cancer in her maternal grandmother, mother, paternal grandmother, and sister; Colon cancer in her maternal grandmother; Kidney failure in her father.  SOCIAL HISTORY:  reports that she has never smoked. She has never used smokeless tobacco. She reports that she does not drink alcohol or  use illicit drugs.  REVIEW OF SYSTEMS:  Other than that discussed above is noncontributory.  PHYSICAL EXAMINATION: ECOG PERFORMANCE STATUS: 1 - Symptomatic but completely ambulatory  Blood pressure 140/96, pulse 78, temperature 97.9 F (36.6 C), temperature source Oral, resp. rate 18, weight 186 lb 8 oz (84.596 kg), SpO2 100.00%.  GENERAL:alert, no distress and comfortable SKIN: skin color, texture, turgor are normal, no rashes or significant lesions EYES: PERLA; Conjunctiva are pink and non-injected, sclera clear SINUSES: No redness or tenderness over maxillary or ethmoid sinuses OROPHARYNX:no exudate, no erythema on lips, buccal mucosa, or tongue. NECK: thyroid nontender  without nodularity. No masses CHEST:  status post right breast lumpectomy with no masses in either breast. LYMPH:  no palpable lymphadenopathy in the cervical, axillary or inguinal LUNGS: clear to auscultation and percussion with normal breathing effort HEART: regular rate & rhythm and no murmurs. ABDOMEN:abdomen soft, non-tender and normal bowel sounds MUSCULOSKELETAL:no cyanosis of digits and no clubbing. Range of motion normal.  NEURO: alert & oriented x 3 with fluent speech, no focal motor/sensory deficits   LABORATORY DATA: Infusion on 08/15/2013  Component Date Value Ref Range Status  . WBC 08/15/2013 6.2  4.0 - 10.5 K/uL Final  . RBC 08/15/2013 3.84* 3.87 - 5.11 MIL/uL Final  . Hemoglobin 08/15/2013 13.2  12.0 - 15.0 g/dL Final  . HCT 08/15/2013 38.3  36.0 - 46.0 % Final  . MCV 08/15/2013 99.7  78.0 - 100.0 fL Final  . MCH 08/15/2013 34.4* 26.0 - 34.0 pg Final  . MCHC 08/15/2013 34.5  30.0 - 36.0 g/dL Final  . RDW 08/15/2013 12.5  11.5 - 15.5 % Final  . Platelets 08/15/2013 233  150 - 400 K/uL Final  . Neutrophils Relative % 08/15/2013 48  43 - 77 % Final  . Neutro Abs 08/15/2013 2.9  1.7 - 7.7 K/uL Final  . Lymphocytes Relative 08/15/2013 45  12 - 46 % Final  . Lymphs Abs 08/15/2013 2.8  0.7 - 4.0  K/uL Final  . Monocytes Relative 08/15/2013 6  3 - 12 % Final  . Monocytes Absolute 08/15/2013 0.4  0.1 - 1.0 K/uL Final  . Eosinophils Relative 08/15/2013 2  0 - 5 % Final  . Eosinophils Absolute 08/15/2013 0.1  0.0 - 0.7 K/uL Final  . Basophils Relative 08/15/2013 0  0 - 1 % Final  . Basophils Absolute 08/15/2013 0.0  0.0 - 0.1 K/uL Final    PATHOLOGY: no new pathology.  Urinalysis    Component Value Date/Time   COLORURINE YELLOW 10/12/2010 0950   APPEARANCEUR CLEAR 10/12/2010 0950   LABSPEC 1.020 10/12/2010 0950   PHURINE 6.5 10/12/2010 0950   GLUCOSEU NEGATIVE 10/12/2010 0950   HGBUR NEGATIVE 10/12/2010 0950   BILIRUBINUR NEGATIVE 10/12/2010 0950   KETONESUR NEGATIVE 10/12/2010 0950   PROTEINUR NEGATIVE 10/12/2010 0950   UROBILINOGEN 0.2 10/12/2010 0950   NITRITE NEGATIVE 10/12/2010 0950   LEUKOCYTESUR NEGATIVE 10/12/2010 0950    RADIOGRAPHIC STUDIES: MM Digital Diagnostic Bilat Status: Final result            Study Result    CLINICAL DATA: Right lumpectomy in 2011.  EXAM:  DIGITAL DIAGNOSTIC BILATERAL MAMMOGRAM with CAD  DIGITAL BREAST TOMOSYNTHESIS  Digital breast tomosynthesis images are acquired in two projections.  These images are reviewed in combination with the digital mammogram,  confirming the findings below.  COMPARISON: Multiple priors  ACR Breast Density Category c: The breast tissue is heterogeneously  dense, which may obscure small masses.  FINDINGS:  Standard CC and MLO views of both breasts were obtained in addition  to a magnification view on the right. Stable post lumpectomy changes  on the right. No suspicious microcalcifications, masses, or new  architectural distortion.  Mammographic images were processed with CAD.  IMPRESSION:  Benign findings  RECOMMENDATION:  Bilateral diagnostic mammogram in 1 year  I have discussed the findings and recommendations with the patient.  Results were also provided in writing at the conclusion of the  visit.  If applicable, a reminder letter will be sent to the patient  regarding the next appointment.  BI-RADS CATEGORY 2: Benign Finding(s)  Electronically Signed  By: Donavan Burnet M.D.  On: 02/21/2013      ASSESSMENT:  #1.Stage II A. infiltrating ductal carcinoma of the right breast with her tumor greater than 2.5 cm by mammography, 3 cm by MRI, ER 75% PR 97% Ki-67 marker high at 85% HER-2/neu nonamplified on her initial biopsy. Repeat on the primary after definitive surgery showed ER positivity of 98%, PR 57%, HER-2/neu nonamplified, Ki-67 marker 85%. She received dose dense Adriamycin and Cytoxan, followed by weekly Taxol x12 weeks. She had tremendous improvement in the size of the tumor. Pathologically her tumor was 1.1 cm and 3 sentinel nodes were negative. No LV I was seen. Margins were clear. She had this definitive lumpectomy and sentinel node biopsies on 04/13/2010, went on to receive radiation therapy, started tamoxifen on 07/05/2010 consisting of 20 mg once a day. #2. Hot thyroid nodule, status post radioactive iodine treatment x2, normal TSH. #3. Remains premenopausal by biochemical analysis. #4. 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   PLAN:  #1. Results of the SOFT trial as published in the [place] of Medicine 372: [phone] (2015 )indicate that individuals with high-risk ER positive breast cancer who received chemotherapy experienced superior progression free survival utilizing ovarian ablation in addition to exemestane versus tamoxifen alone or tamoxifen plus ovarian ablation. A discussion was therefore instigated about the possibility of utilizing [facility] agonists to render the patient postmenopausal and add exemestane to her treatment versus undergoing bilateral oophorectomy and then instituting exemestane therapy. This was particularly important in regard to the fact that her grandmother had ovarian cancer even though the patient herself is BRCA negative. #2. Continue  tamoxifen 20 mg daily in the meantime. #3. Followup in 6 months with CBC, chem profile, [place] 27[address], and FSH, LH, and estradiol level if oophorectomy has not been performed. Patient was offered the alternative of beginning an [facility] agonist now and switching to exemestane but she would prefer to undergo the oophorectomy in view of the fact that there was a family history of ovarian cancer and once the organs are removed, the likelihood of developing cancer is markedly diminished.   All questions were answered. The patient knows to call the clinic with any problems, questions or concerns. We can certainly see the patient much sooner if necessary.   I spent 40 minutes counseling the patient face to face. The total time spent in the appointment was 45 minutes.    Doroteo Bradford, MD 08/15/2013 10:13 AM

## 2013-08-15 NOTE — Progress Notes (Signed)
Labs drawn today for cbc/diff,cmp,cea,ca2729 

## 2013-08-15 NOTE — Patient Instructions (Signed)
[facility] Discharge Instructions  RECOMMENDATIONS MADE BY THE CONSULTANT AND ANY TEST RESULTS WILL BE SENT TO YOUR REFERRING PHYSICIAN. We will see you in 6 months and do labs at that time. Please follow up with your ob-gyn for removal of your ovaries.  Please call us if you do have the surgery and we will see you before the surgery.  Thank you for choosing [facility] to provide your oncology and hematology care.  To afford each patient quality time with our providers, please arrive at least 15 minutes before your scheduled appointment time.  With your help, our goal is to use those 15 minutes to complete the necessary work-up to ensure our physicians have the information they need to help with your evaluation and healthcare recommendations.    Effective January 1st, 2014, we ask that you re-schedule your appointment with our physicians should you arrive 10 or more minutes late for your appointment.  We strive to give you quality time with our providers, and arriving late affects you and other patients whose appointments are after yours.    Again, thank you for choosing [facility].  Our hope is that these requests will decrease the amount of time that you wait before being seen by our physicians.       _____________________________________________________________  Should you have questions after your visit to [facility], please contact our office at [phone] between the hours of 8:30 a.m. and 5:00 p.m.  Voicemails left after 4:30 p.m. will not be returned until the following business day.  For prescription refill requests, have your pharmacy contact our office with your prescription refill request.

## 2013-12-12 ENCOUNTER — Other Ambulatory Visit (HOSPITAL_COMMUNITY): Payer: Self-pay | Admitting: Oncology

## 2013-12-12 DIAGNOSIS — C50911 Malignant neoplasm of unspecified site of right female breast: Secondary | ICD-10-CM

## 2013-12-12 MED ORDER — VENLAFAXINE HCL 37.5 MG PO TABS: 37.5000 mg | ORAL_TABLET | Freq: Two times a day (BID) | ORAL | Status: DC

## 2014-01-12 DIAGNOSIS — Z029 Encounter for administrative examinations, unspecified: Secondary | ICD-10-CM

## 2014-02-09 ENCOUNTER — Other Ambulatory Visit: Payer: Self-pay | Admitting: Oncology

## 2014-02-09 DIAGNOSIS — Z853 Personal history of malignant neoplasm of breast: Secondary | ICD-10-CM

## 2014-02-09 DIAGNOSIS — Z9889 Other specified postprocedural states: Secondary | ICD-10-CM

## 2014-02-16 ENCOUNTER — Encounter (HOSPITAL_COMMUNITY): Payer: BC Managed Care – PPO | Attending: Hematology and Oncology

## 2014-02-16 ENCOUNTER — Encounter (HOSPITAL_COMMUNITY): Payer: BC Managed Care – PPO

## 2014-02-16 ENCOUNTER — Encounter (HOSPITAL_COMMUNITY): Payer: Self-pay

## 2014-02-16 VITALS — BP 141/96 | HR 83 | Temp 98.2°F | Resp 18 | Wt 192.0 lb

## 2014-02-16 DIAGNOSIS — C50911 Malignant neoplasm of unspecified site of right female breast: Secondary | ICD-10-CM

## 2014-02-16 DIAGNOSIS — Z170 Estrogen receptor positive status [ER+]: Secondary | ICD-10-CM

## 2014-02-16 DIAGNOSIS — C50519 Malignant neoplasm of lower-outer quadrant of unspecified female breast: Secondary | ICD-10-CM

## 2014-02-16 NOTE — Progress Notes (Signed)
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 Stage II breast cancer    CURRENT THERAPY: Tamoxifen 20 mg daily  INTERVAL HISTORY: Vanessa Neal 53 y.o. female returns for followup and continuation of endocrine therapy for stage II right breast cancer treated with neoadjuvant chemotherapy followed by lumpectomy, sentinel or biopsy, radiotherapy, and tamoxifen which was started on 07/05/2010 with definitive surgery on 04/13/2010.  She was also found to have a hot thyroid nodule in the past was treated with radioactive iodine on 2 occasions was near normalization of T3 and T4. 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 She denies any lymphedema, hot flashes, vaginal discharge or itching, dysuria, hematuria, incontinence, abnormalities on self breast examination, sore throat, cough, wheezing, bone pain, skin rash, headache, or seizures.     MEDICAL HISTORY: Past Medical History  Diagnosis Date  . Breast cancer   . Ovarian cyst   . 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 BRCA negative 02/14/2013  . 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    ALLERGIES:  has No Known Allergies.  MEDICATIONS: has a current medication list which includes the following prescription(s): calcium-vitamin d, docusate sodium, multiple vitamins-calcium, tamoxifen, and venlafaxine.  SURGICAL HISTORY:  Past Surgical History  Procedure Laterality Date  . Cholecystectomy    . Breast lumpectomy      right  . Cesarean section      X 2  . Breast cyst excision    .  Lipoma excision      from back  . Colonoscopy  03/11/2012    Procedure: COLONOSCOPY;  Surgeon: Danie Binder, MD;  Location[facility];  Service: Endoscopy;  Laterality: N/A;  9:30 AM-changed to 9:50 Doris notified  . Breast cyst aspiration  01/2012    FAMILY HISTORY: family history includes Cancer in her maternal grandmother, mother, paternal grandmother, and sister; Colon cancer in her maternal grandmother; Kidney failure in her father.  SOCIAL HISTORY:  reports that she has never smoked. She has never used smokeless tobacco. She reports that she does not drink alcohol or use illicit drugs.  REVIEW OF SYSTEMS:  Other than that discussed above is noncontributory.  PHYSICAL EXAMINATION: ECOG PERFORMANCE STATUS: 0 - Asymptomatic  Blood pressure 141/96, pulse 83, temperature 98.2 F (36.8 C), temperature source Oral, resp. rate 18, weight 192 lb (87.091 kg).  GENERAL:alert, no distress and comfortable SKIN: skin color, texture, turgor are normal, no rashes or significant lesions EYES: PERLA; Conjunctiva are pink and non-injected, sclera clear SINUSES: No redness or tenderness over maxillary or ethmoid sinuses OROPHARYNX:no exudate, no erythema on lips, buccal mucosa, or tongue. NECK: supple, thyroid normal size, non-tender, without nodularity. No masses CHEST: Status post right lumpectomy with no masses in either breast.  LYMPH:  no palpable lymphadenopathy in the cervical, axillary or inguinal LUNGS: clear to auscultation and percussion with normal breathing effort HEART: regular rate & rhythm and no murmurs. ABDOMEN:abdomen soft, non-tender and normal bowel sounds MUSCULOSKELETAL:no cyanosis of digits and no clubbing. Range of motion normal.  NEURO: alert & oriented x 3 with fluent speech, no  focal motor/sensory deficits   LABORATORY DATA: No visits with results within 30 Day(s) from this visit. Latest known visit with results is:  Infusion on 08/15/2013  Component Date  Value Ref Range Status  . CA 27.29 08/15/2013 17  0 - 39 U/mL Final   Performed at [facility]  . WBC 08/15/2013 6.2  4.0 - 10.5 K/uL Final  . RBC 08/15/2013 3.84* 3.87 - 5.11 MIL/uL Final  . Hemoglobin 08/15/2013 13.2  12.0 - 15.0 g/dL Final  . HCT 08/15/2013 38.3  36.0 - 46.0 % Final  . MCV 08/15/2013 99.7  78.0 - 100.0 fL Final  . MCH 08/15/2013 34.4* 26.0 - 34.0 pg Final  . MCHC 08/15/2013 34.5  30.0 - 36.0 g/dL Final  . RDW 08/15/2013 12.5  11.5 - 15.5 % Final  . Platelets 08/15/2013 233  150 - 400 K/uL Final  . Neutrophils Relative % 08/15/2013 48  43 - 77 % Final  . Neutro Abs 08/15/2013 2.9  1.7 - 7.7 K/uL Final  . Lymphocytes Relative 08/15/2013 45  12 - 46 % Final  . Lymphs Abs 08/15/2013 2.8  0.7 - 4.0 K/uL Final  . Monocytes Relative 08/15/2013 6  3 - 12 % Final  . Monocytes Absolute 08/15/2013 0.4  0.1 - 1.0 K/uL Final  . Eosinophils Relative 08/15/2013 2  0 - 5 % Final  . Eosinophils Absolute 08/15/2013 0.1  0.0 - 0.7 K/uL Final  . Basophils Relative 08/15/2013 0  0 - 1 % Final  . Basophils Absolute 08/15/2013 0.0  0.0 - 0.1 K/uL Final  . CEA 08/15/2013 2.6  0.0 - 5.0 ng/mL Final   Performed at [facility]  . Sodium 08/15/2013 140  137 - 147 mEq/L Final  . Potassium 08/15/2013 4.0  3.7 - 5.3 mEq/L Final  . Chloride 08/15/2013 102  96 - 112 mEq/L Final  . CO2 08/15/2013 27  19 - 32 mEq/L Final  . Glucose, Bld 08/15/2013 97  70 - 99 mg/dL Final  . BUN 08/15/2013 16  6 - 23 mg/dL Final  . Creatinine, Ser 08/15/2013 0.73  0.50 - 1.10 mg/dL Final  . Calcium 08/15/2013 9.9  8.4 - 10.5 mg/dL Final  . Total Protein 08/15/2013 8.2  6.0 - 8.3 g/dL Final  . Albumin 08/15/2013 3.9  3.5 - 5.2 g/dL Final  . AST 08/15/2013 31  0 - 37 U/L Final  . ALT 08/15/2013 28  0 - 35 U/L Final  . Alkaline Phosphatase 08/15/2013 42  39 - 117 U/L Final  . Total Bilirubin 08/15/2013 0.5  0.3 - 1.2 mg/dL Final  . GFR calc non Af Amer 08/15/2013 >90  >90 mL/min Final  . GFR  calc Af Amer 08/15/2013 >90  >90 mL/min Final   Comment: (NOTE)                          The eGFR has been calculated using the CKD EPI equation.                          This calculation has not been validated in all clinical situations.                          eGFR's persistently <90 mL/min signify possible Chronic Kidney  Disease.    PATHOLOGY:No new pathology.  Urinalysis    Component Value Date/Time   COLORURINE YELLOW 10/12/2010 0950   APPEARANCEUR CLEAR 10/12/2010 0950   LABSPEC 1.020 10/12/2010 0950   PHURINE 6.5 10/12/2010 0950   GLUCOSEU NEGATIVE 10/12/2010 0950   HGBUR NEGATIVE 10/12/2010 0950   BILIRUBINUR NEGATIVE 10/12/2010 0950   KETONESUR NEGATIVE 10/12/2010 0950   PROTEINUR NEGATIVE 10/12/2010 0950   UROBILINOGEN 0.2 10/12/2010 0950   NITRITE NEGATIVE 10/12/2010 0950   LEUKOCYTESUR NEGATIVE 10/12/2010 0950    RADIOGRAPHIC STUDIES: No results found.  ASSESSMENT:  #1.Stage II A. infiltrating ductal carcinoma of the right breast with her tumor greater than 2.5 cm by mammography, 3 cm by MRI, ER 75% PR 97% Ki-67 marker high at 85% HER-2/neu nonamplified on her initial biopsy. Repeat on the primary after definitive surgery showed ER positivity of 98%, PR 57%, HER-2/neu nonamplified, Ki-67 marker 85%. She received dose dense Adriamycin and Cytoxan, followed by weekly Taxol x12 weeks. She had tremendous improvement in the size of the tumor. Pathologically her tumor was 1.1 cm and 3 sentinel nodes were negative. No LV I was seen. Margins were clear. She had this definitive lumpectomy and sentinel node biopsies on 04/13/2010, went on to receive radiation therapy, started tamoxifen on 07/05/2010 consisting of 20 mg once a day, tolerating well. #2. Hot thyroid nodule, status post radioactive iodine treatment x2, normal TSH.  #3. Remains premenopausal by biochemical analysis.  #4. 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 Results of the SOFT  trial as published in the [place] of Medicine 372: [phone] ([phone] )indicate that individuals with high-risk ER positive breast cancer who received chemotherapy experienced superior progression free survival utilizing ovarian ablation in addition to exemestane versus tamoxifen alone or tamoxifen plus ovarian ablation. A discussion was therefore instigated about the possibility of utilizing [facility] agonists to render the patient postmenopausal and add exemestane to her treatment versus undergoing bilateral oophorectomy and then instituting exemestane therapy. This was particularly important in regard to the fact that her grandmother had ovarian cancer even though the patient herself is BRCA negative.  #2. Continue tamoxifen 20 mg daily in the meantime.  #3. GYN appointment on 04/29/2014 to discuss TAH/BSO, discontinuation of tamoxifen, and introduction of exemestane.    All questions were answered. The patient knows to call the clinic with any problems, questions or concerns. We can certainly see the patient much sooner if necessary.   I spent 25 minutes counseling the patient face to face. The total time spent in the appointment was 30 minutes.    Doroteo Bradford, MD 02/16/2014 9:26 AM  DISCLAIMER:  This note was dictated with voice recognition software.  Similar sounding words can inadvertently be transcribed inaccurately and may not be corrected upon review.

## 2014-02-16 NOTE — Progress Notes (Addendum)
LABS TO BE DRAWN AT DR Maretta Bees OFFICE WITH [facility].

## 2014-02-16 NOTE — Patient Instructions (Signed)
[facility] Discharge Instructions  RECOMMENDATIONS MADE BY THE CONSULTANT AND ANY TEST RESULTS WILL BE SENT TO YOUR REFERRING PHYSICIAN.  Please have a CBC, CMET, CEA, and CA 27.29 drawn when you have blood work done for your wellness check. Return in 6 months for office visit and blood work (as above).   Thank you for choosing [facility] to provide your oncology and hematology care.  To afford each patient quality time with our providers, please arrive at least 15 minutes before your scheduled appointment time.  With your help, our goal is to use those 15 minutes to complete the necessary work-up to ensure our physicians have the information they need to help with your evaluation and healthcare recommendations.    Effective January 1st, 2014, we ask that you re-schedule your appointment with our physicians should you arrive 10 or more minutes late for your appointment.  We strive to give you quality time with our providers, and arriving late affects you and other patients whose appointments are after yours.    Again, thank you for choosing [facility].  Our hope is that these requests will decrease the amount of time that you wait before being seen by our physicians.       _____________________________________________________________  Should you have questions after your visit to [facility], please contact our office at [phone] between the hours of 8:30 a.m. and 4:30 p.m.  Voicemails left after 4:30 p.m. will not be returned until the following business day.  For prescription refill requests, have your pharmacy contact our office with your prescription refill request.    _______________________________________________________________  We hope that we have given you very good care.  You may receive a patient satisfaction survey in the mail, please complete it and return it as soon as possible.  We value your  feedback!  _______________________________________________________________  Have you asked about our STAR program?  STAR stands for Survivorship Training and Rehabilitation, and this is a nationally recognized cancer care program that focuses on survivorship and rehabilitation.  Cancer and cancer treatments may cause problems, such as, pain, making you feel tired and keeping you from doing the things that you need or want to do. Cancer rehabilitation can help. Our goal is to reduce these troubling effects and help you have the best quality of life possible.  You may receive a survey from a nurse that asks questions about your current state of health.  Based on the survey results, all eligible patients will be referred to the [facility] program for an evaluation so we can better serve you!  A frequently asked questions sheet is available upon request.

## 2014-02-23 ENCOUNTER — Ambulatory Visit
Admission: RE | Admit: 2014-02-23 | Discharge: 2014-02-23 | Disposition: A | Discharge status: Home / Self Care | Payer: BC Managed Care – PPO | Source: Ambulatory Visit | Attending: Oncology | Admitting: Oncology

## 2014-02-23 ENCOUNTER — Other Ambulatory Visit: Payer: Self-pay | Admitting: Oncology

## 2014-02-23 DIAGNOSIS — Z853 Personal history of malignant neoplasm of breast: Secondary | ICD-10-CM

## 2014-02-23 DIAGNOSIS — Z9889 Other specified postprocedural states: Secondary | ICD-10-CM

## 2014-03-17 ENCOUNTER — Other Ambulatory Visit (HOSPITAL_COMMUNITY): Payer: Self-pay | Admitting: Oncology

## 2014-03-17 DIAGNOSIS — C50911 Malignant neoplasm of unspecified site of right female breast: Secondary | ICD-10-CM

## 2014-03-17 MED ORDER — VENLAFAXINE HCL 37.5 MG PO TABS: 37.5000 mg | ORAL_TABLET | Freq: Two times a day (BID) | ORAL | Status: DC

## 2014-03-30 ENCOUNTER — Encounter (HOSPITAL_COMMUNITY): Payer: Self-pay

## 2014-04-29 ENCOUNTER — Encounter: Payer: Self-pay | Admitting: Adult Health

## 2014-04-29 ENCOUNTER — Ambulatory Visit (INDEPENDENT_AMBULATORY_CARE_PROVIDER_SITE_OTHER): Payer: BC Managed Care – PPO | Admitting: Adult Health

## 2014-04-29 VITALS — BP 140/90 | HR 76 | Ht 64.0 in | Wt 192.0 lb

## 2014-04-29 DIAGNOSIS — Z1212 Encounter for screening for malignant neoplasm of rectum: Secondary | ICD-10-CM

## 2014-04-29 DIAGNOSIS — C50911 Malignant neoplasm of unspecified site of right female breast: Secondary | ICD-10-CM

## 2014-04-29 DIAGNOSIS — E041 Nontoxic single thyroid nodule: Secondary | ICD-10-CM

## 2014-04-29 DIAGNOSIS — Z01419 Encounter for gynecological examination (general) (routine) without abnormal findings: Secondary | ICD-10-CM

## 2014-04-29 DIAGNOSIS — R195 Other fecal abnormalities: Secondary | ICD-10-CM

## 2014-04-29 HISTORY — DX: Other fecal abnormalities: R19.5

## 2014-04-29 LAB — COMPREHENSIVE METABOLIC PANEL
ALT: 27 U/L (ref 0–35)
AST: 28 U/L (ref 0–37)
Albumin: 3.9 g/dL (ref 3.5–5.2)
Alkaline Phosphatase: 38 U/L — ABNORMAL LOW (ref 39–117)
BILIRUBIN TOTAL: 0.5 mg/dL (ref 0.2–1.2)
BUN: 15 mg/dL (ref 6–23)
CALCIUM: 9.2 mg/dL (ref 8.4–10.5)
CO2: 25 mEq/L (ref 19–32)
Chloride: 102 mEq/L (ref 96–112)
Creat: 0.76 mg/dL (ref 0.50–1.10)
GLUCOSE: 89 mg/dL (ref 70–99)
Potassium: 4 mEq/L (ref 3.5–5.3)
Sodium: 136 mEq/L (ref 135–145)
TOTAL PROTEIN: 6.7 g/dL (ref 6.0–8.3)

## 2014-04-29 LAB — HEMOCCULT GUIAC POC 1CARD (OFFICE): Fecal Occult Blood, POC: POSITIVE

## 2014-04-29 LAB — LIPID PANEL
CHOL/HDL RATIO: 2.8 ratio
Cholesterol: 150 mg/dL (ref 0–200)
HDL: 54 mg/dL (ref >39)
LDL CALC: 57 mg/dL (ref 0–99)
Triglycerides: 193 mg/dL — ABNORMAL HIGH (ref <150)
VLDL: 39 mg/dL (ref 0–40)

## 2014-04-29 LAB — TSH: TSH: 4.824 u[IU]/mL — ABNORMAL HIGH (ref 0.350–4.500)

## 2014-04-29 NOTE — Patient Instructions (Signed)
Pap and physical in 1 year Mammogram yearly Colonoscopy 2023 Do 3 hemoccult cards Return in 2 weeks to talk with J[facility]

## 2014-04-29 NOTE — Progress Notes (Signed)
Patient ID: Vanessa Neal, female   DOB: 04/29/1961, 53 y.o.   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  Current Medications, Allergies, Past Medical History, Past Surgical History, Family History and Social History were reviewed in [employer] record.     Review of Systems: Patient denies any daily headaches, blurred vision, shortness of breath, chest pain, abdominal pain, problems with bowel movements(since on tamoxifen uses stool softener), urination, or intercourse.  No joint pain, moods good has hot flashes.   Physical Exam:BP 140/90 mmHg  Pulse 76  Ht 5\' 4"  (1.626 m)  Wt 192 lb (87.091 kg)  BMI 32.94 kg/m2 General:  Well developed, well nourished, no acute distress Skin:  Warm and dry Neck:  Midline trachea, thyroid slightly enlarged, has H/O stable right nodule,sp iodine therapy Lungs; Clear to auscultation bilaterally Breast:  No dominant palpable mass, retraction, or nipple discharge Cardiovascular: Regular rate and rhythm Abdomen:  Soft, non tender, no hepatosplenomegaly Pelvic:  External genitalia is normal in appearance.  The vagina is normal in appearance.The cervix is smooth.  Uterus is felt to be normal size, shape, and contour.  No  adnexal masses or tenderness noted. Rectal: Good sphincter tone, no polyps, or hemorrhoids felt.  Hemoccult positive. Extremities:  No swelling or varicosities noted Psych:  No mood changes, alert and cooperative,seems happy Discussed with set up appointment with Dr Glo Herring to discuss SCH/BSO or other options.   Impression: Well woman gyn exam no pap History of right breast cancer IDC Right thyroid nodule +  hemoccult    Plan: Will send 3 hemoccult cards home to do, had colonoscopy in 2013 with hemorrhoids,due next in 2023 Mammogram yearly Return in 2 weeks to talk with Dr Glo Herring about surgical options Pap and physical in 1 year Check CBC,CMP,TSH and lipids, CEA, CA 27.29 and [facility]

## 2014-04-30 LAB — CBC
HEMATOCRIT: 36.5 % (ref 36.0–46.0)
Hemoglobin: 12.7 g/dL (ref 12.0–15.0)
MCH: 33.7 pg (ref 26.0–34.0)
MCHC: 34.8 g/dL (ref 30.0–36.0)
MCV: 96.8 fL (ref 78.0–100.0)
MPV: 10.2 fL (ref 9.4–12.4)
Platelets: 241 10*3/uL (ref 150–400)
RBC: 3.77 MIL/uL — ABNORMAL LOW (ref 3.87–5.11)
RDW: 13.6 % (ref 11.5–15.5)
WBC: 6 10*3/uL (ref 4.0–10.5)

## 2014-04-30 LAB — FOLLICLE STIMULATING HORMONE: FSH: 20.8 m[IU]/mL

## 2014-04-30 LAB — CANCER ANTIGEN 27.29: CA 27.29: 16 U/mL (ref 0–39)

## 2014-04-30 LAB — CEA: CEA: 2.3 ng/mL (ref 0.0–5.0)

## 2014-05-01 ENCOUNTER — Telehealth: Payer: Self-pay | Admitting: Adult Health

## 2014-05-01 NOTE — Telephone Encounter (Signed)
Pt aware of labs, and that TSH 4.824 which is slightly elevated will recheck it in 3 months, put in recall.

## 2014-05-04 ENCOUNTER — Other Ambulatory Visit (INDEPENDENT_AMBULATORY_CARE_PROVIDER_SITE_OTHER): Payer: BC Managed Care – PPO

## 2014-05-04 DIAGNOSIS — Z1212 Encounter for screening for malignant neoplasm of rectum: Secondary | ICD-10-CM

## 2014-05-04 DIAGNOSIS — Z139 Encounter for screening, unspecified: Secondary | ICD-10-CM

## 2014-05-04 LAB — HEMOCCULT GUIAC POC 1CARD (OFFICE)
Card #2 Fecal Occult Blod, POC: NEGATIVE
FECAL OCCULT BLD: NEGATIVE
Fecal Occult Blood, POC: NEGATIVE

## 2014-05-04 NOTE — Progress Notes (Signed)
Left message letting pt know hemacult cards were all negative. Vanessa Neal

## 2014-05-14 ENCOUNTER — Ambulatory Visit (INDEPENDENT_AMBULATORY_CARE_PROVIDER_SITE_OTHER): Payer: BC Managed Care – PPO | Admitting: Obstetrics and Gynecology

## 2014-05-14 ENCOUNTER — Encounter: Payer: Self-pay | Admitting: Obstetrics and Gynecology

## 2014-05-14 VITALS — BP 130/90 | Ht 64.0 in | Wt 191.0 lb

## 2014-05-14 DIAGNOSIS — Z853 Personal history of malignant neoplasm of breast: Secondary | ICD-10-CM

## 2014-05-14 DIAGNOSIS — C50911 Malignant neoplasm of unspecified site of right female breast: Secondary | ICD-10-CM

## 2014-05-14 NOTE — Progress Notes (Signed)
Patient ID: Vanessa Neal, female   DOB: 03-Aug-1960, 53 y.o.   MRN: [id number] Pt here today to discuss a hysterectomy. Pt is wanting to start a new cancer drug and needs to have this done. Wants to discuss the different surgeries.

## 2014-05-14 NOTE — Progress Notes (Addendum)
Patient ID: Vanessa Neal, female   DOB: [birth year], 53 y.o.   MRN: [id number]   [facility] Visit  Patient name: Vanessa Neal MRN [id number]  Date of birth: [birth year]  CC & HPI:  Vanessa Neal is a 53 y.o. female presenting today to discuss a hysterectomy.  She has a history of breast cancer and needs to have the procedure done before she starts a new cancer medication.  she would like to leave her cervix.  She denies bladder problems and constipation as associated symptoms.  She does not have a history of abnormal pap smears.     ROS:  All systems have been reviewed and are negative unless otherwise specified in the HPI.  Pertinent History Reviewed:   Reviewed: Significant for breast cancer, two cesarean sections Medical         Past Medical History  Diagnosis Date  . Breast cancer   . Ovarian cyst   . 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 BRCA negative 02/14/2013  . Right thyroid nodule   . Fecal occult blood test positive 04/29/2014                              Surgical Hx:    Past Surgical History  Procedure Laterality Date  . Cholecystectomy    . Breast lumpectomy      right  . Cesarean section      X 2  . Breast cyst excision    . Lipoma excision      from back  . Colonoscopy  03/11/2012    Procedure: COLONOSCOPY;  Surgeon: Danie Binder, MD;  [facility];  Service: Endoscopy;  Laterality: N/A;  9:30 AM-changed to 9:50 Doris notified  . Breast cyst aspiration  01/2012   Medications: Reviewed & Updated - see associated section                      Current outpatient prescriptions: calcium-vitamin D (OSCAL WITH D) 500-200 MG-UNIT per tablet, Take 1 tablet by mouth 2 (two) times daily. , Disp: , Rfl: ;  docusate sodium (COLACE) 100 MG capsule, Take 100 mg by mouth daily., Disp: , Rfl: ;  Multiple Vitamins-Calcium (ONE-A-DAY WOMENS PO), Take by mouth daily., Disp: , Rfl: ;  tamoxifen (NOLVADEX) 20 MG tablet, Take 1 tablet (20 mg  total) by mouth daily., Disp: 90 tablet, Rfl: 3 venlafaxine (EFFEXOR) 37.5 MG tablet, Take 1 tablet (37.5 mg total) by mouth 2 (two) times daily., Disp: 60 tablet, Rfl: 2   Social History: Reviewed -  reports that she has never smoked. She has never used smokeless tobacco.  Objective Findings:  Vitals: Blood pressure 130/90, height $RemoveBefore'5\' 4"'PmXGfFlVoyiWz$  (1.626 m), weight 191 lb (86.637 kg).  Physical Examination:  ABDOMEN - well-healed surgical scar with retraction on left side of prior c-section scar.  Patient requests the revision of scar.     Assessment & Plan:   A:  1. Breast cancer survivor 2. Surgical candidate for hysterectomy and BSO as part of cancer risk reduction.  P:  1. Open, supracervical hysterectomy, with revision of old cesarean scar, to be done in mid- to late- January 2. Pt requests that pap be rechecked prior to surgery, so preOp needs to be in early Jan, 1-2 wk before surgery.   This chart was scribed for Jonnie Kind, MD by Donato Schultz, ED Scribe. The patient's care  was started at 9:01 AM.

## 2014-06-03 ENCOUNTER — Ambulatory Visit (INDEPENDENT_AMBULATORY_CARE_PROVIDER_SITE_OTHER): Payer: BLUE CROSS/BLUE SHIELD | Admitting: Obstetrics and Gynecology

## 2014-06-03 ENCOUNTER — Encounter: Payer: Self-pay | Admitting: Obstetrics and Gynecology

## 2014-06-03 ENCOUNTER — Other Ambulatory Visit (HOSPITAL_COMMUNITY)
Admission: RE | Admit: 2014-06-03 | Discharge: 2014-06-03 | Disposition: A | Discharge status: Home / Self Care | Payer: BLUE CROSS/BLUE SHIELD | Source: Ambulatory Visit | Attending: Obstetrics and Gynecology | Admitting: Obstetrics and Gynecology

## 2014-06-03 VITALS — BP 164/112 | Ht 64.0 in | Wt 191.5 lb

## 2014-06-03 DIAGNOSIS — Z01818 Encounter for other preprocedural examination: Secondary | ICD-10-CM

## 2014-06-03 DIAGNOSIS — Z01419 Encounter for gynecological examination (general) (routine) without abnormal findings: Secondary | ICD-10-CM

## 2014-06-03 DIAGNOSIS — Z1151 Encounter for screening for human papillomavirus (HPV): Secondary | ICD-10-CM | POA: Diagnosis present

## 2014-06-03 DIAGNOSIS — Z113 Encounter for screening for infections with a predominantly sexual mode of transmission: Secondary | ICD-10-CM | POA: Diagnosis present

## 2014-06-03 DIAGNOSIS — Z853 Personal history of malignant neoplasm of breast: Secondary | ICD-10-CM

## 2014-06-03 NOTE — Progress Notes (Signed)
Patient ID: Vanessa Neal, female   DOB: [birth year], 54 y.o.   MRN: [id number] Pt here today for pre op exam. Pt's surgery is scheduled for 06/16/14.

## 2014-06-03 NOTE — Progress Notes (Signed)
Preoperative History and Physical  Vanessa Neal is a 54 y.o. G2P2 who is here for a preop for supracervical abdominal hysterectomy that is scheduled for 06/16/14. She notes that she would also like her cervix removed as well. She denies any abnormal pap smears. She denies any other symptoms. She denies any treatment procedures on her cervix.    PMH:    Past Medical History  Diagnosis Date  . Breast cancer   . Ovarian cyst   . 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 BRCA negative 02/14/2013  . Right thyroid nodule   . Fecal occult blood test positive 04/29/2014    PSH:     Past Surgical History  Procedure Laterality Date  . Cholecystectomy    . Breast lumpectomy      right  . Cesarean section      X 2  . Breast cyst excision    . Lipoma excision      from back  . Colonoscopy  03/11/2012    Procedure: COLONOSCOPY;  Surgeon: Danie Binder, MD;  Location: [facility];  Service: Endoscopy;  Laterality: N/A;  9:30 AM-changed to 9:50 Doris notified  . Breast cyst aspiration  01/2012    POb/GynH:      OB History    Gravida Para Term Preterm AB TAB SAB Ectopic Multiple Living   '2 2        2      '$ SH:   History  Substance Use Topics  . Smoking status: Never Smoker   . Smokeless tobacco: Never Used  . Alcohol Use: No    FH:    Family History  Problem Relation Age of Onset  . Cancer Mother     breast  . Kidney failure Father   . Cancer Maternal Grandmother     colon  . Colon cancer Maternal Grandmother   . Cancer Paternal Grandmother     ovarian  . Cancer Sister     melanoma     Allergies: No Known Allergies  Medications:      Current outpatient prescriptions: calcium-vitamin D (OSCAL WITH D) 500-200 MG-UNIT per tablet, Take 1 tablet by mouth 2 (two) times daily. , Disp: , Rfl: ;  docusate sodium (COLACE) 100 MG capsule, Take 100 mg by mouth daily., Disp: , Rfl: ;  Multiple Vitamins-Calcium (ONE-A-DAY WOMENS PO), Take by mouth daily., Disp:  , Rfl: ;  tamoxifen (NOLVADEX) 20 MG tablet, Take 1 tablet (20 mg total) by mouth daily., Disp: 90 tablet, Rfl: 3 venlafaxine (EFFEXOR) 37.5 MG tablet, Take 1 tablet (37.5 mg total) by mouth 2 (two) times daily., Disp: 60 tablet, Rfl: 2  Review of Systems:   Review of Systems  A complete 10 system review of systems was obtained and all systems are negative except as noted in the HPI and PMH.      PHYSICAL EXAM:  Blood pressure 164/112, height $RemoveBeforeD'5\' 4"'[id number]$  (1.626 m), weight 191 lb 8 oz (86.864 kg).    Vitals reviewed. Physical Examination:  General appearance - alert, well appearing, and in no distress and oriented to person, place, and time Mental status - alert, oriented to person, place, and time, normal mood, behavior, speech, dress, motor activity, and thought processes Pelvic - normal external genitalia, vulva, vagina, cervix, uterus and adnexa,  VULVA: normal appearing vulva with no masses, tenderness or lesions,  VAGINA: normal appearing vagina with normal color and discharge, no lesions,  CERVIX: normal appearing cervix without discharge or  lesions, cervix is deviated to the patient right.  UTERUS: uterus is normal size, shape, consistency and nontender,  ADNEXA: normal adnexa in size, nontender and no masses  Endometrial Biopsy: not completed because of the cervix    Assessment: Patient Active Problem List   Diagnosis Date Noted  . Fecal occult blood test positive 04/29/2014  . Right thyroid nodule 04/21/2013  . BRCA negative 02/14/2013  . 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 Pre-op Exam for supracervical abdominal hysterectomy.  Plan: 1. Pap smear completed in office.  2. Pelvic US to be scheduled for 06/05/14 at 9 AM to assess endometial thickness. 3 will proceed toward abdominal hysterectomy with bilateral salpingoophorectomy on 05/29/14    This chart was scribed for Jonnie Kind, MD by Steva Colder, ED Scribe. The patient was seen in  room 1 at 9:16 AM.

## 2014-06-04 ENCOUNTER — Other Ambulatory Visit: Payer: Self-pay | Admitting: Obstetrics and Gynecology

## 2014-06-04 DIAGNOSIS — C50919 Malignant neoplasm of unspecified site of unspecified female breast: Secondary | ICD-10-CM

## 2014-06-04 LAB — CYTOLOGY - PAP

## 2014-06-05 ENCOUNTER — Ambulatory Visit (INDEPENDENT_AMBULATORY_CARE_PROVIDER_SITE_OTHER): Payer: BLUE CROSS/BLUE SHIELD

## 2014-06-05 DIAGNOSIS — C50919 Malignant neoplasm of unspecified site of unspecified female breast: Secondary | ICD-10-CM

## 2014-06-08 ENCOUNTER — Telehealth: Payer: Self-pay | Admitting: Obstetrics and Gynecology

## 2014-06-08 NOTE — Telephone Encounter (Signed)
Pt u/s reviewed, discussed with Pt , and Dr Denman George. Pt with no abnormal uterine bleeding ,and endometrial stripe 7.3 mm on Tamoxifen x 4 yr. I was unable to perform Endometrial bx due to cervical stenosis at last office visit. Pt requests that endometrial bx not be required unless absolutely necessary. Pro's and Con's discussed with pt. Case reviewed with Dr Denman George, who agrees that proceeding directly to [facility] is reasonable tx plan. Pt informed of discussion. Pt preop moved to 8 am on 14 Jan.

## 2014-06-10 ENCOUNTER — Other Ambulatory Visit: Payer: BLUE CROSS/BLUE SHIELD | Admitting: Obstetrics and Gynecology

## 2014-06-10 NOTE — Patient Instructions (Signed)
Vanessa Neal  06/10/2014   Your procedure is scheduled on:   06/16/2014  Report to [facility] at  70  AM.  Call this number if you have problems the morning of surgery: [phone]   Remember:   Do not eat food or drink liquids after midnight.   Take these medicines the morning of surgery with A SIP OF WATER:  Tamoxifen, effexor   Do not wear jewelry, make-up or nail polish.  Do not wear lotions, powders, or perfumes.   Do not shave 48 hours prior to surgery. Men may shave face and neck.  Do not bring valuables to the hospital.  [facility] is not responsible for any belongings or valuables.               Contacts, dentures or bridgework may not be worn into surgery.  Leave suitcase in the car. After surgery it may be brought to your room.  For patients admitted to the hospital, discharge time is determined by your treatment team.               Patients discharged the day of surgery will not be allowed to drive home.  Name and phone number of your driver: family  Special Instructions: Shower using CHG 2 nights before surgery and the night before surgery.  If you shower the day of surgery use CHG.  Use special wash - you have one bottle of CHG for all showers.  You should use approximately 1/3 of the bottle for each shower.   Please read over the following fact sheets that you were given: Pain Booklet, Coughing and Deep Breathing, Blood Transfusion Information, Surgical Site Infection Prevention, Anesthesia Post-op Instructions and Care and Recovery After Surgery Salpingectomy Salpingectomy, also called tubectomy, is the surgical removal of one of the fallopian tubes. The fallopian tubes are tubes that are connected to the uterus. These tubes transport the egg from the ovary to the uterus. A salpingectomy may be done for various reasons, including:   A tubal (ectopic) pregnancy. This is especially true if the tube ruptures.  An infected fallopian tube.  The need to remove  the fallopian tube when removing an ovary with a cyst or tumor.  The need to remove the fallopian tube when removing the uterus.  Cancer of the fallopian tube or nearby organs. Removing one fallopian tube does not prevent you from becoming pregnant. It also does not cause problems with your menstrual periods.  LET [facility] CARE PROVIDER KNOW ABOUT:  Any allergies you have.  All medicines you are taking, including vitamins, herbs, eye drops, creams, and over-the-counter medicines.  Previous problems you or members of your family have had with the use of anesthetics.  Any blood disorders you have.  Previous surgeries you have had.  Medical conditions you have. RISKS AND COMPLICATIONS  Generally, this is a safe procedure. However, as with any procedure, complications can occur. Possible complications include:  Injury to surrounding organs.  Bleeding.  Infection.  Problems related to anesthesia. BEFORE THE PROCEDURE  Ask your health care provider about changing or stopping your regular medicines. You may need to stop taking certain medicines, such as aspirin or blood thinners, at least 1 week before the surgery.  Do not eat or drink anything for at least 8 hours before the surgery.  If you smoke, do not smoke for at least 2 weeks before the surgery.  Make plans to have someone drive you home  after the procedure or after your hospital stay. Also arrange for someone to help you with activities during recovery. PROCEDURE   You will be given medicine to help you relax before the procedure (sedative). You will then be given medicine to make you sleep through the procedure (general anesthetic). These medicines will be given through an IV access tube that is put into one of your veins.  Once you are asleep, your lower abdomen will be shaved and cleaned. A thin, flexible tube (catheter) will be placed in your bladder.  The surgeon may use a laparoscopic, robotic, or open technique  for this surgery:  In the laparoscopic technique, the surgery is done through two small cuts (incisions) in the abdomen. A thin, lighted tube with a tiny camera on the end (laparoscope) is inserted into one of the incisions. The tools needed for the procedure are put through the other incision.  A robotic technique may be chosen to perform complex surgery in a small space. In the robotic technique, small incisions will be made. A camera and surgical instruments are passed through the incisions. Surgical instruments will be controlled with the help of a robotic arm.  In the open technique, the surgery is done through one large incision in the abdomen.  Using any of these techniques, the surgeon removes the fallopian tube from where it attaches to the uterus. The blood vessels will be clamped and tied.  The surgeon then uses staples or stitches to close the incision or incisions. AFTER THE PROCEDURE   You will be taken to a recovery area where your progress will be monitored for 1-3 hours.  If the laparoscopic technique was used, you may be allowed to go home after several hours. You may have some shoulder pain after the laparoscopic procedure. This is normal and usually goes away in a day or two.  If the open technique was used, you will be admitted to the hospital for a couple of days.  You will be given pain medicine if needed.  The IV access tube and catheter will be removed before you are discharged. Document Released: 10/01/2008 Document Revised: 03/05/2013 Document Reviewed: 11/06/2012 [facility] Patient Information 2015 [place], [place]. This information is not intended to replace advice given to you by your health care provider. Make sure you discuss any questions you have with your health care provider. Supracervical Hysterectomy A supracervical hysterectomy is surgery to remove the top part of the uterus, but not the cervix. You will no longer have menstrual periods or be able to get  pregnant after this surgery. The fallopian tubes and ovaries may also be removed (bilateral salpingo-oophorectomy) during this surgery. This surgery is usually performed using a minimally invasive technique called laparoscopy. This technique allows the surgery to be done through small incisions. The minimally invasive technique provides benefits such as less pain, less risk of infection, and shorter recovery time. LET [facility] CARE PROVIDER KNOW ABOUT:  Any allergies you have.  All medicines you are taking, including vitamins, herbs, eye drops, creams, and over-the-counter medicines.  Previous problems you or members of your family have had with the use of anesthetics.  Any blood disorders you have.  Previous surgeries you have had.  Medical conditions you have. RISKS AND COMPLICATIONS  Generally, this is a safe procedure. However, as with any procedure, complications can occur. Possible complications include:  Bleeding.  Blood clots in the legs or lung.  Infection.  Injury to surrounding organs.  Problems related to anesthesia.  Conversion to an open abdominal surgery.  Additional surgery later to remove the cervix if you have problems with the cervix. BEFORE THE PROCEDURE  Ask your health care provider about changing or stopping your regular medicines.  Do not take aspirin or blood thinners (anticoagulants) for 1 week before the surgery, or as directed by your health care provider.  Do not eat or drink anything for 8 hours before the surgery, or as directed by your health care provider.  Quit smoking if you smoke.  Arrange for a ride home after surgery and for someone to help you at home during recovery. PROCEDURE   You will be given an antibiotic medicine.  An IV tube will be placed in one of your veins. You will be given medicine to make you sleep (general anesthetic).  A gas (carbon dioxide) will be used to inflate your abdomen. This will allow your surgeon to  look inside your abdomen, perform your surgery, and treat any other problems found if necessary.  Three or four small incisions will be made in your abdomen. One of these incisions will be made in the area of your belly button (navel). A thin, flexible tube with a tiny camera and light on the end of it (laparoscope) will be inserted into the incision. The camera on the laparoscope sends a picture to a TV screen in the operating room. This gives your surgeon a good view inside the abdomen.  Other surgical instruments will be inserted through the other incisions.  The uterus will be cut into small pieces and removed through the small incisions.  Your incisions will be closed. AFTER THE PROCEDURE   You will be taken to a recovery area where your progress will be monitored until you are awake, stable, and taking fluids well. If there are no other problems, you will then be moved to a regular hospital room, or you will be allowed to go home.  You will likely have minimal discomfort after the surgery because the incisions are so small with the laparoscopic technique.  You will be given pain medicine while you are in the hospital and for when you go home.  If a bilateral salpingo-oophorectomy was performed before menopause, you will go through a sudden (abrupt) menopause. This can be helped with hormone medicines. Document Released: 11/01/2007 Document Revised: 03/05/2013 Document Reviewed: 11/15/2012 [facility] Patient Information 2015 [place], [place]. This information is not intended to replace advice given to you by your health care provider. Make sure you discuss any questions you have with your health care provider. PATIENT INSTRUCTIONS POST-ANESTHESIA  IMMEDIATELY FOLLOWING SURGERY:  Do not drive or operate machinery for the first twenty four hours after surgery.  Do not make any important decisions for twenty four hours after surgery or while taking narcotic pain medications or sedatives.  If  you develop intractable nausea and vomiting or a severe headache please notify your doctor immediately.  FOLLOW-UP:  Please make an appointment with your surgeon as instructed. You do not need to follow up with anesthesia unless specifically instructed to do so.  WOUND CARE INSTRUCTIONS (if applicable):  Keep a dry clean dressing on the anesthesia/puncture wound site if there is drainage.  Once the wound has quit draining you may leave it open to air.  Generally you should leave the bandage intact for twenty four hours unless there is drainage.  If the epidural site drains for more than 36-48 hours please call the anesthesia department.  QUESTIONS?:  Please feel free to  call your physician or the hospital operator if you have any questions, and they will be happy to assist you.

## 2014-06-11 ENCOUNTER — Other Ambulatory Visit (HOSPITAL_COMMUNITY): Payer: BC Managed Care – PPO

## 2014-06-11 ENCOUNTER — Encounter (HOSPITAL_COMMUNITY): Payer: Self-pay

## 2014-06-11 ENCOUNTER — Encounter (HOSPITAL_COMMUNITY)
Admission: RE | Admit: 2014-06-11 | Discharge: 2014-06-11 | Disposition: A | Discharge status: Home / Self Care | Payer: BLUE CROSS/BLUE SHIELD | Source: Ambulatory Visit | Attending: Obstetrics and Gynecology | Admitting: Obstetrics and Gynecology

## 2014-06-11 DIAGNOSIS — Z01419 Encounter for gynecological examination (general) (routine) without abnormal findings: Secondary | ICD-10-CM | POA: Insufficient documentation

## 2014-06-11 DIAGNOSIS — Z01812 Encounter for preprocedural laboratory examination: Secondary | ICD-10-CM | POA: Diagnosis not present

## 2014-06-11 LAB — URINALYSIS, ROUTINE W REFLEX MICROSCOPIC
Bilirubin Urine: NEGATIVE
Glucose, UA: NEGATIVE mg/dL
KETONES UR: NEGATIVE mg/dL
LEUKOCYTES UA: NEGATIVE
Nitrite: NEGATIVE
PH: 6 (ref 5.0–8.0)
PROTEIN: NEGATIVE mg/dL
Specific Gravity, Urine: 1.025 (ref 1.005–1.030)
UROBILINOGEN UA: 0.2 mg/dL (ref 0.0–1.0)

## 2014-06-11 LAB — COMPREHENSIVE METABOLIC PANEL
ALK PHOS: 41 U/L (ref 39–117)
ALT: 31 U/L (ref 0–35)
AST: 28 U/L (ref 0–37)
Albumin: 3.9 g/dL (ref 3.5–5.2)
Anion gap: 6 (ref 5–15)
BUN: 17 mg/dL (ref 6–23)
CO2: 27 mmol/L (ref 19–32)
Calcium: 9.5 mg/dL (ref 8.4–10.5)
Chloride: 104 mEq/L (ref 96–112)
Creatinine, Ser: 0.7 mg/dL (ref 0.50–1.10)
GFR calc Af Amer: >90 mL/min (ref >90)
GFR calc non Af Amer: >90 mL/min (ref >90)
GLUCOSE: 102 mg/dL — AB (ref 70–99)
Potassium: 4.2 mmol/L (ref 3.5–5.1)
SODIUM: 137 mmol/L (ref 135–145)
TOTAL PROTEIN: 7.1 g/dL (ref 6.0–8.3)
Total Bilirubin: 0.7 mg/dL (ref 0.3–1.2)

## 2014-06-11 LAB — CBC
HCT: 38.1 % (ref 36.0–46.0)
Hemoglobin: 13.2 g/dL (ref 12.0–15.0)
MCH: 35.2 pg — ABNORMAL HIGH (ref 26.0–34.0)
MCHC: 34.6 g/dL (ref 30.0–36.0)
MCV: 101.6 fL — ABNORMAL HIGH (ref 78.0–100.0)
Platelets: 219 10*3/uL (ref 150–400)
RBC: 3.75 MIL/uL — ABNORMAL LOW (ref 3.87–5.11)
RDW: 13 % (ref 11.5–15.5)
WBC: 5.8 10*3/uL (ref 4.0–10.5)

## 2014-06-11 LAB — URINE MICROSCOPIC-ADD ON

## 2014-06-11 LAB — HCG, SERUM, QUALITATIVE: Preg, Serum: NEGATIVE

## 2014-06-11 NOTE — Pre-Procedure Instructions (Signed)
Patient given information to sign up for my chart at home. 

## 2014-06-12 NOTE — H&P (Signed)
Vanessa Neal is an 54 y.o. female. She is being admitted for Abdominal hysterectomy, with bilateral salpingoophorectomy,  With revision of her old cesarean section scar during access to the pelvis. Ms Mcbain is currently premenopausal, and is being considered for treatment for her recent breast cancer with an aromatase inhibitor, for which she is not eligible until postmenopausal by natural or surgical means, so she is referred to our office due to desire to start this Aromatase inhibitor tx ASAP.  In disucssion of surgical options, the patient desires full hysterectomy, total abdominal hysterectomy, bilateral salpingo- oophorectomy, with scar revision, from her prior cesarean sections.  Initial discussions were made  As to whether to remove the cervix, but after discussion with providers and her family , she desires removal of her cervix as well as the uterus and ovaries.  Efforts to perform endometrial biopsy were unsuccessful due to Cervical stenosis, and pelvic u/s showed a thin endometrium at 7 mm, considered normal for premenopausal status, and this case was discussed with Dr Denman George, Gyn Oncologist,[place], as to whether proceeding directly to hysterectomy with these findings was reasonable, and she concurred. Pap is normal.  Pertinent Gynecological History: Menses: flow is light Bleeding:  Contraception: abstinence DES exposure: unknown Blood transfusions: none Sexually transmitted diseases: no past history Previous GYN Procedures: cryo  Last mammogram: history of ductal carcinoma right breast Date: 2012 Last pap: normal Date: 05/2014 OB History: G, P   Menstrual History: Menarche age:  No LMP recorded. Patient is not currently having periods (Reason: Other).    Past Medical History  Diagnosis Date  . Breast cancer   . Ovarian cyst   . 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 BRCA negative 02/14/2013  . Right thyroid nodule   . Fecal occult blood test  positive 04/29/2014    Past Surgical History  Procedure Laterality Date  . Cholecystectomy    . Cesarean section      X 2  . Lipoma excision      from back  . Colonoscopy  03/11/2012    Procedure: COLONOSCOPY;  Surgeon: Danie Binder, MD;  Location: [facility];  Service: Endoscopy;  Laterality: N/A;  9:30 AM-changed to 9:50 Doris notified  . Breast lumpectomy      right  . Breast cyst excision    . Breast cyst aspiration  01/2012  . Mastectomy Right     Family History  Problem Relation Age of Onset  . Cancer Mother     breast  . Kidney failure Father   . Cancer Maternal Grandmother     colon  . Colon cancer Maternal Grandmother   . Cancer Paternal Grandmother     ovarian  . Cancer Sister     melanoma    Social History:  reports that she has never smoked. She has never used smokeless tobacco. She reports that she does not drink alcohol or use illicit drugs.  Allergies: No Known Allergies  No prescriptions prior to admission    ROS no recent change in overall health  There were no vitals taken for this visit. Physical Exam  Constitutional: She is oriented to person, place, and time. She appears well-developed and well-nourished.  HENT:  Head: Normocephalic.  Eyes: Pupils are equal, round, and reactive to light.  Neck: Neck supple.  Cardiovascular: Normal rate.   Respiratory: Effort normal.  GI: Soft.  Cesarean section scar with retraction on the left side from recurrent surgery in the same line. Pt requests  revision to reduce asymmetry, and this process, and its limitations, have been discussed.   Genitourinary: Vagina normal and uterus normal.  Adnexa normal by  U/s. Minimal endometrial thickness 7.3 mm, unable to biopsy due to cervical stenosis.  Musculoskeletal: Normal range of motion.  Neurological: She is alert and oriented to person, place, and time. She has normal reflexes.  Skin: Skin is warm and dry.  Psychiatric: She has a normal mood and affect.  Her behavior is normal. Judgment and thought content normal.    No results found for this or any previous visit (from the past 24 hour(s)). CBC    Component Value Date/Time   WBC 5.8 06/11/2014 0831   RBC 3.75* 06/11/2014 0831   HGB 13.2 06/11/2014 0831   HCT 38.1 06/11/2014 0831   PLT 219 06/11/2014 0831   MCV 101.6* 06/11/2014 0831   MCH 35.2* 06/11/2014 0831   MCHC 34.6 06/11/2014 0831   RDW 13.0 06/11/2014 0831   LYMPHSABS 2.8 08/15/2013 0946   MONOABS 0.4 08/15/2013 0946   EOSABS 0.1 08/15/2013 0946   BASOSABS 0.0 08/15/2013 0946    CMP     Component Value Date/Time   NA 137 06/11/2014 0825   K 4.2 06/11/2014 0825   CL 104 06/11/2014 0825   CO2 27 06/11/2014 0825   GLUCOSE 102* 06/11/2014 0825   BUN 17 06/11/2014 0825   CREATININE 0.70 06/11/2014 0825   CREATININE 0.76 04/29/2014 0919   CALCIUM 9.5 06/11/2014 0825   PROT 7.1 06/11/2014 0825   ALBUMIN 3.9 06/11/2014 0825   AST 28 06/11/2014 0825   ALT 31 06/11/2014 0825   ALKPHOS 41 06/11/2014 0825   BILITOT 0.7 06/11/2014 0825   GFRNONAA >90 06/11/2014 0825   GFRAA >90 06/11/2014 0825     No results found.  Assessment/Plan: 1 Infiltrating Ductal Carcinoma of Breast, with planned change in therapy warranting Hysterectomy  2. History of Cesarean section x 3 with poorly healed abd scar.  Plan; Total abdominal hysterectomy, bilateral salpingoophorectomy, with scar revision Risks, benefit, rationale reviewed with the patient  Natnael Biederman V 06/12/2014, 11:31 AM

## 2014-06-15 ENCOUNTER — Other Ambulatory Visit (HOSPITAL_COMMUNITY): Payer: Self-pay | Admitting: Oncology

## 2014-06-15 DIAGNOSIS — C50911 Malignant neoplasm of unspecified site of right female breast: Secondary | ICD-10-CM

## 2014-06-15 MED ORDER — VENLAFAXINE HCL 37.5 MG PO TABS: 37.5000 mg | ORAL_TABLET | Freq: Two times a day (BID) | ORAL | Status: DC

## 2014-06-16 ENCOUNTER — Encounter (HOSPITAL_COMMUNITY): Payer: Self-pay | Admitting: *Deleted

## 2014-06-16 ENCOUNTER — Inpatient Hospital Stay (HOSPITAL_COMMUNITY)
Admission: RE | Admit: 2014-06-16 | Discharge: 2014-06-18 | DRG: 572 | Disposition: A | Discharge status: Home / Self Care | Payer: BLUE CROSS/BLUE SHIELD | Source: Ambulatory Visit | Attending: Obstetrics and Gynecology | Admitting: Obstetrics and Gynecology

## 2014-06-16 ENCOUNTER — Encounter (HOSPITAL_COMMUNITY)
Admission: RE | Disposition: A | Discharge status: Home / Self Care | Payer: Self-pay | Source: Ambulatory Visit | Attending: Obstetrics and Gynecology

## 2014-06-16 ENCOUNTER — Inpatient Hospital Stay (HOSPITAL_COMMUNITY): Payer: BLUE CROSS/BLUE SHIELD | Admitting: Anesthesiology

## 2014-06-16 DIAGNOSIS — Z9079 Acquired absence of other genital organ(s): Secondary | ICD-10-CM

## 2014-06-16 DIAGNOSIS — Z90710 Acquired absence of both cervix and uterus: Secondary | ICD-10-CM

## 2014-06-16 DIAGNOSIS — N838 Other noninflammatory disorders of ovary, fallopian tube and broad ligament: Secondary | ICD-10-CM

## 2014-06-16 DIAGNOSIS — Z90722 Acquired absence of ovaries, bilateral: Secondary | ICD-10-CM

## 2014-06-16 DIAGNOSIS — C50911 Malignant neoplasm of unspecified site of right female breast: Secondary | ICD-10-CM | POA: Diagnosis present

## 2014-06-16 DIAGNOSIS — Z803 Family history of malignant neoplasm of breast: Secondary | ICD-10-CM | POA: Diagnosis not present

## 2014-06-16 DIAGNOSIS — L905 Scar conditions and fibrosis of skin: Secondary | ICD-10-CM | POA: Diagnosis present

## 2014-06-16 DIAGNOSIS — Z9011 Acquired absence of right breast and nipple: Secondary | ICD-10-CM | POA: Diagnosis present

## 2014-06-16 DIAGNOSIS — D251 Intramural leiomyoma of uterus: Secondary | ICD-10-CM

## 2014-06-16 DIAGNOSIS — N840 Polyp of corpus uteri: Secondary | ICD-10-CM

## 2014-06-16 DIAGNOSIS — L720 Epidermal cyst: Secondary | ICD-10-CM

## 2014-06-16 HISTORY — PX: SALPINGOOPHORECTOMY: SHX82

## 2014-06-16 HISTORY — PX: SCAR REVISION: SHX5285

## 2014-06-16 HISTORY — PX: ABDOMINAL HYSTERECTOMY: SHX81

## 2014-06-16 SURGERY — HYSTERECTOMY, ABDOMINAL
Anesthesia: General

## 2014-06-16 MED ORDER — SODIUM CHLORIDE 0.9 % IJ SOLN: 9.0000 mL | INTRAMUSCULAR | Status: DC | PRN

## 2014-06-16 MED ORDER — ONDANSETRON HCL 4 MG PO TABS: 4.0000 mg | ORAL_TABLET | Freq: Four times a day (QID) | ORAL | Status: DC | PRN

## 2014-06-16 MED ORDER — KETOROLAC TROMETHAMINE 30 MG/ML IJ SOLN
30.0000 mg | Freq: Four times a day (QID) | INTRAMUSCULAR | Status: DC
  Filled 2014-06-16: qty 1

## 2014-06-16 MED ORDER — MIDAZOLAM HCL 2 MG/2ML IJ SOLN
INTRAMUSCULAR | Status: AC
  Filled 2014-06-16: qty 2

## 2014-06-16 MED ORDER — NEOSTIGMINE METHYLSULFATE 10 MG/10ML IV SOLN
INTRAVENOUS | Status: DC | PRN
  Administered 2014-06-16 (×2): 1 mg via INTRAVENOUS

## 2014-06-16 MED ORDER — LIDOCAINE HCL 1 % IJ SOLN
INTRAMUSCULAR | Status: DC | PRN
  Administered 2014-06-16: 30 mg via INTRADERMAL

## 2014-06-16 MED ORDER — CEFAZOLIN SODIUM-DEXTROSE 2-3 GM-% IV SOLR
INTRAVENOUS | Status: DC | PRN
  Administered 2014-06-16: 2 g via INTRAVENOUS

## 2014-06-16 MED ORDER — NALOXONE HCL 0.4 MG/ML IJ SOLN: 0.4000 mg | INTRAMUSCULAR | Status: DC | PRN

## 2014-06-16 MED ORDER — OXYCODONE-ACETAMINOPHEN 5-325 MG PO TABS: 1.0000 | ORAL_TABLET | ORAL | Status: DC | PRN

## 2014-06-16 MED ORDER — KETOROLAC TROMETHAMINE 30 MG/ML IJ SOLN
30.0000 mg | Freq: Once | INTRAMUSCULAR | Status: AC
  Administered 2014-06-16: 30 mg via INTRAVENOUS
  Filled 2014-06-16: qty 1

## 2014-06-16 MED ORDER — DIPHENHYDRAMINE HCL 50 MG/ML IJ SOLN: 12.5000 mg | Freq: Four times a day (QID) | INTRAMUSCULAR | Status: DC | PRN

## 2014-06-16 MED ORDER — HYDROMORPHONE 0.3 MG/ML IV SOLN
INTRAVENOUS | Status: DC
  Administered 2014-06-16: 0.3 mg via INTRAVENOUS
  Administered 2014-06-16: 1.5 mg via INTRAVENOUS
  Administered 2014-06-17 (×2): 0.6 mg via INTRAVENOUS
  Administered 2014-06-17: 0.9 mg via INTRAVENOUS
  Administered 2014-06-18: 0.3 mg via INTRAVENOUS
  Filled 2014-06-16 (×2): qty 25

## 2014-06-16 MED ORDER — ONDANSETRON HCL 4 MG/2ML IJ SOLN
INTRAMUSCULAR | Status: AC
  Filled 2014-06-16: qty 2

## 2014-06-16 MED ORDER — 0.9 % SODIUM CHLORIDE (POUR BTL) OPTIME
TOPICAL | Status: DC | PRN
  Administered 2014-06-16 (×2): 1000 mL

## 2014-06-16 MED ORDER — TAMOXIFEN CITRATE 10 MG PO TABS
20.0000 mg | ORAL_TABLET | Freq: Every day | ORAL | Status: DC
  Administered 2014-06-16 – 2014-06-17 (×2): 20 mg via ORAL
  Filled 2014-06-16 (×2): qty 2

## 2014-06-16 MED ORDER — ONDANSETRON HCL 4 MG/2ML IJ SOLN
4.0000 mg | Freq: Once | INTRAMUSCULAR | Status: AC
  Administered 2014-06-16: 4 mg via INTRAVENOUS

## 2014-06-16 MED ORDER — DEXAMETHASONE SODIUM PHOSPHATE 4 MG/ML IJ SOLN
INTRAMUSCULAR | Status: AC
  Filled 2014-06-16: qty 1

## 2014-06-16 MED ORDER — MIDAZOLAM HCL 2 MG/2ML IJ SOLN
1.0000 mg | INTRAMUSCULAR | Status: DC | PRN
Start: 2014-06-16 — End: 2014-06-16
  Administered 2014-06-16 (×2): 2 mg via INTRAVENOUS
  Filled 2014-06-16: qty 2

## 2014-06-16 MED ORDER — FENTANYL CITRATE 0.05 MG/ML IJ SOLN
INTRAMUSCULAR | Status: DC | PRN
  Administered 2014-06-16 (×3): 50 ug via INTRAVENOUS
  Administered 2014-06-16: 100 ug via INTRAVENOUS
  Administered 2014-06-16 (×2): 50 ug via INTRAVENOUS

## 2014-06-16 MED ORDER — ONDANSETRON HCL 4 MG/2ML IJ SOLN: 4.0000 mg | Freq: Four times a day (QID) | INTRAMUSCULAR | Status: DC | PRN

## 2014-06-16 MED ORDER — PROPOFOL 10 MG/ML IV BOLUS
INTRAVENOUS | Status: DC | PRN
  Administered 2014-06-16: 150 mg via INTRAVENOUS

## 2014-06-16 MED ORDER — LACTATED RINGERS IV SOLN
INTRAVENOUS | Status: DC
  Administered 2014-06-16 (×4): via INTRAVENOUS

## 2014-06-16 MED ORDER — TAMOXIFEN CITRATE 10 MG PO TABS: 20.0000 mg | ORAL_TABLET | Freq: Every day | ORAL | Status: DC

## 2014-06-16 MED ORDER — DOCUSATE SODIUM 100 MG PO CAPS
100.0000 mg | ORAL_CAPSULE | Freq: Every day | ORAL | Status: DC
  Administered 2014-06-17 – 2014-06-18 (×2): 100 mg via ORAL
  Filled 2014-06-16 (×2): qty 1

## 2014-06-16 MED ORDER — BUPIVACAINE LIPOSOME 1.3 % IJ SUSP
INTRAMUSCULAR | Status: DC | PRN
  Administered 2014-06-16: 28 mL

## 2014-06-16 MED ORDER — SODIUM CHLORIDE 0.9 % IV SOLN
INTRAVENOUS | Status: DC
  Administered 2014-06-16 – 2014-06-17 (×2): via INTRAVENOUS

## 2014-06-16 MED ORDER — VENLAFAXINE HCL 37.5 MG PO TABS
37.5000 mg | ORAL_TABLET | Freq: Two times a day (BID) | ORAL | Status: DC
  Administered 2014-06-17 – 2014-06-18 (×3): 37.5 mg via ORAL
  Filled 2014-06-16 (×3): qty 1

## 2014-06-16 MED ORDER — FENTANYL CITRATE 0.05 MG/ML IJ SOLN
INTRAMUSCULAR | Status: AC
  Filled 2014-06-16: qty 5

## 2014-06-16 MED ORDER — ONDANSETRON HCL 4 MG/2ML IJ SOLN: 4.0000 mg | Freq: Once | INTRAMUSCULAR | Status: DC | PRN

## 2014-06-16 MED ORDER — PROPOFOL 10 MG/ML IV BOLUS
INTRAVENOUS | Status: AC
  Filled 2014-06-16: qty 20

## 2014-06-16 MED ORDER — DIPHENHYDRAMINE HCL 12.5 MG/5ML PO ELIX: 12.5000 mg | ORAL_SOLUTION | Freq: Four times a day (QID) | ORAL | Status: DC | PRN

## 2014-06-16 MED ORDER — IBUPROFEN 600 MG PO TABS: 600.0000 mg | ORAL_TABLET | Freq: Four times a day (QID) | ORAL | Status: DC | PRN

## 2014-06-16 MED ORDER — ROCURONIUM BROMIDE 50 MG/5ML IV SOLN
INTRAVENOUS | Status: AC
  Filled 2014-06-16: qty 1

## 2014-06-16 MED ORDER — MIDAZOLAM HCL 5 MG/5ML IJ SOLN
INTRAMUSCULAR | Status: DC | PRN
  Administered 2014-06-16: 2 mg via INTRAVENOUS

## 2014-06-16 MED ORDER — CEFAZOLIN SODIUM-DEXTROSE 2-3 GM-% IV SOLR
2.0000 g | Freq: Once | INTRAVENOUS | Status: DC
  Filled 2014-06-16 (×2): qty 50

## 2014-06-16 MED ORDER — KETOROLAC TROMETHAMINE 30 MG/ML IJ SOLN
30.0000 mg | Freq: Four times a day (QID) | INTRAMUSCULAR | Status: DC
  Administered 2014-06-16 – 2014-06-18 (×7): 30 mg via INTRAVENOUS
  Filled 2014-06-16 (×7): qty 1

## 2014-06-16 MED ORDER — ROCURONIUM BROMIDE 100 MG/10ML IV SOLN
INTRAVENOUS | Status: DC | PRN
  Administered 2014-06-16 (×2): 10 mg via INTRAVENOUS
  Administered 2014-06-16: 40 mg via INTRAVENOUS
  Administered 2014-06-16 (×2): 10 mg via INTRAVENOUS

## 2014-06-16 MED ORDER — SODIUM CHLORIDE 0.9 % IJ SOLN
INTRAMUSCULAR | Status: AC
  Filled 2014-06-16: qty 20

## 2014-06-16 MED ORDER — LIDOCAINE HCL (PF) 1 % IJ SOLN
INTRAMUSCULAR | Status: AC
  Filled 2014-06-16: qty 5

## 2014-06-16 MED ORDER — GLYCOPYRROLATE 0.2 MG/ML IJ SOLN
INTRAMUSCULAR | Status: DC | PRN
  Administered 2014-06-16: 0.4 mg via INTRAVENOUS

## 2014-06-16 MED ORDER — PANTOPRAZOLE SODIUM 40 MG PO TBEC
40.0000 mg | DELAYED_RELEASE_TABLET | Freq: Every day | ORAL | Status: DC
  Administered 2014-06-17 – 2014-06-18 (×2): 40 mg via ORAL
  Filled 2014-06-16 (×2): qty 1

## 2014-06-16 MED ORDER — DEXAMETHASONE SODIUM PHOSPHATE 4 MG/ML IJ SOLN
4.0000 mg | Freq: Once | INTRAMUSCULAR | Status: AC
  Administered 2014-06-16: 4 mg via INTRAVENOUS

## 2014-06-16 MED ORDER — FENTANYL CITRATE 0.05 MG/ML IJ SOLN: 25.0000 ug | INTRAMUSCULAR | Status: DC | PRN

## 2014-06-16 MED ORDER — GLYCOPYRROLATE 0.2 MG/ML IJ SOLN
INTRAMUSCULAR | Status: AC
  Filled 2014-06-16: qty 2

## 2014-06-16 MED ORDER — BUPIVACAINE LIPOSOME 1.3 % IJ SUSP
INTRAMUSCULAR | Status: AC
  Filled 2014-06-16: qty 20

## 2014-06-16 SURGICAL SUPPLY — 73 items
APPLIER CLIP 11 MED OPEN (CLIP)
APPLIER CLIP 13 LRG OPEN (CLIP)
BAG HAMPER (MISCELLANEOUS) ×3 IMPLANT
BENZOIN TINCTURE PRP APPL 2/3 (GAUZE/BANDAGES/DRESSINGS) ×3 IMPLANT
CELLS DAT CNTRL 66122 CELL SVR (MISCELLANEOUS) ×2 IMPLANT
CLIP APPLIE 11 MED OPEN (CLIP) IMPLANT
CLIP APPLIE 13 LRG OPEN (CLIP) IMPLANT
CLOTH BEACON ORANGE TIMEOUT ST (SAFETY) ×3 IMPLANT
COVER LIGHT HANDLE STERIS (MISCELLANEOUS) ×6 IMPLANT
DRAPE WARM FLUID 44X44 (DRAPE) IMPLANT
DRESSING TELFA 8X3 (GAUZE/BANDAGES/DRESSINGS) IMPLANT
DRSG OPSITE POSTOP 4X10 (GAUZE/BANDAGES/DRESSINGS) ×3 IMPLANT
DRSG OPSITE POSTOP 4X8 (GAUZE/BANDAGES/DRESSINGS) ×3 IMPLANT
DURAPREP 26ML APPLICATOR (WOUND CARE) ×3 IMPLANT
ELECT REM PT RETURN 9FT ADLT (ELECTROSURGICAL) ×3
ELECTRODE REM PT RTRN 9FT ADLT (ELECTROSURGICAL) ×2 IMPLANT
EVACUATOR DRAINAGE 10X20 100CC (DRAIN) IMPLANT
EVACUATOR SILICONE 100CC (DRAIN)
FORMALIN 10 PREFIL 480ML (MISCELLANEOUS) ×3 IMPLANT
GAUZE PACKING 2X5 YD STRL (GAUZE/BANDAGES/DRESSINGS) IMPLANT
GAUZE SPONGE 4X4 12PLY STRL (GAUZE/BANDAGES/DRESSINGS) IMPLANT
GLOVE BIO SURGEON STRL SZ 6.5 (GLOVE) ×6 IMPLANT
GLOVE BIOGEL PI IND STRL 7.0 (GLOVE) ×6 IMPLANT
GLOVE BIOGEL PI IND STRL 7.5 (GLOVE) ×2 IMPLANT
GLOVE BIOGEL PI IND STRL 9 (GLOVE) ×2 IMPLANT
GLOVE BIOGEL PI INDICATOR 7.0 (GLOVE) ×3
GLOVE BIOGEL PI INDICATOR 7.5 (GLOVE) ×1
GLOVE BIOGEL PI INDICATOR 9 (GLOVE) ×1
GLOVE ECLIPSE 6.5 STRL STRAW (GLOVE) ×3 IMPLANT
GLOVE ECLIPSE 9.0 STRL (GLOVE) ×3 IMPLANT
GOWN SPEC L3 XXLG W/TWL (GOWN DISPOSABLE) ×3 IMPLANT
GOWN STRL REUS W/TWL LRG LVL3 (GOWN DISPOSABLE) ×9 IMPLANT
INST SET MAJOR GENERAL (KITS) ×3 IMPLANT
KIT ROOM TURNOVER APOR (KITS) ×3 IMPLANT
MANIFOLD NEPTUNE II (INSTRUMENTS) ×3 IMPLANT
NEEDLE HYPO 18GX1.5 BLUNT FILL (NEEDLE) ×6 IMPLANT
NEEDLE HYPO 25X1 1.5 SAFETY (NEEDLE) ×3 IMPLANT
NS IRRIG 1000ML POUR BTL (IV SOLUTION) ×6 IMPLANT
PACK ABDOMINAL MAJOR (CUSTOM PROCEDURE TRAY) ×3 IMPLANT
PAD ABD 5X9 TENDERSORB (GAUZE/BANDAGES/DRESSINGS) ×6 IMPLANT
PAD ARMBOARD 7.5X6 YLW CONV (MISCELLANEOUS) ×3 IMPLANT
RETRACTOR WND ALEXIS 25 LRG (MISCELLANEOUS) IMPLANT
RTRCTR WOUND ALEXIS 18CM MED (MISCELLANEOUS) ×3
RTRCTR WOUND ALEXIS 25CM LRG (MISCELLANEOUS)
SET BASIN LINEN APH (SET/KITS/TRAYS/PACK) ×3 IMPLANT
SOL PREP PROV IODINE SCRUB 4OZ (MISCELLANEOUS) IMPLANT
SPONGE DRAIN TRACH 4X4 STRL 2S (GAUZE/BANDAGES/DRESSINGS) IMPLANT
SPONGE GAUZE 4X4 12PLY (GAUZE/BANDAGES/DRESSINGS) ×3 IMPLANT
SPONGE LAP 18X18 X RAY DECT (DISPOSABLE) ×3 IMPLANT
STAPLER VISISTAT 35W (STAPLE) IMPLANT
STRIP CLOSURE SKIN 1/2X4 (GAUZE/BANDAGES/DRESSINGS) ×6 IMPLANT
SUT CHROMIC 0 CT 1 (SUTURE) ×51 IMPLANT
SUT CHROMIC 2 0 CT 1 (SUTURE) ×9 IMPLANT
SUT CHROMIC GUT BROWN 0 54 (SUTURE) IMPLANT
SUT CHROMIC GUT BROWN 0 54IN (SUTURE)
SUT ETHILON 3 0 FSL (SUTURE) IMPLANT
SUT MON AB 3-0 SH 27 (SUTURE) IMPLANT
SUT PDS AB CT VIOLET #0 27IN (SUTURE) IMPLANT
SUT PLAIN CT 1/2CIR 2-0 27IN (SUTURE) ×6 IMPLANT
SUT PROLENE 0 CT 1 30 (SUTURE) IMPLANT
SUT VIC AB 0 CT1 27 (SUTURE) ×1
SUT VIC AB 0 CT1 27XBRD ANTBC (SUTURE) ×2 IMPLANT
SUT VIC AB 0 CTX 36 (SUTURE) ×1
SUT VIC AB 0 CTX36XBRD ANTBCTR (SUTURE) ×2 IMPLANT
SUT VIC AB 2-0 CT1 27 (SUTURE)
SUT VIC AB 2-0 CT1 TAPERPNT 27 (SUTURE) IMPLANT
SUT VICRYL 3 0 (SUTURE) IMPLANT
SUT VICRYL 4 0 KS 27 (SUTURE) ×6 IMPLANT
SYR 20CC LL (SYRINGE) ×3 IMPLANT
TAPE CLOTH SURG 4X10 WHT LF (GAUZE/BANDAGES/DRESSINGS) ×3 IMPLANT
TOWEL BLUE STERILE X RAY DET (MISCELLANEOUS) ×3 IMPLANT
TOWEL OR 17X26 4PK STRL BLUE (TOWEL DISPOSABLE) IMPLANT
TRAY FOLEY CATH 16FR SILVER (SET/KITS/TRAYS/PACK) ×3 IMPLANT

## 2014-06-16 NOTE — Interval H&P Note (Signed)
History and Physical Interval Note:  06/16/2014 7:35 AM  [name]  has presented today for surgery, with the diagnosis of personal history breast cancer   The various methods of treatment have been discussed with the patient and family. After consideration of risks, benefits and other options for treatment, the patient has consented to  Procedure(s): HYSTERECTOMY ABDOMINAL (N/A) BILATERAL SALPINGO OOPHORECTOMY (Bilateral) SCAR REVISION (N/A) as a surgical intervention .  The patient's history has been reviewed, patient examined, no change in status, stable for surgery.  I have reviewed the patient's chart and labs.  Questions were answered to the patient's satisfaction.     Vanessa Neal

## 2014-06-16 NOTE — Anesthesia Postprocedure Evaluation (Signed)
  Anesthesia Post-op Note  Patient: Vanessa Neal  Procedure(s) Performed: Procedure(s): HYSTERECTOMY ABDOMINAL (N/A) BILATERAL SALPINGO OOPHORECTOMY (Bilateral) SCAR REVISION (N/A)  Patient Location: PACU  Anesthesia Type:General  Level of Consciousness: awake, alert , oriented and patient cooperative  Airway and Oxygen Therapy: Patient Spontanous Breathing and Patient connected to nasal cannula oxygen  Post-op Pain: 3 /10, mild  Post-op Assessment: Post-op Vital signs reviewed, Patient's Cardiovascular Status Stable, Respiratory Function Stable, Patent Airway, No signs of Nausea or vomiting and Pain level controlled  Post-op Vital Signs: Reviewed and stable  Last Vitals:  Filed Vitals:   06/16/14 1020  BP:   Temp: 03.4 C    Complications: No apparent anesthesia complications

## 2014-06-16 NOTE — Brief Op Note (Signed)
06/16/2014  10:03 AM  PATIENT:  Vanessa Neal  54 y.o. female  PRE-OPERATIVE DIAGNOSIS:  personal history breast cancer , for cancer risk reduction, treatment requirement  POST-OPERATIVE DIAGNOSIS:  personal history breast cancer   PROCEDURE:  Procedure(s): HYSTERECTOMY ABDOMINAL (N/A) BILATERAL SALPINGO OOPHORECTOMY (Bilateral) SCAR REVISION (N/A)  SURGEON:  Surgeon(s) and Role:    * Jonnie Kind, MD - Primary  PHYSICIAN ASSISTANT:   ASSISTANTS: none   ANESTHESIA:   local and general  EBL:  Total I/O In: 2800 [I.V.:2800] Out: 350 [Urine:50; Blood:300]  BLOOD ADMINISTERED:none  DRAINS: Urinary Catheter (Foley)   LOCAL MEDICATIONS USED:  OTHER Exparel 40 cc  SPECIMEN:  Source of Specimen:  uterus cervix tubes ovaries  DISPOSITION OF SPECIMEN:  PATHOLOGY  COUNTS:  YES  TOURNIQUET:  * No tourniquets in log *  DICTATION: .Dragon Dictation  PLAN OF CARE: Admit to inpatient   PATIENT DISPOSITION:  PACU - hemodynamically stable.   Delay start of Pharmacological VTE agent (>24hrs) due to surgical blood loss or risk of bleeding: not applicable

## 2014-06-16 NOTE — Anesthesia Preprocedure Evaluation (Signed)
Anesthesia Evaluation  Patient identified by MRN, date of birth, ID band Patient awake    Reviewed: Allergy & Precautions, NPO status , Patient's Chart, lab work & pertinent test results  Airway Mallampati: II  TM Distance: <3 FB Neck ROM: Full    Dental  (+) Teeth Intact, Dental Advisory Given   Pulmonary neg pulmonary ROS,  breath sounds clear to auscultation        Cardiovascular negative cardio ROS  Rhythm:Regular Rate:Normal     Neuro/Psych    GI/Hepatic negative GI ROS,   Endo/Other    Renal/GU      Musculoskeletal   Abdominal   Peds  Hematology   Anesthesia Other Findings   Reproductive/Obstetrics                             Anesthesia Physical Anesthesia Plan  ASA: II  Anesthesia Plan: General   Post-op Pain Management:    Induction: Intravenous  Airway Management Planned: Oral ETT  Additional Equipment:   Intra-op Plan:   Post-operative Plan: Extubation in OR  Informed Consent: I have reviewed the patients History and Physical, chart, labs and discussed the procedure including the risks, benefits and alternatives for the proposed anesthesia with the patient or authorized representative who has indicated his/her understanding and acceptance.     Plan Discussed with:   Anesthesia Plan Comments:         Anesthesia Quick Evaluation

## 2014-06-16 NOTE — Interval H&P Note (Signed)
History and Physical Interval Note:  06/16/2014 7:34 AM  [name]  has presented today for surgery, with the diagnosis of personal history breast cancer   The various methods of treatment have been discussed with the patient and family. After consideration of risks, benefits and other options for treatment, the patient has consented to  Procedure(s): HYSTERECTOMY ABDOMINAL (N/A) BILATERAL SALPINGO OOPHORECTOMY (Bilateral) SCAR REVISION (N/A) as a surgical intervention .  The patient's history has been reviewed, patient examined, no change in status, stable for surgery.  I have reviewed the patient's chart and labs.  Questions were answered to the patient's satisfaction.     Vanessa Neal

## 2014-06-16 NOTE — Progress Notes (Signed)
Patient tolerating clear liquid diet. Patient has requested soft diet, will advance diet as ordered.

## 2014-06-16 NOTE — Anesthesia Procedure Notes (Signed)
Procedure Name: Intubation Date/Time: 06/16/2014 7:48 AM Performed by: Charmaine Downs Pre-anesthesia Checklist: Patient being monitored, Suction available, Emergency Drugs available and Patient identified Patient Re-evaluated:Patient Re-evaluated prior to inductionOxygen Delivery Method: Circle system utilized Preoxygenation: Pre-oxygenation with 100% oxygen Intubation Type: IV induction and Cricoid Pressure applied Ventilation: Mask ventilation without difficulty and Oral airway inserted - appropriate to patient size Laryngoscope Size: Mac and 3 Grade View: Grade II Tube type: Oral Tube size: 7.0 mm Number of attempts: 1 Airway Equipment and Method: Stylet and Oral airway Placement Confirmation: breath sounds checked- equal and bilateral,  ETT inserted through vocal cords under direct vision and positive ETCO2 Secured at: 22 cm Tube secured with: Tape Dental Injury: Teeth and Oropharynx as per pre-operative assessment

## 2014-06-16 NOTE — Transfer of Care (Signed)
Immediate Anesthesia Transfer of Care Note  Patient: Vanessa Neal  Procedure(s) Performed: Procedure(s): HYSTERECTOMY ABDOMINAL (N/A) BILATERAL SALPINGO OOPHORECTOMY (Bilateral) SCAR REVISION (N/A)  Patient Location: PACU  Anesthesia Type:General  Level of Consciousness: awake and patient cooperative  Airway & Oxygen Therapy: Patient Spontanous Breathing and Patient connected to face mask oxygen  Post-op Assessment: Report given to PACU RN, Post -op Vital signs reviewed and stable and Patient moving all extremities  Post vital signs: Reviewed and stable  Complications: No apparent anesthesia complications

## 2014-06-16 NOTE — Op Note (Signed)
06/16/2014  10:03 AM  PATIENT:  Vanessa Neal  54 y.o. female  PRE-OPERATIVE DIAGNOSIS:  personal history breast cancer , for cancer risk reduction, treatment requirement  POST-OPERATIVE DIAGNOSIS:  personal history breast cancer   PROCEDURE:  Procedure(s): HYSTERECTOMY ABDOMINAL (N/A) BILATERAL SALPINGO OOPHORECTOMY (Bilateral) SCAR REVISION (N/A)  SURGEON:  Surgeon(s) and Role:    * Jonnie Kind, MD - Primary  PHYSICIAN ASSISTANT:   ASSISTANTS: none   ANESTHESIA:   local and general  EBL:  Total I/O In: 2800 [I.V.:2800] Out: 350 [Urine:50; Blood:300]  BLOOD ADMINISTERED:none  DRAINS: Urinary Catheter (Foley)   LOCAL MEDICATIONS USED:  OTHER Exparel 40 cc  SPECIMEN:  Source of Specimen:  uterus cervix tubes ovaries  DISPOSITION OF SPECIMEN:  PATHOLOGY  COUNTS:  YES  TOURNIQUET:  * No tourniquets in log *  DICTATION: .Dragon Dictation  PLAN OF CARE: Admit to inpatient   PATIENT DISPOSITION:  PACU - hemodynamically stable.   Delay start of Pharmacological VTE agent (>24hrs) due to surgical blood loss or risk of bleeding: not applicable Details of procedure: Patient was taken to the operating room, prepped and draped for abdominal procedure after Foley catheter insertion, vaginal prepping, antibiotic administration, with timeout conducted and procedure confirmed by surgical team as abdominal hysterectomy bilateral salpingo-oophorectomy with revision of the old cicatrix. Transverse lower bone incision was repeated in excising a 20 cm long strip of skin involving the old scar through the skin and fatty tissue. The remaining fatty tissue was dissected down to the fascia which was opened transversely , and the peritoneal cavity opened in the midline the bowel was packed away with Alexis wound retractor, moist laparotomy tapes, and attention to the pelvis noted normal appearing ovaries and fallopian tubes with evidence of prior sterilization, and a small mobile uterus  evidence of prior cesarean section was noted. There were adhesions to the left side of the sigmoid colon to the left fallopian tube which were sharply dissected. Free. Round ligament was taken down the left, the left IP ligament isolated, staying well away from the pelvic sidewall, with clamping cutting and suture ligating of the IP ligament immediately adjacent to the ovary the uterine vessels were isolated, the bladder flap having been developed anteriorly, then the uterine vessels on the left side were clamped cut and suture ligated with 0 chromic. Upper cardinal ligaments were clamped cut and suture ligated using straight Heaney clamp, knife dissection and 0 chromic suture the opposite side was treated similarly with palpation of the ureter in the retroperitoneum confirming a safe position prior to lift the right IP ligament.at this point the uterine body was amputated off the lower uterine segment to allow for improved access to the pelvis, due to relatively fibrotic and inflexible fascia. Coker clamps were applied to the cervical stump, and the lower cardinal ligaments clamped cut and suture ligated on both sides. Stab incision was then made in the anterior cervicovaginal fornix, leaving some of the paracervical ring a fibrotic tissue in place and amputating the cervix off of the cuff this tissue was quite fibrous, but was able to be accomplished. Aldridge stitch was placed at each lateral vaginal angle. The right side required oversewing x2 sutures to achieve adequate hemostasis. Care was taken to insure that the bladder was well out of the area of the surgical closure and sutures were placed from front to back. Remaining portion of the cuff was closed using front to back interrupted sutures of 0 chromic hemostasis was considered adequate. Veress majority  of the bleeding was associated with cervix removal and cuff closure. Pedicles were inspected and hemostasis confirmed. We observed the cuff for a few  minutes and then closed the peritoneum over it pelvis was irrigated, laparotomy equipment removed, anterior peritoneum closed with 2-0 chromic, fascia closed with 0 Vicryl. Exparel was injected in the fascia and subcutaneous fatty tissues, then the subcutaneous tissues reapproximated with interrupted 20 plain and then subcuticular 4-0 Vicryl close the skin incision sponge and needle counts were correct patient went to recovery in good condition.sponge and needle counts correct EBL 300 cc

## 2014-06-17 LAB — BASIC METABOLIC PANEL
ANION GAP: 5 (ref 5–15)
BUN: 10 mg/dL (ref 6–23)
CALCIUM: 8 mg/dL — AB (ref 8.4–10.5)
CO2: 26 mmol/L (ref 19–32)
Chloride: 107 mEq/L (ref 96–112)
Creatinine, Ser: 0.7 mg/dL (ref 0.50–1.10)
GFR calc non Af Amer: >90 mL/min (ref >90)
Glucose, Bld: 125 mg/dL — ABNORMAL HIGH (ref 70–99)
Potassium: 3.7 mmol/L (ref 3.5–5.1)
SODIUM: 138 mmol/L (ref 135–145)

## 2014-06-17 LAB — CBC
HCT: 31.4 % — ABNORMAL LOW (ref 36.0–46.0)
Hemoglobin: 10.6 g/dL — ABNORMAL LOW (ref 12.0–15.0)
MCH: 34.8 pg — AB (ref 26.0–34.0)
MCHC: 33.8 g/dL (ref 30.0–36.0)
MCV: 103 fL — AB (ref 78.0–100.0)
PLATELETS: 181 10*3/uL (ref 150–400)
RBC: 3.05 MIL/uL — ABNORMAL LOW (ref 3.87–5.11)
RDW: 13.1 % (ref 11.5–15.5)
WBC: 9 10*3/uL (ref 4.0–10.5)

## 2014-06-17 NOTE — Progress Notes (Signed)
Nurse removed foley catheter per order. Pt tolerated well.  Pt up to bathroom with not much difficulty. Voided small amount. Pt doing very well this am. Pt says she's not in much pain right now. Pt stated, "i'm ready to get back to my normal self". Husband has remained at bedside. Will continue to monitor. Bed is in lowest position and call bell is within reach.

## 2014-06-17 NOTE — Progress Notes (Signed)
1 Day Post-Op Procedure(s) (LRB): HYSTERECTOMY ABDOMINAL (N/A) BILATERAL SALPINGO OOPHORECTOMY (Bilateral) SCAR REVISION (N/A)  Subjective: Patient reports tolerating PO and no problems voiding.  Pain moderate, used PCA x 1 today  Objective: I have reviewed patient's vital signs and medications.  General: alert, cooperative and mild distress GI: soft, non-tender; bowel sounds normal; no masses,  no organomegaly and incision: clean, dry, intact and dressing in place CBC    Component Value Date/Time   WBC 9.0 06/17/2014 0534   RBC 3.05* 06/17/2014 0534   HGB 10.6* 06/17/2014 0534   HCT 31.4* 06/17/2014 0534   PLT 181 06/17/2014 0534   MCV 103.0* 06/17/2014 0534   MCH 34.8* 06/17/2014 0534   MCHC 33.8 06/17/2014 0534   RDW 13.1 06/17/2014 0534   LYMPHSABS 2.8 08/15/2013 0946   MONOABS 0.4 08/15/2013 0946   EOSABS 0.1 08/15/2013 0946   BASOSABS 0.0 08/15/2013 0946    Assessment: s/p Procedure(s): HYSTERECTOMY ABDOMINAL (N/A) BILATERAL SALPINGO OOPHORECTOMY (Bilateral) SCAR REVISION (N/A): stable  Plan: Advance diet reduce IV, keep PCA.  LOS: 1 day    Ishani Goldwasser V 06/17/2014, 12:10 PM

## 2014-06-17 NOTE — Addendum Note (Signed)
Addendum  created 06/17/14 1042 by Ollen Bowl, CRNA   Modules edited: Notes Section   Notes Section:  File: [id number]

## 2014-06-17 NOTE — Anesthesia Postprocedure Evaluation (Signed)
  Anesthesia Post-op Note  Patient: Vanessa Neal  Procedure(s) Performed: Procedure(s): HYSTERECTOMY ABDOMINAL (N/A) BILATERAL SALPINGO OOPHORECTOMY (Bilateral) SCAR REVISION (N/A)  Patient Location: Nursing Unit  Anesthesia Type:General  Level of Consciousness: awake, alert  and oriented  Airway and Oxygen Therapy: Patient Spontanous Breathing  Post-op Pain: none  Post-op Assessment: Post-op Vital signs reviewed, Patient's Cardiovascular Status Stable, Respiratory Function Stable, Patent Airway and No signs of Nausea or vomiting  Post-op Vital Signs: Reviewed and stable  Last Vitals:  Filed Vitals:   06/17/14 0906  BP:   Pulse:   Temp:   Resp: 18    Complications: No apparent anesthesia complications

## 2014-06-17 NOTE — Care Management Note (Addendum)
    Page 1 of 1   06/18/2014     9:24:37 AM CARE MANAGEMENT NOTE 06/18/2014  Patient:  Vanessa Neal, Vanessa Neal   Account Number:  [id number]  Date Initiated:  06/17/2014  Documentation initiated by:  Theophilus Kinds  Subjective/Objective Assessment:   Pt admitted from home s/p abd hysterectomy. Pt lives with her husband and will return home at discharge. Pt is independent with ADl's.     Action/Plan:   Anticipate discharge within 24 hours. No CM needs noted.   Anticipated DC Date:  06/18/2014   Anticipated DC Plan:  [place]  CM consult      Choice offered to / List presented to:             Status of service:  Completed, signed off Medicare Important Message given?   (If response is "NO", the following Medicare IM given date fields will be blank) Date Medicare IM given:   Medicare IM given by:   Date Additional Medicare IM given:   Additional Medicare IM given by:    Discharge Disposition:  HOME/SELF CARE  Per UR Regulation:    If discussed at Long Length of Stay Meetings, dates discussed:    Comments:  06/18/14 0915 Christinia Gully, RN BSN CM Pt discharged home today. No CM needs noted.  06/17/14 [place], RN BSN CM

## 2014-06-17 NOTE — Progress Notes (Signed)
UR chart review completed.  

## 2014-06-18 ENCOUNTER — Encounter (HOSPITAL_COMMUNITY): Payer: Self-pay | Admitting: Obstetrics and Gynecology

## 2014-06-18 MED ORDER — IBUPROFEN 600 MG PO TABS: 600.0000 mg | ORAL_TABLET | Freq: Four times a day (QID) | ORAL | Status: DC | PRN

## 2014-06-18 MED ORDER — OXYCODONE-ACETAMINOPHEN 5-325 MG PO TABS: 1.0000 | ORAL_TABLET | ORAL | Status: DC | PRN

## 2014-06-18 MED ORDER — DOCUSATE SODIUM 100 MG PO CAPS: 100.0000 mg | ORAL_CAPSULE | Freq: Two times a day (BID) | ORAL | Status: DC | PRN

## 2014-06-18 NOTE — Progress Notes (Signed)
2 Days Post-Op Procedure(s) (LRB): HYSTERECTOMY ABDOMINAL (N/A) BILATERAL SALPINGO OOPHORECTOMY (Bilateral) SCAR REVISION (N/A)  Subjective: Patient reports incisional pain, tolerating PO, + flatus and no problems voiding.  Pain control good , used PCA thru nite.  Objective: I have reviewed patient's vital signs, intake and output, medications, labs and pathology. Path still pending  General: alert, cooperative and no distress Resp: unlabored breathing GI: soft, non-tender; bowel sounds normal; no masses,  no organomegaly and incision: clean, dry and intact  Assessment: s/p Procedure(s): HYSTERECTOMY ABDOMINAL (N/A) BILATERAL SALPINGO OOPHORECTOMY (Bilateral) SCAR REVISION (N/A): stable and progressing well  Plan: Discontinue IV fluids Discharge home  LOS: 2 days    Cataleya Cristina V 06/18/2014, 8:35 AM

## 2014-06-18 NOTE — Progress Notes (Signed)
Discharge instruction reviewed with patient no distress noted. Honeycomb dressing intact to lower abdomen. Patient taken to lobby via wheelchair.

## 2014-06-18 NOTE — Discharge Instructions (Signed)
Abdominal Hysterectomy, Care After Refer to this sheet in the next few weeks. These instructions provide you with information on caring for yourself after your procedure. Your health care provider may also give you more specific instructions. Your treatment has been planned according to current medical practices, but problems sometimes occur. Call your health care provider if you have any problems or questions after your procedure.  WHAT TO EXPECT AFTER THE PROCEDURE After your procedure, it is typical to have the following:  Pain.  Feeling tired.  Poor appetite.  Less interest in sex. HOME CARE INSTRUCTIONS  It takes 4-6 weeks to recover from this surgery. Make sure you follow all your health care provider's instructions. Home care instructions may include:  Take pain medicines only as directed by your health care provider. Do not take over-the-counter pain medicines without checking with your health care provider first.  Change your bandage as directed by your health care provider.  Return to your health care provider to have your sutures taken out.  Take showers instead of baths for 2-3 weeks. Ask your health care provider when it is safe to start showering.  Do not douche, use tampons, or have sexual intercourse for at least 6 weeks or until your health care provider says you can.   Follow your health care provider's advice about exercise, lifting, driving, and general activities.  Get plenty of rest and sleep.   Do not lift anything heavier than a gallon of milk (about 10 lb [4.5 kg]) for the first month after surgery.  You can resume your normal diet if your health care provider says it is okay.   Do not drink alcohol until your health care provider says you can.   If you are constipated, ask your health care provider if you can take a mild laxative.  Eating foods high in fiber may also help with constipation. Eat plenty of raw fruits and vegetables, whole grains, and  beans.  Drink enough fluids to keep your urine clear or pale yellow.   Try to have someone at home with you for the first 1-2 weeks to help around the house.  Keep all follow-up appointments. SEEK MEDICAL CARE IF:   You have chills or fever.  You have swelling, redness, or pain in the area of your incision that is getting worse.   You have pus coming from the incision.   You notice a bad smell coming from the incision or bandage.   Your incision breaks open.   You feel dizzy or light-headed.   You have pain or bleeding when you urinate.   You have persistent diarrhea.   You have persistent nausea and vomiting.   You have abnormal vaginal discharge.   You have a rash.   You have any type of abnormal reaction or develop an allergy to your medicine.   Your pain medicine is not helping.  SEEK IMMEDIATE MEDICAL CARE IF:   You have a fever and your symptoms suddenly get worse.  You have severe abdominal pain.  You have chest pain.  You have shortness of breath.  You faint.  You have pain, swelling, or redness of your leg.  You have heavy vaginal bleeding with blood clots. MAKE SURE YOU:  Understand these instructions.  Will watch your condition.  Will get help right away if you are not doing well or get worse. Document Released: 12/02/2004 Document Revised: 05/20/2013 Document Reviewed: 03/07/2013 Exit[employer] Patient Information 2015 [employer]. This information is not intended   to replace advice given to you by your health care provider. Make sure you discuss any questions you have with your health care provider.  

## 2014-06-18 NOTE — Discharge Summary (Signed)
Physician Discharge Summary  Patient ID: Vanessa Neal MRN: [id number] DOB/AGE: November 11, 1960 54 y.o.  Admit date: 06/16/2014 Discharge date: 06/18/2014  Admission Diagnoses: infiltrating ductal carcinoma of breast,                           Hysterectomy indicated for treatment change  Discharge Diagnoses: same  Active Problems:   S/P total hysterectomy and bilateral salpingo-oophorectomy   Discharged Condition: good  Hospital Course: Expand All Collapse All   Vanessa Neal is an 54 y.o. female. She is being admitted for Abdominal hysterectomy, with bilateral salpingoophorectomy, With revision of her old cesarean section scar during access to the pelvis. Vanessa Neal is currently premenopausal, and is being considered for treatment for her recent breast cancer with an aromatase inhibitor, for which she is not eligible until postmenopausal by natural or surgical means, so she is referred to our office due to desire to start this Aromatase inhibitor tx ASAP.  In disucssion of surgical options, the patient desires full hysterectomy, total abdominal hysterectomy, bilateral salpingo- oophorectomy, with scar revision, from her prior cesarean sections. Initial discussions were made As to whether to remove the cervix, but after discussion with providers and her family , she desires removal of her cervix as well as the uterus and ovaries. Efforts to perform endometrial biopsy were unsuccessful due to Cervical stenosis, and pelvic u/s showed a thin endometrium at 7 mm, considered normal for premenopausal status, and this case was discussed with Dr Denman George, Gyn Oncologist,, as to whether proceeding directly to hysterectomy with these findings was reasonable, and she concurred. Pap is normal.     Consults: None  Significant Diagnostic Studies: labs:  CBC Latest Ref Rng 06/17/2014 06/11/2014 04/29/2014  WBC 4.0 - 10.5 K/uL 9.0 5.8 6.0  Hemoglobin 12.0 - 15.0 g/dL 10.6(L) 13.2 12.7  Hematocrit 36.0 -  46.0 % 31.4(L) 38.1 36.5  Platelets 150 - 400 K/uL 181 219 241      Treatments: surgery: total abdominal hysterectomy, bilateral salpingo-oophorectomy, scar revision   Discharge Exam:Subjective: Patient reports incisional pain, tolerating PO, + flatus and no problems voiding. Pain control good , used PCA thru nite.  Objective: I have reviewed patient's vital signs, intake and output, medications, labs and pathology. Path still pending  General: alert, cooperative and no distress Resp: unlabored breathing GI: soft, non-tender; bowel sounds normal; no masses, no organomegaly and incision: clean, dry and intact    Disposition: 01-Home or Self Care  Discharge Instructions    Call MD for:  persistant nausea and vomiting    Complete by:  As directed      Call MD for:  severe uncontrolled pain    Complete by:  As directed      Call MD for:  temperature >100.4    Complete by:  As directed      Diet - low sodium heart healthy    Complete by:  As directed      Increase activity slowly    Complete by:  As directed             Medication List    TAKE these medications        calcium-vitamin D 500-200 MG-UNIT per tablet  Commonly known as:  OSCAL WITH D  Take 1 tablet by mouth 2 (two) times daily.     docusate sodium 100 MG capsule  Commonly known as:  COLACE  Take 100 mg by mouth daily.  docusate sodium 100 MG capsule  Commonly known as:  COLACE  Take 1 capsule (100 mg total) by mouth 2 (two) times daily as needed.     ibuprofen 600 MG tablet  Commonly known as:  ADVIL,MOTRIN  Take 1 tablet (600 mg total) by mouth every 6 (six) hours as needed (mild pain).     ONE-A-DAY WOMENS PO  Take by mouth daily.     oxyCODONE-acetaminophen 5-325 MG per tablet  Commonly known as:  PERCOCET/ROXICET  Take 1-2 tablets by mouth every 4 (four) hours as needed for severe pain (moderate to severe pain (when tolerating fluids)).     tamoxifen 20 MG tablet  Commonly known as:   NOLVADEX  Take 1 tablet (20 mg total) by mouth daily.     venlafaxine 37.5 MG tablet  Commonly known as:  EFFEXOR  Take 1 tablet (37.5 mg total) by mouth 2 (two) times daily.           Follow-up Information    Follow up with Jonnie Kind, MD In 2 weeks.   Specialties:  Obstetrics and Gynecology, Radiology   Why:  For wound re-check   Contact information:   [place] [address] [phone]       Signed: Jonnie Kind 06/18/2014, 8:43 AM

## 2014-06-21 LAB — TYPE AND SCREEN
ABO/RH(D): A POS
Antibody Screen: NEGATIVE

## 2014-06-23 ENCOUNTER — Encounter: Payer: BC Managed Care – PPO | Admitting: Obstetrics and Gynecology

## 2014-07-06 ENCOUNTER — Encounter: Payer: Self-pay | Admitting: Obstetrics and Gynecology

## 2014-07-06 ENCOUNTER — Ambulatory Visit (INDEPENDENT_AMBULATORY_CARE_PROVIDER_SITE_OTHER): Payer: BLUE CROSS/BLUE SHIELD | Admitting: Obstetrics and Gynecology

## 2014-07-06 VITALS — BP 140/90 | Ht 64.0 in | Wt 188.5 lb

## 2014-07-06 DIAGNOSIS — Z09 Encounter for follow-up examination after completed treatment for conditions other than malignant neoplasm: Secondary | ICD-10-CM

## 2014-07-06 NOTE — Progress Notes (Signed)
Patient ID: Vanessa Neal, female   DOB: 1960/09/26, 54 y.o.   MRN: [id number] Pt here today for post op. Pt denies any problems or concerns at this time.

## 2014-07-06 NOTE — Progress Notes (Signed)
Patient ID: Vanessa Neal, female   DOB: 1961-04-24, 54 y.o.   MRN: [id number] The office visit for this patient today is documented in the note by Juventino Slovak, and is reviewed for completeness.  I personally performed the services described in this documentation, which was scribed in my presence. The recorded information has been reviewed and is accurate.

## 2014-07-06 NOTE — Progress Notes (Addendum)
Patient ID: Vanessa Neal, female   DOB: 02/01/1961, 54 y.o.   MRN: [id number]   [facility] Visit  Patient name: Vanessa Neal MRN [id number]  Date of birth: Nov 23, 1960  CC & HPI:  Vanessa Neal is a 54 y.o. female presenting today for 1 month follow-up of abdominal hysterectomy and bilateral salpingo oophorectomy that occurred on 1/19. Pt denies complaints or pain at this time. She still has tape over incision wound.    ROS:  A complete 10 system review of systems was obtained and all systems are negative except as noted in the HPI and PMH.   Pertinent History Reviewed:   Reviewed: Significant for ovarian cyst, cesarean section, abdominal hysterectomy and bilateral salpingo oophorectomy Medical         Past Medical History  Diagnosis Date   Breast cancer    Ovarian cyst    Infiltrating ductal carcinoma of right female breast 01/04/2011   BRCA negative 02/14/2013   Right thyroid nodule    Fecal occult blood test positive 04/29/2014                              Surgical Hx:    Past Surgical History  Procedure Laterality Date   Cholecystectomy     Cesarean section      X 2   Lipoma excision      from back   Colonoscopy  03/11/2012    Procedure: COLONOSCOPY;  Surgeon: West Bali, MD;  Location: [facility];  Service: Endoscopy;  Laterality: N/A;  9:30 AM-changed to 9:50 Doris notified   Breast lumpectomy      right   Breast cyst excision     Breast cyst aspiration  01/2012   Mastectomy Right    Abdominal hysterectomy N/A 06/16/2014    Procedure: HYSTERECTOMY ABDOMINAL;  Surgeon: Tilda Burrow, MD;  Location: [facility];  Service: Gynecology;  Laterality: N/A;   Salpingoophorectomy Bilateral 06/16/2014    Procedure: BILATERAL SALPINGO OOPHORECTOMY;  Surgeon: Tilda Burrow, MD;  Location: [facility];  Service: Gynecology;  Laterality: Bilateral;   Scar revision N/A 06/16/2014    Procedure: SCAR REVISION;  Surgeon: Tilda Burrow, MD;  Location: [facility];  Service:  Gynecology;  Laterality: N/A;   Medications: Reviewed & Updated - see associated section                       Current outpatient prescriptions:    calcium-vitamin D (OSCAL WITH D) 500-200 MG-UNIT per tablet, Take 1 tablet by mouth 2 (two) times daily. , Disp: , Rfl:    docusate sodium (COLACE) 100 MG capsule, Take 100 mg by mouth daily., Disp: , Rfl:    Multiple Vitamins-Calcium (ONE-A-DAY WOMENS PO), Take by mouth daily., Disp: , Rfl:    tamoxifen (NOLVADEX) 20 MG tablet, Take 1 tablet (20 mg total) by mouth daily., Disp: 90 tablet, Rfl: 3   venlafaxine (EFFEXOR) 37.5 MG tablet, Take 1 tablet (37.5 mg total) by mouth 2 (two) times daily., Disp: 60 tablet, Rfl: 2   Social History: Reviewed -  reports that she has never smoked. She has never used smokeless tobacco.  Objective Findings:  Vitals: Blood pressure 140/90, height 5\' 4"  (1.626 m), weight 188 lb 8 oz (85.503 kg).  Physical Examination: Abd INcision check:  Dressing and steristrips removed, edges well approximated.   Assessment & Plan:   A:  1. Well-healing incisions over  surgical site   P:  1. Follow-up in 2 weeks for pelvic exam Given note to RTW 07/13/14   This chart was scribed for Jonnie Kind, MD by Tula Nakayama, ED Scribe. This patient was seen in room 1 and the patient's care was started at 11:57 AM.   I personally performed the services described in this documentation, which was scribed in my presence. The recorded information has been reviewed and is accurate., addended as necessary Jonnie Kind

## 2014-07-20 ENCOUNTER — Ambulatory Visit (INDEPENDENT_AMBULATORY_CARE_PROVIDER_SITE_OTHER): Payer: BLUE CROSS/BLUE SHIELD | Admitting: Obstetrics and Gynecology

## 2014-07-20 ENCOUNTER — Encounter: Payer: Self-pay | Admitting: Obstetrics and Gynecology

## 2014-07-20 VITALS — BP 130/90 | Wt 188.0 lb

## 2014-07-20 DIAGNOSIS — Z9889 Other specified postprocedural states: Secondary | ICD-10-CM

## 2014-07-20 MED ORDER — METRONIDAZOLE 0.75 % VA GEL: 1.0000 | Freq: Every day | VAGINAL | Status: DC

## 2014-07-20 NOTE — Progress Notes (Signed)
Patient ID: Vanessa Neal, female   DOB: Jan 22, 1961, 54 y.o.   MRN: [id number]   [facility] Visit  Patient name: Vanessa Neal MRN [id number]  Date of birth: May 10, 1961  CC & HPI:  Vanessa Neal is a 54 y.o. female s/p abdominal hysterectomy and bilateral oophorectomy presenting today for routine post-op follow-up. Surgical site healing well. Pt notes continued, mild pain that becomes worse with cough. She has not had intercourse since the operation.    ROS:  A complete 10 system review of systems was obtained and all systems are negative except as noted in the HPI and PMH.    Pertinent History Reviewed:   Reviewed: Significant for ovarian cyst, abdominal hysterectomy and bilateral oophorectomy  Medical         Past Medical History  Diagnosis Date  . Breast cancer   . Ovarian cyst   . 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 BRCA negative 02/14/2013  . Right thyroid nodule   . Fecal occult blood test positive 04/29/2014                              Surgical Hx:    Past Surgical History  Procedure Laterality Date  . Cholecystectomy    . Cesarean section      X 2  . Lipoma excision      from back  . Colonoscopy  03/11/2012    Procedure: COLONOSCOPY;  Surgeon: Danie Binder, MD;  [facility];  Service: Endoscopy;  Laterality: N/A;  9:30 AM-changed to 9:50 Doris notified  . Breast lumpectomy      right  . Breast cyst excision    . Breast cyst aspiration  01/2012  . Mastectomy Right   . Abdominal hysterectomy N/A 06/16/2014    Procedure: HYSTERECTOMY ABDOMINAL;  Surgeon: Jonnie Kind, MD;  Location: [facility];  Service: Gynecology;  Laterality: N/A;  . Salpingoophorectomy Bilateral 06/16/2014    Procedure: BILATERAL SALPINGO OOPHORECTOMY;  Surgeon: Jonnie Kind, MD;  Location: [facility];  Service: Gynecology;  Laterality: Bilateral;  . Scar revision N/A 06/16/2014    Procedure: SCAR REVISION;  Surgeon: Jonnie Kind, MD;  Location: [facility];   Service: Gynecology;  Laterality: N/A;   Medications: Reviewed & Updated - see associated section                       Current outpatient prescriptions:  .  calcium-vitamin D (OSCAL WITH D) 500-200 MG-UNIT per tablet, Take 1 tablet by mouth 2 (two) times daily. , Disp: , Rfl:  .  docusate sodium (COLACE) 100 MG capsule, Take 100 mg by mouth daily., Disp: , Rfl:  .  Multiple Vitamins-Calcium (ONE-A-DAY WOMENS PO), Take by mouth daily., Disp: , Rfl:  .  tamoxifen (NOLVADEX) 20 MG tablet, Take 1 tablet (20 mg total) by mouth daily., Disp: 90 tablet, Rfl: 3 .  venlafaxine (EFFEXOR) 37.5 MG tablet, Take 1 tablet (37.5 mg total) by mouth 2 (two) times daily., Disp: 60 tablet, Rfl: 2   Social History: Reviewed -  reports that she has never smoked. She has never used smokeless tobacco.  Objective Findings:  Vitals: Blood pressure 130/90, weight 188 lb (85.276 kg).  Physical Examination: General appearance - alert, well appearing, and in no distress Pelvic - normal external genitalia, vulva, vagina  VULVA: normal appearing vulva with no masses, tenderness or lesions VAGINA: vaginal  tissue atrophic; 2 mm granulation tissue on the cuff; yellow-white discharge discharge, no lesions, good vaginal length after the surgery; no pain with manual exam CERVIX: normal appearing cervix without discharge or lesions UTERUS: surgically removed ADNEXA: surgically removed   Assessment & Plan:   A:  1. Well-healing surgical site 2. Continued mild pain worse with coughing 3. Small (24mm) granulation tissue treated with silver nitrite 4. Good vaginal length, good support, no pain with manual exam 5. White cells present in discharge; no trichomonas    P:  1. Resume normal sexual activity 2. Place on Metrogel  This chart was scribed for Jonnie Kind, MD by Tula Nakayama, ED Scribe. This patient was seen in room 2 and the patient's care was started at 9:26 AM.   I personally performed the services  described in this documentation, which was scribed in my presence. The recorded information has been reviewed and considered accurate. It has been edited as necessary during review. Jonnie Kind, MD

## 2014-08-14 ENCOUNTER — Other Ambulatory Visit (HOSPITAL_COMMUNITY): Payer: Self-pay

## 2014-08-14 DIAGNOSIS — C50911 Malignant neoplasm of unspecified site of right female breast: Secondary | ICD-10-CM

## 2014-08-17 ENCOUNTER — Ambulatory Visit (INDEPENDENT_AMBULATORY_CARE_PROVIDER_SITE_OTHER): Payer: BLUE CROSS/BLUE SHIELD | Admitting: Obstetrics and Gynecology

## 2014-08-17 ENCOUNTER — Encounter: Payer: Self-pay | Admitting: Obstetrics and Gynecology

## 2014-08-17 VITALS — BP 150/100 | Ht 64.0 in | Wt 186.0 lb

## 2014-08-17 DIAGNOSIS — Z9889 Other specified postprocedural states: Secondary | ICD-10-CM

## 2014-08-17 DIAGNOSIS — Z9079 Acquired absence of other genital organ(s): Secondary | ICD-10-CM

## 2014-08-17 DIAGNOSIS — Z90710 Acquired absence of both cervix and uterus: Secondary | ICD-10-CM

## 2014-08-17 DIAGNOSIS — Z90722 Acquired absence of ovaries, bilateral: Secondary | ICD-10-CM

## 2014-08-17 NOTE — Progress Notes (Signed)
Patient ID: Vanessa Neal, female   DOB: July 03, 1960, 54 y.o.   MRN: [id number] Pt here today for post op visit. Pt denies any problems or concerns at this time.

## 2014-08-17 NOTE — Progress Notes (Signed)
Patient ID: Vanessa Neal, female   DOB: September 04, 1960, 54 y.o.   MRN: [id number]     Subjective:  Vanessa Neal is a 54 y.o. female now 8 weeks status post abdominal hysterectomy and bilateral oophorectomy. Pt denies any current complaints and reports improvement in pain. She denies dyspareunia.  Review of Systems Negativ r diet:   yes   Bowel movements : normal.  Pain is controlled without any medications.  Objective:  BP 150/100 mmHg  Ht 5\' 4"  (1.626 m)  Wt 186 lb (84.369 kg)  BMI 31.91 kg/m2 General:Well developed, well nourished.  No acute distress. Abdomen: Bowel sounds normal, soft, non-tender. Pelvic Exam:    External Genitalia:  Normal.    Vagina: Normal; well-supported    Cervix: Surgically absent     Uterus: Surgically absent    Adnexa/Bimanual: Surgically absent  Incision(s):   Healing well, no drainage, no erythema, no hernia, no swelling, no dehiscence,     Assessment:  Post-Op 8 weeks s/p total abdominal hysterectomy and bilateral oophorectomy    Pt doing well postoperatively.   Plan:  1.Wound care discussed   2. Resume current medications 3. Activity restrictions: none 4. return to work: now. 5. Follow up prn   This chart was scribed for Jonnie Kind, MD by Tula Nakayama, ED Scribe. This patient was seen in room 1 and the patient's care was started at 9:37 AM.   I personally performed the services described in this documentation, which was SCRIBED in my presence. The recorded information has been reviewed and considered accurate. It has been edited as necessary during review. Jonnie Kind, MD

## 2014-08-18 ENCOUNTER — Encounter (HOSPITAL_BASED_OUTPATIENT_CLINIC_OR_DEPARTMENT_OTHER): Payer: BLUE CROSS/BLUE SHIELD

## 2014-08-18 ENCOUNTER — Other Ambulatory Visit (HOSPITAL_COMMUNITY): Payer: BC Managed Care – PPO

## 2014-08-18 ENCOUNTER — Encounter (HOSPITAL_COMMUNITY): Payer: Self-pay | Admitting: Hematology & Oncology

## 2014-08-18 ENCOUNTER — Encounter (HOSPITAL_COMMUNITY): Payer: BLUE CROSS/BLUE SHIELD | Attending: Hematology & Oncology | Admitting: Hematology & Oncology

## 2014-08-18 VITALS — BP 131/89 | HR 79 | Temp 98.7°F | Resp 14 | Wt 188.7 lb

## 2014-08-18 DIAGNOSIS — C50511 Malignant neoplasm of lower-outer quadrant of right female breast: Secondary | ICD-10-CM | POA: Diagnosis not present

## 2014-08-18 DIAGNOSIS — C50911 Malignant neoplasm of unspecified site of right female breast: Secondary | ICD-10-CM

## 2014-08-18 LAB — TSH: TSH: 3.783 u[IU]/mL (ref 0.350–4.500)

## 2014-08-18 LAB — COMPREHENSIVE METABOLIC PANEL
ALBUMIN: 3.9 g/dL (ref 3.5–5.2)
ALT: 27 U/L (ref 0–35)
AST: 30 U/L (ref 0–37)
Alkaline Phosphatase: 42 U/L (ref 39–117)
Anion gap: 9 (ref 5–15)
BUN: 16 mg/dL (ref 6–23)
CALCIUM: 9.6 mg/dL (ref 8.4–10.5)
CO2: 24 mmol/L (ref 19–32)
Chloride: 106 mmol/L (ref 96–112)
Creatinine, Ser: 0.71 mg/dL (ref 0.50–1.10)
GFR calc Af Amer: >90 mL/min (ref >90)
GFR calc non Af Amer: >90 mL/min (ref >90)
Glucose, Bld: 93 mg/dL (ref 70–99)
Potassium: 3.7 mmol/L (ref 3.5–5.1)
SODIUM: 139 mmol/L (ref 135–145)
TOTAL PROTEIN: 7.7 g/dL (ref 6.0–8.3)
Total Bilirubin: 0.5 mg/dL (ref 0.3–1.2)

## 2014-08-18 LAB — CBC WITH DIFFERENTIAL/PLATELET
BASOS ABS: 0 10*3/uL (ref 0.0–0.1)
BASOS PCT: 1 % (ref 0–1)
EOS ABS: 0.1 10*3/uL (ref 0.0–0.7)
Eosinophils Relative: 2 % (ref 0–5)
HCT: 37.9 % (ref 36.0–46.0)
Hemoglobin: 12.7 g/dL (ref 12.0–15.0)
Lymphocytes Relative: 48 % — ABNORMAL HIGH (ref 12–46)
Lymphs Abs: 3.1 10*3/uL (ref 0.7–4.0)
MCH: 33.6 pg (ref 26.0–34.0)
MCHC: 33.5 g/dL (ref 30.0–36.0)
MCV: 100.3 fL — AB (ref 78.0–100.0)
Monocytes Absolute: 0.4 10*3/uL (ref 0.1–1.0)
Monocytes Relative: 6 % (ref 3–12)
NEUTROS PCT: 43 % (ref 43–77)
Neutro Abs: 2.7 10*3/uL (ref 1.7–7.7)
PLATELETS: 220 10*3/uL (ref 150–400)
RBC: 3.78 MIL/uL — ABNORMAL LOW (ref 3.87–5.11)
RDW: 12.5 % (ref 11.5–15.5)
WBC: 6.3 10*3/uL (ref 4.0–10.5)

## 2014-08-18 LAB — LIPID PANEL
CHOLESTEROL: 188 mg/dL (ref 0–200)
HDL: 54 mg/dL (ref >39)
LDL Cholesterol: 106 mg/dL — ABNORMAL HIGH (ref 0–99)
TRIGLYCERIDES: 140 mg/dL (ref <150)
Total CHOL/HDL Ratio: 3.5 RATIO
VLDL: 28 mg/dL (ref 0–40)

## 2014-08-18 LAB — ESTRADIOL: Estradiol: 19.4 pg/mL

## 2014-08-18 MED ORDER — EXEMESTANE 25 MG PO TABS: 25.0000 mg | ORAL_TABLET | Freq: Every day | ORAL | Status: DC

## 2014-08-18 NOTE — Addendum Note (Signed)
Addended by: Berneta Levins on: 08/18/2014 01:26 PM   Modules accepted: Orders

## 2014-08-18 NOTE — Progress Notes (Signed)
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  Will consider switching to AI when post-menopausal.  INTERVAL HISTORY: Vanessa Neal 54 y.o. 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 Repeat on the primary after definitive surgery showed ER positivity of 98%, PR 57%, HER-2/neu nonamplified, Ki-67 marker 85%. She received dose dense Adriamycin and Cytoxan, followed by weekly Taxol x12 weeks. She had tremendous improvement in the size of the tumor. Pathologically her tumor was 1.1 cm and 3 sentinel nodes were negative. No LV I was seen. Margins were clear. She had this definitive lumpectomy and sentinel node biopsies on 04/13/2010, went on to receive radiation therapy, started tamoxifen on 07/05/2010 consisting of 20 mg once a day.  She has just undergone a TAH/BSO with Dr.Ferguson 8 weeks ago. She denies any change in her baseline hot flashes and states she feels fairly well. She is interested in changing from tamoxifen to an aromatase inhibitor. He has no other major complaints today. She is fairly active doing yard work. 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 She takes Os-Cal ultra in the morning and evening. She gets her mammograms at the [facility] Center in [place].  Past Medical History  Diagnosis Date  . Breast cancer   . Ovarian cyst   . 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 BRCA negative  02/14/2013  . Right thyroid nodule   . 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     has No Known Allergies.  Ms. Hefley does not currently have medications on file.  Past Surgical History  Procedure Laterality Date  . Cholecystectomy    . Cesarean section      X 2  . Lipoma excision      from back  . Colonoscopy  03/11/2012    Procedure: COLONOSCOPY;  Surgeon: Danie Binder, MD;  [facility];  Service: Endoscopy;  Laterality: N/A;  9:30 AM-changed to 9:50 Doris notified  . Breast lumpectomy      right  . Breast cyst excision    . Breast cyst aspiration  01/2012  . Mastectomy Right   . Abdominal hysterectomy N/A 06/16/2014    Procedure: HYSTERECTOMY ABDOMINAL;  Surgeon: Jonnie Kind, MD;  Location: [facility];  Service: Gynecology;  Laterality: N/A;  . Salpingoophorectomy Bilateral 06/16/2014    Procedure: BILATERAL SALPINGO OOPHORECTOMY;  Surgeon: Jonnie Kind, MD;  Location: [facility];  Service: Gynecology;  Laterality: Bilateral;  . Scar revision N/A 06/16/2014    Procedure: SCAR REVISION;  Surgeon: Jonnie Kind, MD;  Location: [facility];  Service: Gynecology;  Laterality: N/A;    Denies any headaches, dizziness, double vision, fevers, chills,  night sweats, nausea, vomiting, diarrhea, chest pain, heart palpitations, shortness of breath, black tarry stool, urinary pain, urinary burning, urinary frequency, hematuria.   PHYSICAL EXAMINATION  ECOG PERFORMANCE STATUS: 0 - Asymptomatic  There were no vitals filed for this visit.  GENERAL:alert, healthy, no distress, well nourished, well developed, comfortable, cooperative and smiling SKIN: skin color, texture, turgor are normal, no rashes or significant lesions HEAD: Normocephalic, No masses, lesions,  tenderness or abnormalities EYES: normal, PERRLA, EOMI, Conjunctiva are pink and non-injected EARS: External ears normal OROPHARYNX:mucous membranes are moist  NECK: supple, no adenopathy, thyroid normal size, non-tender, without nodularity, no stridor, non-tender, trachea midline LYMPH:  no palpable lymphadenopathy, no hepatosplenomegaly BREAST:breasts appear normal, no suspicious masses, no skin or nipple changes or axillary nodes, lumpectomy scar well healed on right. Scar tissue noted in the R axillae LUNGS: clear to auscultation and percussion HEART: regular rate & rhythm, no murmurs, no gallops, S1 normal and S2 normal ABDOMEN:abdomen soft, non-tender, normal bowel sounds, no masses or organomegaly and no hepatosplenomegaly BACK: Back symmetric, no curvature., No CVA tenderness EXTREMITIES:less then 2 second capillary refill, no joint deformities, effusion, or inflammation, no edema, no skin discoloration, no clubbing, no cyanosis  NEURO: alert & oriented x 3 with fluent speech, no focal motor/sensory deficits, gait normal    LABORATORY DATA: CBC    Component Value Date/Time   WBC 9.0 06/17/2014 0534   RBC 3.05* 06/17/2014 0534   HGB 10.6* 06/17/2014 0534   HCT 31.4* 06/17/2014 0534   PLT 181 06/17/2014 0534   MCV 103.0* 06/17/2014 0534   MCH 34.8* 06/17/2014 0534   MCHC 33.8 06/17/2014 0534   RDW 13.1 06/17/2014 0534   LYMPHSABS 2.8 08/15/2013 0946   MONOABS 0.4 08/15/2013 0946   EOSABS 0.1 08/15/2013 0946   BASOSABS 0.0 08/15/2013 0946      Chemistry      Component Value Date/Time   NA 138 06/17/2014 0534   K 3.7 06/17/2014 0534   CL 107 06/17/2014 0534   CO2 26 06/17/2014 0534   BUN 10 06/17/2014 0534   CREATININE 0.70 06/17/2014 0534   CREATININE 0.76 04/29/2014 0919      Component Value Date/Time   CALCIUM 8.0* 06/17/2014 0534   ALKPHOS 41 06/11/2014 0825   AST 28 06/11/2014 0825   ALT 31 06/11/2014 0825   BILITOT 0.7 06/11/2014 0825       ASSESSMENT:   1. Stage II A. infiltrating ductal carcinoma of the right breast with her tumor greater than 2.5 cm by mammography, 3 cm by MRI, ER 75% PR 97% Ki-67 marker high at 85% HER-2/neu nonamplified on her initial biopsy. Repeat on the primary after definitive surgery showed ER positivity of 98%, PR 57%, HER-2/neu nonamplified, Ki-67 marker 85%. She received dose dense Adriamycin and Cytoxan, followed by weekly Taxol x12 weeks. She had tremendous improvement in the size of the tumor. Pathologically her tumor was 1.1 cm and 3 sentinel nodes were negative. No LV I was seen. Margins were clear. She had this definitive lumpectomy and sentinel node biopsies on 04/13/2010, went on to receive radiation therapy, started tamoxifen on 07/05/2010 consisting of 20 mg once a day.  2. BRCA1 or BRCA2 negative 3. History of thyroid goiter 4. S/P TAH/BSO  Patient Active Problem List   Diagnosis Date Noted  . S/P total hysterectomy and bilateral salpingo-oophorectomy 06/16/2014  . Fecal occult blood test positive 04/29/2014  . Right thyroid nodule 04/21/2013  . BRCA negative 02/14/2013  . Infiltrating ductal carcinoma  of right female breast 01/04/2011     PLAN:  She is now postmenopausal and has opted to proceed with an aromatase inhibitor. I have called her in a prescription for Aromasin. She has been instructed to take it once daily. The risks and benefits of this treatment were addressed in detail. I advised her the most common side effect is joint discomfort. We also talked about issues relating to bone health and cholesterol. We will obtain laboratory studies today including FSH, estradiol, vitamin D, fasting lipid profile and CBC, CMP.  I will plan on seeing her back in one month to assess her tolerance. She will need a DEXA scan as a baseline and we have ordered this for her as well.  All questions were answered. The patient knows to call the clinic with any problems, questions or concerns. We can certainly see the  patient much sooner if necessary.  This note was signed electronically.  Jayanth Szczesniak [employer]

## 2014-08-18 NOTE — Patient Instructions (Signed)
[place] at [facility] Discharge Instructions  RECOMMENDATIONS MADE BY THE CONSULTANT AND ANY TEST RESULTS WILL BE SENT TO YOUR REFERRING PHYSICIAN.  Exam and discussion by Dr. Whitney Muse.  Blood work today. Will switch you to Aromasin - take as directed Bone Density needs to be done  Report any new lumps, bone pain, shortness of breath or other symptoms. Follow-up in 6 weeks.  Thank you for choosing [place] at [facility] to provide your oncology and hematology care.  To afford each patient quality time with our provider, please arrive at least 15 minutes before your scheduled appointment time.    You need to re-schedule your appointment should you arrive 10 or more minutes late.  We strive to give you quality time with our providers, and arriving late affects you and other patients whose appointments are after yours.  Also, if you no show three or more times for appointments you may be dismissed from the clinic at the providers discretion.     Again, thank you for choosing [facility].  Our hope is that these requests will decrease the amount of time that you wait before being seen by our physicians.       _____________________________________________________________  Should you have questions after your visit to [facility], please contact our office at [phone] between the hours of 8:30 a.m. and 4:30 p.m.  Voicemails left after 4:30 p.m. will not be returned until the following business day.  For prescription refill requests, have your pharmacy contact our office.

## 2014-08-18 NOTE — Progress Notes (Signed)
Labs drawn

## 2014-08-19 LAB — FOLLICLE STIMULATING HORMONE: FSH: 20.6 m[IU]/mL

## 2014-08-19 LAB — VITAMIN D 25 HYDROXY (VIT D DEFICIENCY, FRACTURES): VIT D 25 HYDROXY: 46.6 ng/mL (ref 30.0–100.0)

## 2014-08-31 ENCOUNTER — Ambulatory Visit (HOSPITAL_COMMUNITY)
Admission: RE | Admit: 2014-08-31 | Discharge: 2014-08-31 | Disposition: A | Discharge status: Home / Self Care | Payer: BLUE CROSS/BLUE SHIELD | Source: Ambulatory Visit | Attending: Hematology & Oncology | Admitting: Hematology & Oncology

## 2014-08-31 DIAGNOSIS — C50911 Malignant neoplasm of unspecified site of right female breast: Secondary | ICD-10-CM | POA: Diagnosis not present

## 2014-08-31 DIAGNOSIS — Z780 Asymptomatic menopausal state: Secondary | ICD-10-CM | POA: Insufficient documentation

## 2014-09-25 ENCOUNTER — Other Ambulatory Visit (HOSPITAL_COMMUNITY): Payer: Self-pay | Admitting: Hematology & Oncology

## 2014-09-25 DIAGNOSIS — M8580 Other specified disorders of bone density and structure, unspecified site: Secondary | ICD-10-CM | POA: Insufficient documentation

## 2014-09-25 DIAGNOSIS — Z79899 Other long term (current) drug therapy: Secondary | ICD-10-CM | POA: Insufficient documentation

## 2014-09-28 ENCOUNTER — Encounter (HOSPITAL_COMMUNITY): Payer: BLUE CROSS/BLUE SHIELD | Attending: Hematology & Oncology

## 2014-09-28 DIAGNOSIS — C50511 Malignant neoplasm of lower-outer quadrant of right female breast: Secondary | ICD-10-CM

## 2014-09-28 DIAGNOSIS — C50911 Malignant neoplasm of unspecified site of right female breast: Secondary | ICD-10-CM | POA: Diagnosis not present

## 2014-09-28 LAB — CBC WITH DIFFERENTIAL/PLATELET
BASOS ABS: 0 10*3/uL (ref 0.0–0.1)
Basophils Relative: 0 % (ref 0–1)
Eosinophils Absolute: 0.1 10*3/uL (ref 0.0–0.7)
Eosinophils Relative: 2 % (ref 0–5)
HCT: 39 % (ref 36.0–46.0)
HEMOGLOBIN: 13.1 g/dL (ref 12.0–15.0)
LYMPHS ABS: 3 10*3/uL (ref 0.7–4.0)
LYMPHS PCT: 49 % — AB (ref 12–46)
MCH: 33.9 pg (ref 26.0–34.0)
MCHC: 33.6 g/dL (ref 30.0–36.0)
MCV: 101 fL — ABNORMAL HIGH (ref 78.0–100.0)
Monocytes Absolute: 0.4 10*3/uL (ref 0.1–1.0)
Monocytes Relative: 6 % (ref 3–12)
Neutro Abs: 2.6 10*3/uL (ref 1.7–7.7)
Neutrophils Relative %: 43 % (ref 43–77)
Platelets: 253 10*3/uL (ref 150–400)
RBC: 3.86 MIL/uL — ABNORMAL LOW (ref 3.87–5.11)
RDW: 12.8 % (ref 11.5–15.5)
WBC: 6.1 10*3/uL (ref 4.0–10.5)

## 2014-09-28 LAB — VITAMIN B12: Vitamin B-12: 360 pg/mL (ref 180–914)

## 2014-09-28 LAB — COMPREHENSIVE METABOLIC PANEL
ALT: 35 U/L (ref 14–54)
AST: 30 U/L (ref 15–41)
Albumin: 3.8 g/dL (ref 3.5–5.0)
Alkaline Phosphatase: 37 U/L — ABNORMAL LOW (ref 38–126)
Anion gap: 8 (ref 5–15)
BILIRUBIN TOTAL: 0.5 mg/dL (ref 0.3–1.2)
BUN: 17 mg/dL (ref 6–20)
CHLORIDE: 103 mmol/L (ref 101–111)
CO2: 27 mmol/L (ref 22–32)
Calcium: 9.4 mg/dL (ref 8.9–10.3)
Creatinine, Ser: 0.73 mg/dL (ref 0.44–1.00)
GFR calc Af Amer: >60 mL/min (ref >60)
Glucose, Bld: 109 mg/dL — ABNORMAL HIGH (ref 70–99)
POTASSIUM: 3.8 mmol/L (ref 3.5–5.1)
SODIUM: 138 mmol/L (ref 135–145)
TOTAL PROTEIN: 7.5 g/dL (ref 6.5–8.1)

## 2014-09-28 LAB — FOLATE: Folate: 38 ng/mL (ref >5.9)

## 2014-09-28 NOTE — Assessment & Plan Note (Addendum)
Bone density in April 2016 demonstrated osteopenia at her baseline exam.  She is currently on Ca++ and Vit D daily.  We will start Prolia every 6 months beginning today.  She is provided patient education regarding Prolia, including a video.   We discussed the risks, benefits, alternatives, and side effects of therapy.  Consent ascertained.  Will start today.  Supportive therapy plan built.

## 2014-09-28 NOTE — Assessment & Plan Note (Addendum)
Stage II A. infiltrating ductal carcinoma of the right breast with her tumor greater than 2.5 cm by mammography, 3 cm by MRI, ER 75% PR 97% Ki-67 marker high at 85% HER-2/neu nonamplified on her initial biopsy. Repeat on the primary after definitive surgery showed ER positivity of 98%, PR 57%, HER-2/neu nonamplified, Ki-67 marker 85%. She received dose dense Adriamycin and Cytoxan, followed by weekly Taxol x12 weeks. She had tremendous improvement in the size of the tumor. Pathologically her tumor was 1.1 cm and 3 sentinel nodes were negative. No LV I was seen. Margins were clear. She had this definitive lumpectomy and sentinel node biopsies on 04/13/2010, went on to receive radiation therapy, started tamoxifen on 07/05/2010 consisting of 20 mg once a day until she became postmenopausal at which time we switched to Aromasin beginning on 08/19/3014.  She is here for follow-up of tolerance in endocrine therapy.  Return in 3 months for follow-up with labs: CBC diff, CMET.  If doing well, we will consider spacing out follow-up appointments to every 6 months.  Continue annual mammography.  We discussed BCI testing and she is agreeable to have this testing completed to help determine the utility of extended endocrine therapy.  I will fill out the paperwork and she knows to expect a phone call from [place].

## 2014-09-28 NOTE — Progress Notes (Signed)
Labs drawn

## 2014-09-28 NOTE — Progress Notes (Signed)
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   INTERVAL HISTORY: Vanessa Neal 54 y.o. female returns for followup of stage II A. infiltrating ductal carcinoma of the right breast with her tumor greater than 2.5 cm by mammography, 3 cm by MRI, ER 75% PR 97% Ki-67 marker high at 85% HER-2/neu nonamplified on her initial biopsy. Repeat on the primary after definitive surgery showed ER positivity of 98%, PR 57%, HER-2/neu nonamplified, Ki-67 marker 85%. She received dose dense Adriamycin and Cytoxan, followed by weekly Taxol x12 weeks. She had tremendous improvement in the size of the tumor. Pathologically her tumor was 1.1 cm and 3 sentinel nodes were negative. No LV I was seen. Margins were clear. She had this definitive lumpectomy and sentinel node biopsies on 04/13/2010, went on to receive radiation therapy, started tamoxifen on 07/05/2010 consisting of 20 mg once a day until she became postmenopausal at which time we switched to Aromasin beginning on 08/19/3014.    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 Pablo Ledger at [facility]   07/05/2010 - 08/18/2014 Chemotherapy Tamoxifen 20 mg daily   08/19/2014 -  Anti-estrogen oral therapy Aromasin daily    I personally reviewed and went over laboratory results with the patient.  The results are noted within this dictation.  I personally reviewed and went over radiographic studies with the patient.  The results are noted within this dictation.  Her bone density demonstrates osteopenia.  Given the fact that she is on an AI, she would benefit from Prolia treatment.  She is provided a video regarding Prolia and its mechanism of action.   She notes it was helpful.  We discussed the risks, benefits, alternatives, and side effects of treatment including hypocalcemia and ONJ.  She will report future dental procedures to [place].  I spent time reviewing the role of BCI testing.  She is agreeable to have this done to help provide her information regarding the utility of extended endocrine therapy.  She admits to hot flashes associated with Aromasin.  Oncologically, she denies any complaints and ROS questioning is negative.  Past Medical History  Diagnosis Date  . Breast cancer   . Ovarian cyst   . 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 BRCA negative 02/14/2013  . Right thyroid nodule   . 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     has No Known Allergies.  We administered denosumab.  Past  Surgical History  Procedure Laterality Date  . Cholecystectomy    . Cesarean section      X 2  . Lipoma excision      from back  . Colonoscopy  03/11/2012    Procedure:  COLONOSCOPY;  Surgeon: Danie Binder, MD;  Location: [facility];  Service: Endoscopy;  Laterality: N/A;  9:30 AM-changed to 9:50 Doris notified  . Breast lumpectomy      right  . Breast cyst excision    . Breast cyst aspiration  01/2012  . Mastectomy Right   . Abdominal hysterectomy N/A 06/16/2014    Procedure: HYSTERECTOMY ABDOMINAL;  Surgeon: Jonnie Kind, MD;  Location: [facility];  Service: Gynecology;  Laterality: N/A;  . Salpingoophorectomy Bilateral 06/16/2014    Procedure: BILATERAL SALPINGO OOPHORECTOMY;  Surgeon: Jonnie Kind, MD;  Location: [facility];  Service: Gynecology;  Laterality: Bilateral;  . Scar revision N/A 06/16/2014    Procedure: SCAR REVISION;  Surgeon: Jonnie Kind, MD;  Location: [facility];  Service: Gynecology;  Laterality: N/A;    Denies any headaches, dizziness, double vision, fevers, chills, night sweats, nausea, vomiting, diarrhea, constipation, chest pain, heart palpitations, shortness of breath, blood in stool, black tarry stool, urinary pain, urinary burning, urinary frequency, hematuria.   PHYSICAL EXAMINATION  ECOG PERFORMANCE STATUS: 0 - Asymptomatic  Filed Vitals:   09/29/14 0900  BP: 138/94  Pulse: 89  Temp: 99.2 F (37.3 C)  Resp: 14    GENERAL:alert, no distress, well nourished, well developed, comfortable, cooperative and smiling SKIN: skin color, texture, turgor are normal, no rashes or significant lesions HEAD: Normocephalic, No masses, lesions, tenderness or abnormalities EYES: normal, PERRLA, EOMI, Conjunctiva are pink and non-injected EARS: External ears normal OROPHARYNX:lips, buccal mucosa, and tongue normal and mucous membranes are moist  NECK: supple, no adenopathy, trachea midline LYMPH:  no palpable lymphadenopathy BREAST:not examined LUNGS: not examined HEART: not examined ABDOMEN:not examined BACK: Back symmetric, no curvature. EXTREMITIES:less then 2 second capillary refill, no joint deformities, effusion, or  inflammation, no skin discoloration  NEURO: alert & oriented x 3 with fluent speech, no focal motor/sensory deficits, gait normal   LABORATORY DATA: CBC    Component Value Date/Time   WBC 6.1 09/28/2014 0848   RBC 3.86* 09/28/2014 0848   HGB 13.1 09/28/2014 0848   HCT 39.0 09/28/2014 0848   PLT 253 09/28/2014 0848   MCV 101.0* 09/28/2014 0848   MCH 33.9 09/28/2014 0848   MCHC 33.6 09/28/2014 0848   RDW 12.8 09/28/2014 0848   LYMPHSABS 3.0 09/28/2014 0848   MONOABS 0.4 09/28/2014 0848   EOSABS 0.1 09/28/2014 0848   BASOSABS 0.0 09/28/2014 0848      Chemistry      Component Value Date/Time   NA 138 09/28/2014 0848   K 3.8 09/28/2014 0848   CL 103 09/28/2014 0848   CO2 27 09/28/2014 0848   BUN 17 09/28/2014 0848   CREATININE 0.73 09/28/2014 0848   CREATININE 0.76 04/29/2014 0919      Component Value Date/Time   CALCIUM 9.4 09/28/2014 0848   ALKPHOS 37* 09/28/2014 0848   AST 30 09/28/2014 0848   ALT 35 09/28/2014 0848   BILITOT 0.5 09/28/2014 0848       RADIOGRAPHIC STUDIES:  Dg Bone Density  08/31/2014   EXAM: DUAL X-RAY ABSORPTIOMETRY (DXA) FOR BONE MINERAL DENSITY  IMPRESSION: Ordering Physician:  Dr. Patrici Ranks,  Your patient SHAHAD MAZUREK completed a BMD test on 08/31/2014 using the [facility]  DXA System (software version: 14.10) manufactured by [employer]. The following summarizes the results of our evaluation. PATIENT BIOGRAPHICAL: Name: AMELIANNA, MELLER Patient ID: [id number] Birth Date: March 20, 1961 Height: 64.0 in. Gender: Female Exam Date: 08/31/2014 Weight: 180.0 lbs. Indications: Bilateral Oophrectomy, Caucasian, Early Menopause, Hx Breast Ca Fractures: Treatments: Aromasin, Calcium, Multivitamin, Vitamin D DENSITOMETRY RESULTS: Site      Region    Measured Date Measured Age WHO Classification Young Adult T-score BMD         %Change vs. Previous Significant Change (*) [facility] Spine L1-L2 08/31/2014 53.8 Osteopenia -1.3 1.005 g/cm2 - -  DualFemur  Neck Left 08/31/2014 53.8 Osteopenia -1.8 0.782 g/cm2 - - ASSESSMENT: BMD as determined from Femur Neck Left is 0.782 g/cm2 with a T-Score of -1.8. This patient is considered osteopenic according to [place]) criteria.(L-3 and L-4 were excluded due to advanced degenerative changes.) See FRAX report on next page. World Pharmacologist (WHO) criteria for post-menopausal, Caucasian Women: Normal:       T-score at or above -1 SD Osteopenia:   T-score between -1 and -2.5 SD Osteoporosis: T-score at or below -2.5 SD  RECOMMENDATIONS: [place] recommends that FDA-approved medial therapies be considered in postmenopausal women and men age 46 or older with a: 1. Hip or vertberal (clinical or morphometric) fracture. 2. T-Score of < -2.5 at the spine or hip. 3. Ten-year fracture probability by FRAX of 3% or greater for hip fracture or 20% or greater for major osteoporotic fracture.  All treatment decisions require clinical judgment and consideration of indiviual patient factors, including patient preferences, co-morbidities, previous drug use, risk factors not captured in the FRAX model (e.g. falls, vitamin D deficiency, increased bone turnover, interval significant decline in bone density) and possible under-or over-estimation of fracture risk by FRAX.  All patients should ensure an adequate intake of dietary calcium (1200 mg/d) and vitamin D (800 IU daily) unless contraindicated.  FOLLOW-UP: People with diagnosed cases of osteoporosis or osteopenia should be regularly tested for bone mineral density. For patients eligible for Medicare, routine testing is allowed once every 2 years. Testing frequency can be increased for patients who have rapidly progressing disease, or for those who are receiving medical therapy to restore bone mass.  I have reviewed this report, and agree with the above findings.  [facility] Dear Dr. Patrici Ranks,  Your patient SHAOLIN ARMAS completed a FRAX assessment on 08/31/2014 using the [employer] (analysis version: 14.10) manufactured by [employer]. The following summarizes the results of our evaluation.  PATIENT BIOGRAPHICAL: Name: DALORES, WEGER Patient ID: [id number] Birth Date: [birth year] Height:    64.0 in. Gender:     Female    [age]       Weight:    180.0 lbs. Ethnicity:  White                            Exam Date: 08/31/2014  FRAX* RESULTS:  (version: 3.5) 10-year Probability of Fracture1 Major Osteoporotic Fracture2 Hip Fracture 6.6% 0.7% Population: [place] (Caucasian) Risk Factors: None  Based on Femur (Left) Neck BMD  1 -The 10-year probability of fracture may be lower than reported if the patient has received treatment. 2 -Major Osteoporotic Fracture: Clinical Spine, Forearm, Hip or Shoulder  *FRAX is a Materials engineer of the [employer] for Metabolic Bone Disease,  a [place] (WHO) [facility].  ASSESSMENT: The probability of a major osteoporotic fracture is 6.6% within the next ten years.  The probability of a hip fracture is 0.7% within the next ten years.   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 infiltrating ductal carcinoma of the right breast with her tumor greater than 2.5 cm by mammography, 3 cm by MRI, ER 75% PR 97% Ki-67 marker high at 85% HER-2/neu nonamplified on her initial biopsy. Repeat on the primary after definitive surgery showed ER positivity of 98%, PR 57%, HER-2/neu nonamplified, Ki-67 marker 85%. She received dose dense Adriamycin and Cytoxan, followed by weekly Taxol x12 weeks. She had tremendous improvement in the size of the tumor. Pathologically her tumor was 1.1 cm and 3 sentinel nodes were negative. No LV I was seen. Margins were clear. She had this definitive lumpectomy and sentinel node biopsies on  04/13/2010, went on to receive radiation therapy, started tamoxifen on 07/05/2010 consisting of 20 mg once a day until she became postmenopausal at which time we switched to Aromasin beginning on 08/19/3014.  She is here for follow-up of tolerance in endocrine therapy.  Return in 3 months for follow-up with labs: CBC diff, CMET.  If doing well, we will consider spacing out follow-up appointments to every 6 months.  Continue annual mammography.  We discussed BCI testing and she is agreeable to have this testing completed to help determine the utility of extended endocrine therapy.  I will fill out the paperwork and she knows to expect a phone call from [place].    Osteopenia determined by x-ray Bone density in April 2016 demonstrated osteopenia at her baseline exam.  She is currently on Ca++ and Vit D daily.  We will start Prolia every 6 months beginning today.  She is provided patient education regarding Prolia, including a video.   We discussed the risks, benefits, alternatives, and side effects of therapy.  Consent ascertained.  Will start today.  Supportive therapy plan built.    THERAPY PLAN:  NCCN guidelines recommends the following surveillance for invasive breast cancer:  A. History and Physical exam every 4-6 months for 5 years and then every 12 months.  B. Mammography every 12 months  C. Women on Tamoxifen: annual gynecologic assessment every 12 months if uterus is present.  D. Women on aromatase inhibitor or who experience ovarian failure secondary to treatment should have monitoring of bone health with a bone mineral density determination at baseline and periodically thereafter.  E. Assess and encourage adherence to adjuvant endocrine therapy.  F. Evidence suggests that active lifestyle and achieving and maintaining an ideal body weight (20-25 BMI) may lead to optimal breast cancer outcomes.   All questions were answered. The patient knows to call the clinic with any  problems, questions or concerns. We can certainly see the patient much sooner if necessary.  Patient and plan discussed with Dr. Ancil Linsey and she is in agreement with the aforementioned.   This note is electronically signed by: Robynn Pane 09/29/2014 10:28 AM

## 2014-09-29 ENCOUNTER — Other Ambulatory Visit (HOSPITAL_COMMUNITY): Payer: BLUE CROSS/BLUE SHIELD

## 2014-09-29 ENCOUNTER — Encounter (HOSPITAL_COMMUNITY): Payer: BLUE CROSS/BLUE SHIELD

## 2014-09-29 ENCOUNTER — Encounter (HOSPITAL_COMMUNITY): Payer: Self-pay | Admitting: Oncology

## 2014-09-29 ENCOUNTER — Encounter (HOSPITAL_BASED_OUTPATIENT_CLINIC_OR_DEPARTMENT_OTHER): Payer: BLUE CROSS/BLUE SHIELD | Admitting: Oncology

## 2014-09-29 VITALS — BP 138/94 | HR 89 | Temp 99.2°F | Resp 14 | Wt 194.2 lb

## 2014-09-29 DIAGNOSIS — C50511 Malignant neoplasm of lower-outer quadrant of right female breast: Secondary | ICD-10-CM

## 2014-09-29 DIAGNOSIS — Z79899 Other long term (current) drug therapy: Secondary | ICD-10-CM

## 2014-09-29 DIAGNOSIS — Z90722 Acquired absence of ovaries, bilateral: Secondary | ICD-10-CM

## 2014-09-29 DIAGNOSIS — Z170 Estrogen receptor positive status [ER+]: Secondary | ICD-10-CM | POA: Diagnosis not present

## 2014-09-29 DIAGNOSIS — Z9079 Acquired absence of other genital organ(s): Secondary | ICD-10-CM

## 2014-09-29 DIAGNOSIS — M8580 Other specified disorders of bone density and structure, unspecified site: Secondary | ICD-10-CM

## 2014-09-29 DIAGNOSIS — C50911 Malignant neoplasm of unspecified site of right female breast: Secondary | ICD-10-CM

## 2014-09-29 DIAGNOSIS — Z90710 Acquired absence of both cervix and uterus: Secondary | ICD-10-CM

## 2014-09-29 MED ORDER — DENOSUMAB 60 MG/ML ~~LOC~~ SOLN
60.0000 mg | Freq: Once | SUBCUTANEOUS | Status: AC
Start: 2014-09-29 — End: 2014-09-29
  Administered 2014-09-29: 60 mg via SUBCUTANEOUS
  Filled 2014-09-29: qty 1

## 2014-09-29 NOTE — Progress Notes (Signed)
Vanessa Neal presents today for injection per MD orders. Prolia 60 mg administered SQ in right Abdomen. Administration without incident. Patient tolerated well.

## 2014-09-29 NOTE — Patient Instructions (Addendum)
[place] at [facility] Discharge Instructions  RECOMMENDATIONS MADE BY THE CONSULTANT AND ANY TEST RESULTS WILL BE SENT TO YOUR REFERRING PHYSICIAN.  Exam and discussion by Robynn Pane, PA-C Potential risks and benefits of prolia discussed. Continue the aromasin. Report any new lumps, bone pain, shortness of breath or other symptoms.  Labs and Prolia every 6 months Labs and office visit in 3 months.    Thank you for choosing [place] at [facility] to provide your oncology and hematology care.  To afford each patient quality time with our provider, please arrive at least 15 minutes before your scheduled appointment time.    You need to re-schedule your appointment should you arrive 10 or more minutes late.  We strive to give you quality time with our providers, and arriving late affects you and other patients whose appointments are after yours.  Also, if you no show three or more times for appointments you may be dismissed from the clinic at the providers discretion.     Again, thank you for choosing [facility].  Our hope is that these requests will decrease the amount of time that you wait before being seen by our physicians.       _____________________________________________________________  Should you have questions after your visit to [facility], please contact our office at [phone] between the hours of 8:30 a.m. and 4:30 p.m.  Voicemails left after 4:30 p.m. will not be returned until the following business day.  For prescription refill requests, have your pharmacy contact our office.

## 2014-09-29 NOTE — Progress Notes (Signed)
Please see doctors encounter for more information 

## 2014-09-30 ENCOUNTER — Other Ambulatory Visit (HOSPITAL_COMMUNITY): Payer: Self-pay | Admitting: Oncology

## 2014-10-01 ENCOUNTER — Telehealth (HOSPITAL_COMMUNITY): Payer: Self-pay | Admitting: Oncology

## 2014-10-01 NOTE — Telephone Encounter (Signed)
PC TO [employer] TO SEE IF A RETRO AUTH COULD BE DONE FOR PROLIA. PER CSR ANGELA S THEY DO NOT ACCEPT RETRO REQUEST. [facility] [phone]

## 2014-10-09 ENCOUNTER — Encounter (HOSPITAL_COMMUNITY): Payer: Self-pay

## 2014-10-20 ENCOUNTER — Telehealth (HOSPITAL_COMMUNITY): Payer: Self-pay

## 2014-10-20 NOTE — Telephone Encounter (Signed)
-----   Message from Baird Cancer, PA-C sent at 10/20/2014  4:30 PM EDT ----- Regarding: FW: question Increase Effexor by an additional tablet.   Update [place] in 1 week.    ----- Message -----    From: Berneta Levins, RN    Sent: 10/20/2014   1:41 PM      To: Baird Cancer, PA-C, Patrici Ranks, MD Subject: Melton Alar: question                                     ----- Message -----    From: Vanessa Neal    Sent: 10/20/2014  10:26 AM      To: Onc Nurse Ap Subject: question                                       Patient called and said her hot flashes are getting worse.  She is on effexor and wants to know if there is anything she can take to help them.  Thanks

## 2014-10-20 NOTE — Telephone Encounter (Signed)
Patient notified and verbalized understanding of instructions. 

## 2014-10-28 ENCOUNTER — Telehealth (HOSPITAL_COMMUNITY): Payer: Self-pay | Admitting: *Deleted

## 2014-10-28 ENCOUNTER — Other Ambulatory Visit (HOSPITAL_COMMUNITY): Payer: Self-pay | Admitting: Oncology

## 2014-10-28 DIAGNOSIS — T451X5A Adverse effect of antineoplastic and immunosuppressive drugs, initial encounter: Secondary | ICD-10-CM

## 2014-10-28 DIAGNOSIS — R232 Flushing: Secondary | ICD-10-CM

## 2014-10-28 MED ORDER — VENLAFAXINE HCL 37.5 MG PO TABS: 37.5000 mg | ORAL_TABLET | Freq: Three times a day (TID) | ORAL | Status: DC

## 2014-10-29 ENCOUNTER — Encounter: Payer: Self-pay | Admitting: Oncology

## 2014-11-09 ENCOUNTER — Telehealth (HOSPITAL_COMMUNITY): Payer: Self-pay | Admitting: *Deleted

## 2014-11-09 NOTE — Telephone Encounter (Signed)
Patient notified that she needs to notify Laureen with [employer] @ [phone] to discuss her insurance covering [place]. Pt said she would call.

## 2014-12-30 ENCOUNTER — Encounter (HOSPITAL_COMMUNITY): Payer: Self-pay | Admitting: Oncology

## 2014-12-30 ENCOUNTER — Encounter (HOSPITAL_BASED_OUTPATIENT_CLINIC_OR_DEPARTMENT_OTHER): Payer: BLUE CROSS/BLUE SHIELD

## 2014-12-30 ENCOUNTER — Encounter (HOSPITAL_COMMUNITY): Payer: BLUE CROSS/BLUE SHIELD | Attending: Hematology & Oncology | Admitting: Oncology

## 2014-12-30 VITALS — BP 128/86 | HR 86 | Temp 98.4°F | Resp 16 | Wt 197.0 lb

## 2014-12-30 DIAGNOSIS — C50911 Malignant neoplasm of unspecified site of right female breast: Secondary | ICD-10-CM

## 2014-12-30 DIAGNOSIS — M2550 Pain in unspecified joint: Secondary | ICD-10-CM

## 2014-12-30 DIAGNOSIS — M8580 Other specified disorders of bone density and structure, unspecified site: Secondary | ICD-10-CM | POA: Diagnosis not present

## 2014-12-30 DIAGNOSIS — C50511 Malignant neoplasm of lower-outer quadrant of right female breast: Secondary | ICD-10-CM

## 2014-12-30 LAB — COMPREHENSIVE METABOLIC PANEL
ALT: 44 U/L (ref 14–54)
ANION GAP: 10 (ref 5–15)
AST: 36 U/L (ref 15–41)
Albumin: 4.3 g/dL (ref 3.5–5.0)
Alkaline Phosphatase: 35 U/L — ABNORMAL LOW (ref 38–126)
BUN: 15 mg/dL (ref 6–20)
CALCIUM: 9.9 mg/dL (ref 8.9–10.3)
CO2: 27 mmol/L (ref 22–32)
CREATININE: 0.79 mg/dL (ref 0.44–1.00)
Chloride: 100 mmol/L — ABNORMAL LOW (ref 101–111)
GFR calc Af Amer: >60 mL/min (ref >60)
GFR calc non Af Amer: >60 mL/min (ref >60)
Glucose, Bld: 131 mg/dL — ABNORMAL HIGH (ref 65–99)
Potassium: 4.5 mmol/L (ref 3.5–5.1)
Sodium: 137 mmol/L (ref 135–145)
Total Bilirubin: 0.7 mg/dL (ref 0.3–1.2)
Total Protein: 7.8 g/dL (ref 6.5–8.1)

## 2014-12-30 LAB — CBC WITH DIFFERENTIAL/PLATELET
BASOS PCT: 1 % (ref 0–1)
Basophils Absolute: 0 10*3/uL (ref 0.0–0.1)
Eosinophils Absolute: 0.1 10*3/uL (ref 0.0–0.7)
Eosinophils Relative: 1 % (ref 0–5)
HCT: 40.4 % (ref 36.0–46.0)
HEMOGLOBIN: 13.9 g/dL (ref 12.0–15.0)
LYMPHS ABS: 3.3 10*3/uL (ref 0.7–4.0)
Lymphocytes Relative: 49 % — ABNORMAL HIGH (ref 12–46)
MCH: 34.2 pg — AB (ref 26.0–34.0)
MCHC: 34.4 g/dL (ref 30.0–36.0)
MCV: 99.3 fL (ref 78.0–100.0)
Monocytes Absolute: 0.4 10*3/uL (ref 0.1–1.0)
Monocytes Relative: 7 % (ref 3–12)
NEUTROS ABS: 2.8 10*3/uL (ref 1.7–7.7)
NEUTROS PCT: 43 % (ref 43–77)
Platelets: 226 10*3/uL (ref 150–400)
RBC: 4.07 MIL/uL (ref 3.87–5.11)
RDW: 13 % (ref 11.5–15.5)
WBC: 6.6 10*3/uL (ref 4.0–10.5)

## 2014-12-30 MED ORDER — IBUPROFEN 800 MG PO TABS: 800.0000 mg | ORAL_TABLET | Freq: Two times a day (BID) | ORAL | Status: DC

## 2014-12-30 NOTE — Progress Notes (Signed)
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   INTERVAL HISTORY: Vanessa Neal 54 y.o. female returns for followup of stage II A. infiltrating ductal carcinoma of the right breast with her tumor greater than 2.5 cm by mammography, 3 cm by MRI, ER 75% PR 97% Ki-67 marker high at 85% HER-2/neu nonamplified on her initial biopsy. Repeat on the primary after definitive surgery showed ER positivity of 98%, PR 57%, HER-2/neu nonamplified, Ki-67 marker 85%. She received dose dense Adriamycin and Cytoxan, followed by weekly Taxol x12 weeks. She had tremendous improvement in the size of the tumor. Pathologically her tumor was 1.1 cm and 3 sentinel nodes were negative. No LV I was seen. Margins were clear. She had this definitive lumpectomy and sentinel node biopsies on 04/13/2010, went on to receive radiation therapy, started tamoxifen on 07/05/2010 consisting of 20 mg once a day until she became postmenopausal at which time we switched to Aromasin beginning on 08/19/3014.  BCI testing completed in May 2016 demonstrates a low risk of recurrence and a high likelihood of benefit from extended endocrine therapy.      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 Pablo Ledger at [facility]   07/05/2010 - 08/18/2014 Chemotherapy Tamoxifen 20 mg daily   08/19/2014 -  Anti-estrogen oral therapy Aromasin daily   09/29/2014 Survivorship Prolia started for osteopenia in the setting of Aromatase inhibitor.   10/08/2014 Pathology Results BCI- low risk of late recurrence (2.7% btweenyrs 5-10), high likelihood of benefit from extended endocrine therapy, high relative risk reduction (16.5% absolute benefit), low risk of overall recurrence (4.8% btween yrs 0-10), and high benefit from EET.    I personally reviewed and went over laboratory results with the patient.  The results are noted within this dictation.  I spent time reviewing the results of BCI testing.  This information is added into the oncology history.  Essentially, the patient has a low risk of recurrence, and a high likelihood of benefiting from extended endocrine therapy.   Since starting Prolia, she notes LE joint pain in knees and ankles.  She reports some joint aches in her fingers with difficulty closing her hands in the AM that resolves with movement.  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  She denies any falls.  She is compliant with her AI treatment.  She wants to blame her arthralgias on Prolia which can occur up to 14% of the time.  I discussed Reclast therapy potentially in the future.  HOWEVER, she started her AI treatment in March of this year.  So it is difficult to decipher the cause of her arthralgias, but we need to keep both treatments in mind.   Past Medical History  Diagnosis Date  . Breast cancer   . Ovarian cyst   . Infiltrating ductal carcinoma of  right female breast 01/04/2011  . BRCA negative 02/14/2013  . Right thyroid nodule   . 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     has No Known Allergies.  [name] had no medications administered during this visit.  Past Surgical History  Procedure Laterality Date  . Cholecystectomy    . Cesarean section      X 2  . Lipoma excision      from back  . Colonoscopy  03/11/2012    Procedure: COLONOSCOPY;  Surgeon: Danie Binder, MD;  Location: [facility];  Service: Endoscopy;  Laterality: N/A;  9:30 AM-changed to 9:50 Doris notified  . Breast lumpectomy      right  . Breast cyst excision    . Breast cyst aspiration  01/2012  . Mastectomy Right   . Abdominal hysterectomy N/A 06/16/2014    Procedure: HYSTERECTOMY ABDOMINAL;  Surgeon: Jonnie Kind, MD;  Location: [facility];  Service: Gynecology;  Laterality: N/A;  . Salpingoophorectomy Bilateral 06/16/2014    Procedure: BILATERAL SALPINGO OOPHORECTOMY;  Surgeon: Jonnie Kind, MD;  Location: [facility];  Service: Gynecology;  Laterality: Bilateral;  . Scar revision N/A 06/16/2014    Procedure: SCAR REVISION;  Surgeon: Jonnie Kind, MD;  Location: [facility];  Service: Gynecology;  Laterality: N/A;    Denies any headaches, dizziness, double vision, fevers, chills, night sweats, nausea, vomiting, diarrhea, constipation, chest pain, heart palpitations, shortness of breath, blood in stool, black tarry stool, urinary pain, urinary burning, urinary frequency, hematuria.   PHYSICAL EXAMINATION  ECOG PERFORMANCE STATUS: 0 - Asymptomatic  Filed Vitals:   12/30/14 0900  BP: 128/86  Pulse: 86  Temp: 98.4 F (36.9 C)  Resp: 16    GENERAL:alert, no distress, well nourished, well developed, comfortable, cooperative and smiling SKIN: skin color, texture, turgor are normal, no rashes or significant lesions HEAD: Normocephalic, No masses, lesions, tenderness or abnormalities EYES: normal, PERRLA, EOMI, Conjunctiva are pink and non-injected EARS: External ears normal OROPHARYNX:lips,  buccal mucosa, and tongue normal and mucous membranes are moist  NECK: supple, no adenopathy, trachea midline LYMPH:  no palpable lymphadenopathy BREAST:not examined LUNGS: CTA B/L HEART: RRR without murmur ABDOMEN:+ BS x 4, nontender. BACK: Back symmetric, no curvature. EXTREMITIES:less then 2 second capillary refill, no joint deformities, effusion, or inflammation, no skin discoloration  NEURO: alert & oriented x 3 with fluent speech, no focal motor/sensory deficits, gait normal   LABORATORY DATA: CBC    Component Value Date/Time   WBC 6.6 12/30/2014 0847   RBC 4.07 12/30/2014 0847   HGB 13.9 12/30/2014 0847   HCT 40.4 12/30/2014 0847   PLT 226 12/30/2014 0847   MCV 99.3 12/30/2014 0847   MCH 34.2* 12/30/2014 0847   MCHC 34.4 12/30/2014 0847   RDW 13.0 12/30/2014 0847   LYMPHSABS 3.3 12/30/2014 0847   MONOABS 0.4 12/30/2014 0847   EOSABS 0.1 12/30/2014 0847   BASOSABS 0.0 12/30/2014 0847      Chemistry      Component Value Date/Time   NA 137 12/30/2014 0847   K 4.5 12/30/2014 0847   CL 100* 12/30/2014 0847   CO2 27 12/30/2014 0847   BUN 15 12/30/2014 0847   CREATININE 0.79 12/30/2014 0847   CREATININE 0.76 04/29/2014 0919  Component Value Date/Time   CALCIUM 9.9 12/30/2014 0847   ALKPHOS 35* 12/30/2014 0847   AST 36 12/30/2014 0847   ALT 44 12/30/2014 0847   BILITOT 0.7 12/30/2014 0847       RADIOGRAPHIC STUDIES:  No results found.    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 infiltrating ductal carcinoma of the right breast with her tumor greater than 2.5 cm by mammography, 3 cm by MRI, ER 75% PR 97% Ki-67 marker high at 85% HER-2/neu nonamplified on her initial biopsy. Repeat on the primary after definitive surgery showed ER positivity of 98%, PR 57%, HER-2/neu nonamplified, Ki-67 marker 85%. She received dose dense Adriamycin and Cytoxan, followed by weekly Taxol x12 weeks. She had tremendous improvement  in the size of the tumor. Pathologically her tumor was 1.1 cm and 3 sentinel nodes were negative. No LV I was seen. Margins were clear. She had this definitive lumpectomy and sentinel node biopsies on 04/13/2010, went on to receive radiation therapy, started tamoxifen on 07/05/2010 consisting of 20 mg once a day until she became postmenopausal at which time we switched to Aromasin beginning on 08/19/3014.  BCI testing completed in May 2016 demonstrates a low risk of recurrence and a high likelihood of benefit from extended endocrine therapy.    Labs today as ordered: CBC diff, CMET.    Labs in 3 months for possible prolia: calcium  Continue annual mammography.  We reviewed the results of BCI testing.  Oncology history if updated.  Return in 3 months for follow-up.  She is to call us with continued or progressive arthralgias as this may be from AI treatment.  I have provided an Rx for Ibuprofen 800 mg BID PRN.  When she returns in 3 months, we will need to evaluate for resolution of arthralgias and if so, then we may need to consider other osteopenia interventions.  If not resolved by November, we will need to consider side effects from AI treatment.  Osteopenia determined by x-ray Bone density in April 2016 demonstrated osteopenia at her baseline exam.  She is currently on Ca++ and Vit D daily and Prolia every 6 months beginning in May 2016.    She notes increased arthralgias since her Prolia injection in May 2016, however, she also started AI treatment in March 2016.  No sure yet which is causing her arthralgias.  Ibuprofen 800 mg BID PRN escribed.    If arthralgias not resolved by November, then we need to consider AI treatment at the cause of arthralgias.  If better, then Prolia may be the culprit and we can consider Reclast therapy as an alternative option.  If arthralgias progress, then we need to consider AI treatment at the cause and address accordingly.  She will contact us.   THERAPY  PLAN:  NCCN guidelines recommends the following surveillance for invasive breast cancer:  A. History and Physical exam every 4-6 months for 5 years and then every 12 months.  B. Mammography every 12 months  C. Women on Tamoxifen: annual gynecologic assessment every 12 months if uterus is present.  D. Women on aromatase inhibitor or who experience ovarian failure secondary to treatment should have monitoring of bone health with a bone mineral density determination at baseline and periodically thereafter.  E. Assess and encourage adherence to adjuvant endocrine therapy.  F. Evidence suggests that active lifestyle and achieving and maintaining an ideal body weight (20-25 BMI) may lead to optimal breast cancer outcomes.  All questions were answered. The patient knows to call the clinic with any problems, questions or concerns. We can certainly see the patient much sooner if necessary.  Patient and plan discussed with Dr. Ancil Linsey and she is in agreement with the aforementioned.   This note is electronically signed by: Robynn Pane 12/30/2014 10:06 AM

## 2014-12-30 NOTE — Assessment & Plan Note (Addendum)
Stage II A. infiltrating ductal carcinoma of the right breast with her tumor greater than 2.5 cm by mammography, 3 cm by MRI, ER 75% PR 97% Ki-67 marker high at 85% HER-2/neu nonamplified on her initial biopsy. Repeat on the primary after definitive surgery showed ER positivity of 98%, PR 57%, HER-2/neu nonamplified, Ki-67 marker 85%. She received dose dense Adriamycin and Cytoxan, followed by weekly Taxol x12 weeks. She had tremendous improvement in the size of the tumor. Pathologically her tumor was 1.1 cm and 3 sentinel nodes were negative. No LV I was seen. Margins were clear. She had this definitive lumpectomy and sentinel node biopsies on 04/13/2010, went on to receive radiation therapy, started tamoxifen on 07/05/2010 consisting of 20 mg once a day until she became postmenopausal at which time we switched to Aromasin beginning on 08/19/3014.  BCI testing completed in May 2016 demonstrates a low risk of recurrence and a high likelihood of benefit from extended endocrine therapy.    Labs today as ordered: CBC diff, CMET.    Labs in 3 months for possible prolia: calcium  Continue annual mammography.  We reviewed the results of BCI testing.  Oncology history if updated.  Return in 3 months for follow-up.  She is to call us with continued or progressive arthralgias as this may be from AI treatment.  I have provided an Rx for Ibuprofen 800 mg BID PRN.  When she returns in 3 months, we will need to evaluate for resolution of arthralgias and if so, then we may need to consider other osteopenia interventions.  If not resolved by November, we will need to consider side effects from AI treatment.

## 2014-12-30 NOTE — Assessment & Plan Note (Addendum)
Bone density in April 2016 demonstrated osteopenia at her baseline exam.  She is currently on Ca++ and Vit D daily and Prolia every 6 months beginning in May 2016.    She notes increased arthralgias since her Prolia injection in May 2016, however, she also started AI treatment in March 2016.  No sure yet which is causing her arthralgias.  Ibuprofen 800 mg BID PRN escribed.    If arthralgias not resolved by November, then we need to consider AI treatment at the cause of arthralgias.  If better, then Prolia may be the culprit and we can consider Reclast therapy as an alternative option.  If arthralgias progress, then we need to consider AI treatment at the cause and address accordingly.  She will contact us.

## 2014-12-30 NOTE — Progress Notes (Signed)
LABS DRAWN

## 2014-12-30 NOTE — Patient Instructions (Addendum)
[place] at [facility] Discharge Instructions  RECOMMENDATIONS MADE BY THE CONSULTANT AND ANY TEST RESULTS WILL BE SENT TO YOUR REFERRING PHYSICIAN.  Lab work today is good. Exam and discussion with Kirby Crigler, PA-C Return in 3 months for lab work and office visit. Please call the clinic if your joint pain progresses or persists. Ibuprofen 800 mg twice a day as needed (prescription sent to your pharmacy).   Thank you for choosing [place] at [facility] to provide your oncology and hematology care.  To afford each patient quality time with our provider, please arrive at least 15 minutes before your scheduled appointment time.    You need to re-schedule your appointment should you arrive 10 or more minutes late.  We strive to give you quality time with our providers, and arriving late affects you and other patients whose appointments are after yours.  Also, if you no show three or more times for appointments you may be dismissed from the clinic at the providers discretion.     Again, thank you for choosing [facility].  Our hope is that these requests will decrease the amount of time that you wait before being seen by our physicians.       _____________________________________________________________  Should you have questions after your visit to [facility], please contact our office at [phone] between the hours of 8:30 a.m. and 4:30 p.m.  Voicemails left after 4:30 p.m. will not be returned until the following business day.  For prescription refill requests, have your pharmacy contact our office.

## 2015-02-02 ENCOUNTER — Other Ambulatory Visit (HOSPITAL_COMMUNITY): Payer: Self-pay | Admitting: Oncology

## 2015-03-03 ENCOUNTER — Other Ambulatory Visit (HOSPITAL_COMMUNITY): Payer: Self-pay | Admitting: Hematology & Oncology

## 2015-03-03 DIAGNOSIS — Z09 Encounter for follow-up examination after completed treatment for conditions other than malignant neoplasm: Secondary | ICD-10-CM

## 2015-03-16 ENCOUNTER — Ambulatory Visit (HOSPITAL_COMMUNITY)
Admission: RE | Admit: 2015-03-16 | Discharge: 2015-03-16 | Disposition: A | Discharge status: Home / Self Care | Payer: BLUE CROSS/BLUE SHIELD | Source: Ambulatory Visit | Attending: Hematology & Oncology | Admitting: Hematology & Oncology

## 2015-03-16 DIAGNOSIS — Z853 Personal history of malignant neoplasm of breast: Secondary | ICD-10-CM | POA: Diagnosis not present

## 2015-03-16 DIAGNOSIS — Z09 Encounter for follow-up examination after completed treatment for conditions other than malignant neoplasm: Secondary | ICD-10-CM

## 2015-03-18 ENCOUNTER — Other Ambulatory Visit (HOSPITAL_COMMUNITY): Payer: Self-pay | Admitting: Hematology & Oncology

## 2015-03-31 ENCOUNTER — Encounter (HOSPITAL_COMMUNITY): Payer: BLUE CROSS/BLUE SHIELD | Attending: Hematology & Oncology

## 2015-03-31 DIAGNOSIS — C50511 Malignant neoplasm of lower-outer quadrant of right female breast: Secondary | ICD-10-CM | POA: Diagnosis not present

## 2015-03-31 DIAGNOSIS — C50911 Malignant neoplasm of unspecified site of right female breast: Secondary | ICD-10-CM | POA: Diagnosis not present

## 2015-03-31 DIAGNOSIS — M8580 Other specified disorders of bone density and structure, unspecified site: Secondary | ICD-10-CM

## 2015-03-31 LAB — COMPREHENSIVE METABOLIC PANEL
ALK PHOS: 46 U/L (ref 38–126)
ALT: 30 U/L (ref 14–54)
AST: 25 U/L (ref 15–41)
Albumin: 4.1 g/dL (ref 3.5–5.0)
Anion gap: 9 (ref 5–15)
BUN: 20 mg/dL (ref 6–20)
CALCIUM: 10.2 mg/dL (ref 8.9–10.3)
CO2: 27 mmol/L (ref 22–32)
CREATININE: 0.82 mg/dL (ref 0.44–1.00)
Chloride: 102 mmol/L (ref 101–111)
GFR calc Af Amer: >60 mL/min (ref >60)
GFR calc non Af Amer: >60 mL/min (ref >60)
GLUCOSE: 111 mg/dL — AB (ref 65–99)
Potassium: 4 mmol/L (ref 3.5–5.1)
SODIUM: 138 mmol/L (ref 135–145)
Total Bilirubin: 0.7 mg/dL (ref 0.3–1.2)
Total Protein: 7.8 g/dL (ref 6.5–8.1)

## 2015-03-31 LAB — CBC WITH DIFFERENTIAL/PLATELET
BASOS ABS: 0 10*3/uL (ref 0.0–0.1)
Basophils Relative: 0 %
Eosinophils Absolute: 0.1 10*3/uL (ref 0.0–0.7)
Eosinophils Relative: 1 %
HCT: 39.1 % (ref 36.0–46.0)
HEMOGLOBIN: 13.4 g/dL (ref 12.0–15.0)
LYMPHS ABS: 2.8 10*3/uL (ref 0.7–4.0)
Lymphocytes Relative: 38 %
MCH: 33.9 pg (ref 26.0–34.0)
MCHC: 34.3 g/dL (ref 30.0–36.0)
MCV: 99 fL (ref 78.0–100.0)
Monocytes Absolute: 0.4 10*3/uL (ref 0.1–1.0)
Monocytes Relative: 5 %
Neutro Abs: 4.2 10*3/uL (ref 1.7–7.7)
Neutrophils Relative %: 56 %
Platelets: 269 10*3/uL (ref 150–400)
RBC: 3.95 MIL/uL (ref 3.87–5.11)
RDW: 12.5 % (ref 11.5–15.5)
WBC: 7.6 10*3/uL (ref 4.0–10.5)

## 2015-04-01 ENCOUNTER — Encounter (HOSPITAL_COMMUNITY): Payer: Self-pay | Admitting: Oncology

## 2015-04-01 ENCOUNTER — Encounter (HOSPITAL_BASED_OUTPATIENT_CLINIC_OR_DEPARTMENT_OTHER): Payer: BLUE CROSS/BLUE SHIELD

## 2015-04-01 ENCOUNTER — Ambulatory Visit (HOSPITAL_COMMUNITY): Payer: BLUE CROSS/BLUE SHIELD | Admitting: Hematology & Oncology

## 2015-04-01 ENCOUNTER — Encounter (HOSPITAL_BASED_OUTPATIENT_CLINIC_OR_DEPARTMENT_OTHER): Payer: BLUE CROSS/BLUE SHIELD | Admitting: Oncology

## 2015-04-01 ENCOUNTER — Other Ambulatory Visit (HOSPITAL_COMMUNITY): Payer: BLUE CROSS/BLUE SHIELD

## 2015-04-01 VITALS — BP 135/87 | HR 78 | Temp 97.7°F | Resp 18 | Wt 192.0 lb

## 2015-04-01 DIAGNOSIS — C50911 Malignant neoplasm of unspecified site of right female breast: Secondary | ICD-10-CM

## 2015-04-01 DIAGNOSIS — M8580 Other specified disorders of bone density and structure, unspecified site: Secondary | ICD-10-CM

## 2015-04-01 DIAGNOSIS — M2550 Pain in unspecified joint: Secondary | ICD-10-CM

## 2015-04-01 DIAGNOSIS — Z90710 Acquired absence of both cervix and uterus: Secondary | ICD-10-CM

## 2015-04-01 DIAGNOSIS — R609 Edema, unspecified: Secondary | ICD-10-CM | POA: Diagnosis not present

## 2015-04-01 DIAGNOSIS — Z79899 Other long term (current) drug therapy: Secondary | ICD-10-CM

## 2015-04-01 DIAGNOSIS — Z9079 Acquired absence of other genital organ(s): Secondary | ICD-10-CM

## 2015-04-01 DIAGNOSIS — Z90722 Acquired absence of ovaries, bilateral: Secondary | ICD-10-CM

## 2015-04-01 DIAGNOSIS — R600 Localized edema: Secondary | ICD-10-CM

## 2015-04-01 MED ORDER — FUROSEMIDE 20 MG PO TABS: 20.0000 mg | ORAL_TABLET | Freq: Every day | ORAL | Status: DC | PRN

## 2015-04-01 MED ORDER — DENOSUMAB 60 MG/ML ~~LOC~~ SOLN
60.0000 mg | Freq: Once | SUBCUTANEOUS | Status: AC
  Administered 2015-04-01: 60 mg via SUBCUTANEOUS
  Filled 2015-04-01: qty 1

## 2015-04-01 MED ORDER — SODIUM CHLORIDE 0.9 % IV SOLN: Freq: Once | INTRAVENOUS | Status: DC

## 2015-04-01 NOTE — Progress Notes (Signed)
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  INTERVAL HISTORY: Vanessa Neal 54 y.o. female returns for followup of stage II A. infiltrating ductal carcinoma of the right breast with her tumor greater than 2.5 cm by mammography, 3 cm by MRI, ER 75% PR 97% Ki-67 marker high at 85% HER-2/neu nonamplified on her initial biopsy. Repeat on the primary after definitive surgery showed ER positivity of 98%, PR 57%, HER-2/neu nonamplified, Ki-67 marker 85%. She received dose dense Adriamycin and Cytoxan, followed by weekly Taxol x12 weeks. She had tremendous improvement in the size of the tumor. Pathologically her tumor was 1.1 cm and 3 sentinel nodes were negative. No LV I was seen. Margins were clear. She had this definitive lumpectomy and sentinel node biopsies on 04/13/2010, went on to receive radiation therapy, started tamoxifen on 07/05/2010 consisting of 20 mg once a day until she became postmenopausal at which time we switched to Aromasin beginning on 08/19/3014.  BCI testing completed in May 2016 demonstrates a low risk of recurrence and a high likelihood of benefit from extended endocrine therapy.      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 Pablo Ledger at [facility]   07/05/2010 - 08/18/2014 Chemotherapy Tamoxifen 20 mg daily   08/19/2014 -  Anti-estrogen oral therapy Aromasin daily   09/29/2014 Survivorship Prolia started for osteopenia in the setting of Aromatase inhibitor.   10/08/2014 Pathology Results BCI- low risk of late recurrence (2.7% btweenyrs 5-10), high likelihood of benefit from extended endocrine therapy, high relative risk reduction (16.5% absolute benefit), low risk of overall recurrence (4.8% btween yrs 0-10), and high benefit from EET.    I personally reviewed and went over laboratory results with the patient.  The results are noted within this dictation.  Updated labs are WNL.  Her biggest complaint is arthralgias that are worse in the AM and improve as the day goes on.  She notes that it is located in her wrist, fingers, and knees.  She uses prescription strength Ibuprofen that is helpful.  In the past, she was concerned that her discomfort was related t Prolia.  She is due for her next injection today.  She notes that her arthralgias are no better as she gets closer to her second dose of Prolia.  She also, unbeknownst to me until now, held her Aromasin since Saturday (10/29).  Today is 11/3 and she notes no change in her symptoms.  As a result, she reports that Aromasin is not likely contributing to her arthralgias either.  She notes intermittent LE edema.  She reports that Dr. Tressie Stalker put her on HCTZ in the past for this.  She requests I restart this medication.  In lieu of HCTZ, I discussed the option of providing low-dose Lasix PRN.  She is agreeable to this plan of action.  I personally reviewed and went over radiographic studies with the patient.  The results  are noted within this dictation.  Mammogram is BIRADS 2.   She notes that she has an upcoming annual physical in 1-2 weeks with Peter Garter.  She is otherwise doing well.   Past Medical History  Diagnosis Date  . Breast cancer (HCC)   . Ovarian cyst   . 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 BRCA  negative 02/14/2013  . Right thyroid nodule   . 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     has No Known Allergies.  [name] does not currently have medications on file.  Past Surgical History  Procedure Laterality Date  . Cholecystectomy    . Cesarean section      X 2  . Lipoma excision      from back  . Colonoscopy  03/11/2012    Procedure: COLONOSCOPY;  Surgeon: West Bali, MD;  Location: [facility];  Service: Endoscopy;  Laterality: N/A;  9:30 AM-changed to 9:50 Doris notified  . Breast lumpectomy      right  . Breast cyst excision    . Breast cyst aspiration  01/2012  . Mastectomy Right   . Abdominal hysterectomy N/A 06/16/2014    Procedure: HYSTERECTOMY ABDOMINAL;  Surgeon: Tilda Burrow, MD;  Location: [facility] ORS;  Service: Gynecology;  Laterality: N/A;  . Salpingoophorectomy Bilateral 06/16/2014    Procedure: BILATERAL SALPINGO OOPHORECTOMY;  Surgeon: Tilda Burrow, MD;  Location: [facility] OR[facility];  Service: Gynecology;  Laterality: Bilateral;  . Scar revision N/A 06/16/2014    Procedure: SCAR REVISION;  Surgeon: Tilda Burrow, MD;  Location: [facility] OR[facility];  Service: Gynecology;  Laterality: N/A;    Denies any headaches, dizziness, double vision, fevers, chills, night sweats, nausea, vomiting, diarrhea, constipation, chest pain, heart palpitations, shortness of breath, blood in stool, black tarry stool, urinary pain, urinary burning, urinary frequency, hematuria.   PHYSICAL EXAMINATION  ECOG PERFORMANCE STATUS: 0 - Asymptomatic  Filed Vitals:   04/01/15 0956  BP: 135/87  Pulse: 78  Temp: 97.7 F (36.5 C)  Resp: 18    GENERAL:alert, no distress, well nourished, well developed, comfortable,  cooperative and smiling.  She is accompanied by her husband. SKIN: skin color, texture, turgor are normal, no rashes or significant lesions HEAD: Normocephalic, No masses, lesions, tenderness or abnormalities EYES: normal, PERRLA, EOMI, Conjunctiva are pink and non-injected EARS: External ears normal OROPHARYNX:lips, buccal mucosa, and tongue normal and mucous membranes are moist  NECK: supple, no adenopathy, trachea midline LYMPH:  no palpable lymphadenopathy BREAST:not examined LUNGS: CTA B/L HEART: RRR without murmur ABDOMEN:+ BS x 4, nontender. BACK: Back symmetric, no curvature. EXTREMITIES:less then 2 second capillary refill, no joint deformities, effusion, or inflammation, no skin discoloration  NEURO: alert & oriented x 3 with fluent speech, no focal motor/sensory deficits, gait normal   LABORATORY DATA: CBC    Component Value Date/Time   WBC 7.6 03/31/2015 0904   RBC 3.95 03/31/2015 0904   HGB 13.4 03/31/2015 0904   HCT 39.1 03/31/2015 0904   PLT 269 03/31/2015 0904   MCV 99.0 03/31/2015 0904   MCH 33.9 03/31/2015 0904   MCHC 34.3 03/31/2015 0904   RDW 12.5 03/31/2015 0904   LYMPHSABS 2.8 03/31/2015 0904   MONOABS 0.4 03/31/2015 0904  EOSABS 0.1 03/31/2015 0904   BASOSABS 0.0 03/31/2015 0904      Chemistry      Component Value Date/Time   NA 138 03/31/2015 0904   K 4.0 03/31/2015 0904   CL 102 03/31/2015 0904   CO2 27 03/31/2015 0904   BUN 20 03/31/2015 0904   CREATININE 0.82 03/31/2015 0904   CREATININE 0.76 04/29/2014 0919      Component Value Date/Time   CALCIUM 10.2 03/31/2015 0904   ALKPHOS 46 03/31/2015 0904   AST 25 03/31/2015 0904   ALT 30 03/31/2015 0904   BILITOT 0.7 03/31/2015 0904       RADIOGRAPHIC STUDIES:  Mm Diag Breast Tomo Bilateral  03/16/2015  CLINICAL DATA:  History of right breast cancer, diagnosed in 2011. Annual exam. The patient is asymptomatic. EXAM: DIGITAL DIAGNOSTIC BILATERAL MAMMOGRAM WITH 3D TOMOSYNTHESIS AND CAD  COMPARISON:  Previous exam(s). ACR Breast Density Category c: The breast tissue is heterogeneously dense, which may obscure small masses. FINDINGS: There are stable postsurgical changes in the outer right breast. No mass, nonsurgical distortion, or suspicious microcalcification is identified in either breast to suggest malignancy. Mammographic images were processed with CAD. IMPRESSION: Lumpectomy changes on the right. No evidence of malignancy in either breast. RECOMMENDATION: Diagnostic mammogram is suggested in 1 year. (Code:[id number]-01Y) I have discussed the findings and recommendations with the patient. Results were also provided in writing at the conclusion of the visit. If applicable, a reminder letter will be sent to the patient regarding the next appointment. BI-RADS CATEGORY  2: Benign. Electronically Signed   By: Curlene Dolphin M.D.   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 infiltrating ductal carcinoma of the right breast with her tumor greater than 2.5 cm by mammography, 3 cm by MRI, ER 75% PR 97% Ki-67 marker high at 85% HER-2/neu nonamplified on her initial biopsy. Repeat on the primary after definitive surgery showed ER positivity of 98%, PR 57%, HER-2/neu nonamplified, Ki-67 marker 85%. She received dose dense Adriamycin and Cytoxan, followed by weekly Taxol x12 weeks. She had tremendous improvement in the size of the tumor. Pathologically her tumor was 1.1 cm and 3 sentinel nodes were negative. No LV I was seen. Margins were clear. She had this definitive lumpectomy and sentinel node biopsies on 04/13/2010, went on to receive radiation therapy, started tamoxifen on 07/05/2010 consisting of 20 mg once a day until she became postmenopausal at which time we switched to Aromasin beginning on 08/19/3014.  BCI testing completed in May 2016 demonstrates a low risk of recurrence and a high likelihood of benefit from extended endocrine  therapy.    Updated labs are WNL.  She is good for Prolia injection today.  Labs in 6 months: CBC diff, CMET  Continue annual mammography.  Rx for Lasix #30 daily PRN with 1 refill escribed for LE edema.  Continue Aromasin daily as planned.  She did her own trial of holding the medication for about 5 days to see if this helped her arthralgias.  She denies any improvement.  Return in 6 months for follow-up  Osteopenia determined by x-ray Bone density in April 2016 demonstrated osteopenia at her baseline exam.  She is currently on Ca++ and Vit D daily and Prolia every 6 months beginning in May 2016.    She notes increased arthralgias since her Prolia injection in May 2016, beginning approximately 2 months post-injection.  Today she notes no  significant improvement in this despite being closer to her next scheduled injection.  Her symptoms are indicative of osteoarthritis.  She has ibuprofen 800 mg BID PRN at home as needed with good response.    Labs meet treatment parameters today for Prolia.    THERAPY PLAN:  NCCN guidelines recommends the following surveillance for invasive breast cancer:  A. History and Physical exam every 4-6 months for 5 years and then every 12 months.  B. Mammography every 12 months  C. Women on Tamoxifen: annual gynecologic assessment every 12 months if uterus is present.  D. Women on aromatase inhibitor or who experience ovarian failure secondary to treatment should have monitoring of bone health with a bone mineral density determination at baseline and periodically thereafter.  E. Assess and encourage adherence to adjuvant endocrine therapy.  F. Evidence suggests that active lifestyle and achieving and maintaining an ideal body weight (20-25 BMI) may lead to optimal breast cancer outcomes.   All questions were answered. The patient knows to call the clinic with any problems, questions or concerns. We can certainly see the patient much sooner if  necessary.  Patient and plan discussed with Dr. Ancil Linsey and she is in agreement with the aforementioned.   This note is electronically signed by: Robynn Pane 04/01/2015 12:12 PM

## 2015-04-01 NOTE — Progress Notes (Signed)
..  Vanessa Neal presents today for injection per the provider's orders.  prolia administration without incident; see MAR for injection details.  Patient tolerated procedure well and without incident.  No questions or complaints noted at this time.

## 2015-04-01 NOTE — Progress Notes (Signed)
See office visit encounter. 

## 2015-04-01 NOTE — Assessment & Plan Note (Addendum)
Bone density in April 2016 demonstrated osteopenia at her baseline exam.  She is currently on Ca++ and Vit D daily and Prolia every 6 months beginning in May 2016.    She notes increased arthralgias since her Prolia injection in May 2016, beginning approximately 2 months post-injection.  Today she notes no significant improvement in this despite being closer to her next scheduled injection.  Her symptoms are indicative of osteoarthritis.  She has ibuprofen 800 mg BID PRN at home as needed with good response.    Labs meet treatment parameters today for Prolia.

## 2015-04-01 NOTE — Assessment & Plan Note (Addendum)
Stage II A. infiltrating ductal carcinoma of the right breast with her tumor greater than 2.5 cm by mammography, 3 cm by MRI, ER 75% PR 97% Ki-67 marker high at 85% HER-2/neu nonamplified on her initial biopsy. Repeat on the primary after definitive surgery showed ER positivity of 98%, PR 57%, HER-2/neu nonamplified, Ki-67 marker 85%. She received dose dense Adriamycin and Cytoxan, followed by weekly Taxol x12 weeks. She had tremendous improvement in the size of the tumor. Pathologically her tumor was 1.1 cm and 3 sentinel nodes were negative. No LV I was seen. Margins were clear. She had this definitive lumpectomy and sentinel node biopsies on 04/13/2010, went on to receive radiation therapy, started tamoxifen on 07/05/2010 consisting of 20 mg once a day until she became postmenopausal at which time we switched to Aromasin beginning on 08/19/3014.  BCI testing completed in May 2016 demonstrates a low risk of recurrence and a high likelihood of benefit from extended endocrine therapy.    Updated labs are WNL.  She is good for Prolia injection today.  Labs in 6 months: CBC diff, CMET  Continue annual mammography.  Rx for Lasix #30 daily PRN with 1 refill escribed for LE edema.  Continue Aromasin daily as planned.  She did her own trial of holding the medication for about 5 days to see if this helped her arthralgias.  She denies any improvement.  Return in 6 months for follow-up

## 2015-04-01 NOTE — Patient Instructions (Signed)
..  [place] at [facility] Discharge Instructions  RECOMMENDATIONS MADE BY THE CONSULTANT AND ANY TEST RESULTS WILL BE SENT TO YOUR REFERRING PHYSICIAN.  Presciption for lasix sent to your drugstore Prolia today Labs 6 months Return in 6 months to see Dr. Whitney Muse  Thank you for choosing [place] at [facility] to provide your oncology and hematology care.  To afford each patient quality time with our provider, please arrive at least 15 minutes before your scheduled appointment time.    You need to re-schedule your appointment should you arrive 10 or more minutes late.  We strive to give you quality time with our providers, and arriving late affects you and other patients whose appointments are after yours.  Also, if you no show three or more times for appointments you may be dismissed from the clinic at the providers discretion.     Again, thank you for choosing [facility].  Our hope is that these requests will decrease the amount of time that you wait before being seen by our physicians.       _____________________________________________________________  Should you have questions after your visit to [facility], please contact our office at [phone] between the hours of 8:30 a.m. and 4:30 p.m.  Voicemails left after 4:30 p.m. will not be returned until the following business day.  For prescription refill requests, have your pharmacy contact our office.

## 2015-04-02 NOTE — Progress Notes (Signed)
LABS DRAWN

## 2015-04-26 ENCOUNTER — Other Ambulatory Visit: Payer: BLUE CROSS/BLUE SHIELD | Admitting: Adult Health

## 2015-04-29 ENCOUNTER — Other Ambulatory Visit: Payer: BLUE CROSS/BLUE SHIELD | Admitting: Adult Health

## 2015-05-03 ENCOUNTER — Other Ambulatory Visit (HOSPITAL_COMMUNITY): Payer: Self-pay | Admitting: Oncology

## 2015-05-05 ENCOUNTER — Encounter: Payer: BLUE CROSS/BLUE SHIELD | Admitting: Adult Health

## 2015-05-06 NOTE — Progress Notes (Signed)
This encounter was created in error - please disregard.

## 2015-05-14 ENCOUNTER — Other Ambulatory Visit (HOSPITAL_COMMUNITY): Payer: Self-pay | Admitting: Hematology & Oncology

## 2015-05-25 ENCOUNTER — Other Ambulatory Visit (HOSPITAL_COMMUNITY): Payer: Self-pay | Admitting: Oncology

## 2015-06-11 ENCOUNTER — Other Ambulatory Visit: Payer: BLUE CROSS/BLUE SHIELD | Admitting: Adult Health

## 2015-06-15 ENCOUNTER — Other Ambulatory Visit (HOSPITAL_COMMUNITY): Payer: Self-pay | Admitting: Hematology & Oncology

## 2015-06-18 ENCOUNTER — Other Ambulatory Visit (HOSPITAL_COMMUNITY): Payer: Self-pay | Admitting: Emergency Medicine

## 2015-06-18 MED ORDER — EXEMESTANE 25 MG PO TABS: 25.0000 mg | ORAL_TABLET | Freq: Every day | ORAL | Status: DC

## 2015-07-02 ENCOUNTER — Other Ambulatory Visit (HOSPITAL_COMMUNITY): Payer: BLUE CROSS/BLUE SHIELD

## 2015-07-02 ENCOUNTER — Ambulatory Visit (HOSPITAL_COMMUNITY): Payer: BLUE CROSS/BLUE SHIELD | Admitting: Hematology & Oncology

## 2015-07-17 ENCOUNTER — Other Ambulatory Visit (HOSPITAL_COMMUNITY): Payer: Self-pay | Admitting: Oncology

## 2015-08-12 ENCOUNTER — Other Ambulatory Visit (HOSPITAL_COMMUNITY): Payer: Self-pay | Admitting: Oncology

## 2015-09-22 ENCOUNTER — Other Ambulatory Visit (HOSPITAL_COMMUNITY): Payer: Self-pay | Admitting: Emergency Medicine

## 2015-09-22 DIAGNOSIS — C50911 Malignant neoplasm of unspecified site of right female breast: Secondary | ICD-10-CM

## 2015-09-22 DIAGNOSIS — M8580 Other specified disorders of bone density and structure, unspecified site: Secondary | ICD-10-CM

## 2015-09-27 ENCOUNTER — Other Ambulatory Visit (HOSPITAL_COMMUNITY): Payer: BLUE CROSS/BLUE SHIELD

## 2015-09-27 ENCOUNTER — Encounter (HOSPITAL_COMMUNITY): Payer: BLUE CROSS/BLUE SHIELD | Attending: Hematology & Oncology

## 2015-09-27 DIAGNOSIS — N83209 Unspecified ovarian cyst, unspecified side: Secondary | ICD-10-CM | POA: Insufficient documentation

## 2015-09-27 DIAGNOSIS — Z923 Personal history of irradiation: Secondary | ICD-10-CM | POA: Diagnosis not present

## 2015-09-27 DIAGNOSIS — C50911 Malignant neoplasm of unspecified site of right female breast: Secondary | ICD-10-CM | POA: Insufficient documentation

## 2015-09-27 DIAGNOSIS — Z9889 Other specified postprocedural states: Secondary | ICD-10-CM | POA: Insufficient documentation

## 2015-09-27 DIAGNOSIS — M8580 Other specified disorders of bone density and structure, unspecified site: Secondary | ICD-10-CM | POA: Diagnosis not present

## 2015-09-27 DIAGNOSIS — Z9049 Acquired absence of other specified parts of digestive tract: Secondary | ICD-10-CM | POA: Insufficient documentation

## 2015-09-27 DIAGNOSIS — Z90710 Acquired absence of both cervix and uterus: Secondary | ICD-10-CM | POA: Diagnosis not present

## 2015-09-27 DIAGNOSIS — E041 Nontoxic single thyroid nodule: Secondary | ICD-10-CM | POA: Diagnosis not present

## 2015-09-27 LAB — CBC WITH DIFFERENTIAL/PLATELET
BASOS PCT: 0 %
Basophils Absolute: 0 10*3/uL (ref 0.0–0.1)
EOS ABS: 0.1 10*3/uL (ref 0.0–0.7)
Eosinophils Relative: 2 %
HEMATOCRIT: 40.1 % (ref 36.0–46.0)
Hemoglobin: 13.7 g/dL (ref 12.0–15.0)
Lymphocytes Relative: 44 %
Lymphs Abs: 3 10*3/uL (ref 0.7–4.0)
MCH: 33.9 pg (ref 26.0–34.0)
MCHC: 34.2 g/dL (ref 30.0–36.0)
MCV: 99.3 fL (ref 78.0–100.0)
MONOS PCT: 6 %
Monocytes Absolute: 0.4 10*3/uL (ref 0.1–1.0)
Neutro Abs: 3.3 10*3/uL (ref 1.7–7.7)
Neutrophils Relative %: 48 %
Platelets: 266 10*3/uL (ref 150–400)
RBC: 4.04 MIL/uL (ref 3.87–5.11)
RDW: 13.1 % (ref 11.5–15.5)
WBC: 6.8 10*3/uL (ref 4.0–10.5)

## 2015-09-27 LAB — COMPREHENSIVE METABOLIC PANEL
ALBUMIN: 4.2 g/dL (ref 3.5–5.0)
ALT: 34 U/L (ref 14–54)
ANION GAP: 9 (ref 5–15)
AST: 29 U/L (ref 15–41)
Alkaline Phosphatase: 43 U/L (ref 38–126)
BILIRUBIN TOTAL: 0.8 mg/dL (ref 0.3–1.2)
BUN: 16 mg/dL (ref 6–20)
CO2: 28 mmol/L (ref 22–32)
Calcium: 10.4 mg/dL — ABNORMAL HIGH (ref 8.9–10.3)
Chloride: 101 mmol/L (ref 101–111)
Creatinine, Ser: 0.82 mg/dL (ref 0.44–1.00)
GFR calc non Af Amer: >60 mL/min (ref >60)
GLUCOSE: 146 mg/dL — AB (ref 65–99)
POTASSIUM: 3.9 mmol/L (ref 3.5–5.1)
SODIUM: 138 mmol/L (ref 135–145)
TOTAL PROTEIN: 8 g/dL (ref 6.5–8.1)

## 2015-09-29 ENCOUNTER — Encounter (HOSPITAL_COMMUNITY): Payer: Self-pay | Admitting: Hematology & Oncology

## 2015-09-29 ENCOUNTER — Encounter (HOSPITAL_BASED_OUTPATIENT_CLINIC_OR_DEPARTMENT_OTHER): Payer: BLUE CROSS/BLUE SHIELD | Admitting: Hematology & Oncology

## 2015-09-29 ENCOUNTER — Encounter (HOSPITAL_COMMUNITY): Payer: BLUE CROSS/BLUE SHIELD

## 2015-09-29 ENCOUNTER — Ambulatory Visit (HOSPITAL_COMMUNITY): Payer: BLUE CROSS/BLUE SHIELD | Admitting: Hematology & Oncology

## 2015-09-29 VITALS — BP 122/83 | HR 81 | Temp 98.6°F | Resp 16

## 2015-09-29 DIAGNOSIS — Z90722 Acquired absence of ovaries, bilateral: Secondary | ICD-10-CM

## 2015-09-29 DIAGNOSIS — Z79899 Other long term (current) drug therapy: Secondary | ICD-10-CM

## 2015-09-29 DIAGNOSIS — Z9079 Acquired absence of other genital organ(s): Secondary | ICD-10-CM

## 2015-09-29 DIAGNOSIS — Z79811 Long term (current) use of aromatase inhibitors: Secondary | ICD-10-CM

## 2015-09-29 DIAGNOSIS — C50911 Malignant neoplasm of unspecified site of right female breast: Secondary | ICD-10-CM

## 2015-09-29 DIAGNOSIS — M8580 Other specified disorders of bone density and structure, unspecified site: Secondary | ICD-10-CM

## 2015-09-29 DIAGNOSIS — Z170 Estrogen receptor positive status [ER+]: Secondary | ICD-10-CM

## 2015-09-29 DIAGNOSIS — Z90710 Acquired absence of both cervix and uterus: Secondary | ICD-10-CM

## 2015-09-29 MED ORDER — DENOSUMAB 60 MG/ML ~~LOC~~ SOLN
60.0000 mg | Freq: Once | SUBCUTANEOUS | Status: AC
  Administered 2015-09-29: 60 mg via SUBCUTANEOUS
  Filled 2015-09-29: qty 1

## 2015-09-29 NOTE — Progress Notes (Signed)
Vanessa Neal's reason for visit today is for an injection as scheduled per MD orders. Vanessa Neal also received prolia per MD orders; see [facility] for administration details.  Vanessa Neal tolerated all procedures well and without incident; questions were answered and patient was discharged.

## 2015-09-29 NOTE — Progress Notes (Signed)
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 Repeat on the primary after definitive surgery showed ER positivity of 98%, PR 57%, HER-2/neu nonamplified, Ki-67 marker 85%. She received dose dense Adriamycin and Cytoxan, followed by weekly Taxol x12 weeks. She had tremendous improvement in the size of the tumor. Pathologically her tumor was 1.1 cm and 3 sentinel nodes were negative. No LV I was seen. Margins were clear. She had this definitive lumpectomy and sentinel node biopsies on 04/13/2010, went on to receive radiation therapy, started tamoxifen on 07/05/2010 consisting of 20 mg once a day. She underwent a hysterectomy in early 2016 and has been on aromasin since. She has completed 5 years of endocrine therapy. BCI showed no benefit from extended endocrine therapy.  Vanessa Neal is here alone.   Reports in the mornings she is hardly able to shape her fist due to pain and stiffness in her hands and runs it under hot water to alleviate this stiffness. She notes her ankles become stiff as well, attributing it to standing most of the day. She is not sure if the medication she is taking would be causing this. She notes knee pain and overall joint pain.   Her appetite is good. She plans on losing about 20 lbs. She continues to perform self breast examinations. She notes that her energy is fairly good. No other major  complaints or concerns today.   Past Medical History  Diagnosis Date  . Breast cancer ([place])   . Ovarian cyst   . 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 BRCA negative 02/14/2013  . Right thyroid nodule   . 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     has No Known Allergies.  We administered denosumab.  Past Surgical History  Procedure Laterality Date  . Cholecystectomy    . Cesarean section      X 2  . Lipoma excision      from back  . Colonoscopy  03/11/2012    Procedure: COLONOSCOPY;  Surgeon: Danie Binder, MD;  Location: [facility];  Service: Endoscopy;  Laterality: N/A;  9:30 AM-changed to 9:50 Doris notified  . Breast lumpectomy      right  . Breast cyst excision    . Breast cyst aspiration  01/2012  . Mastectomy Right   . Abdominal hysterectomy N/A 06/16/2014    Procedure: HYSTERECTOMY ABDOMINAL;  Surgeon: Jonnie Kind, MD;  Location: [facility]S;  Service: Gynecology;  Laterality: N/A;  . Salpingoophorectomy Bilateral 06/16/2014    Procedure: BILATERAL SALPINGO OOPHORECTOMY;  Surgeon: Jonnie Kind, MD;  Location: [facility];  Service: Gynecology;  Laterality: Bilateral;  . Scar revision N/A 06/16/2014    Procedure: SCAR REVISION;  Surgeon: Jonnie Kind, MD;  Location: [facility];  Service: Gynecology;  Laterality: N/A;    Denies any headaches, dizziness, double vision, fevers, chills, night sweats, nausea, vomiting, diarrhea, chest pain, heart palpitations, shortness of breath, black tarry stool, urinary pain, urinary burning, urinary frequency, hematuria. Positive for joint pain. 14 point review of systems was performed and is negative except as detailed under  history of present illness and above   PHYSICAL EXAMINATION  ECOG PERFORMANCE STATUS: 0 - Asymptomatic  BP 122/83 mmHg  Pulse 81  Temp(Src) 98.6 F (37 C) (Oral)  Resp 16  GENERAL:alert, healthy, no distress, well nourished, well developed, comfortable, cooperative and smiling SKIN: skin color, texture, turgor are normal, no rashes or significant lesions HEAD: Normocephalic, No masses, lesions, tenderness or abnormalities EYES: normal, PERRLA, EOMI, Conjunctiva are pink and non-injected EARS: External ears normal OROPHARYNX:mucous membranes are moist  NECK: supple, no adenopathy, thyroid normal size, non-tender, without nodularity, no stridor, non-tender, trachea midline LYMPH:  no palpable lymphadenopathy, no hepatosplenomegaly BREAST:breasts appear normal, no suspicious masses, no skin or nipple changes or axillary nodes, lumpectomy scar well healed on right. Scar tissue noted in the R axillae LUNGS: clear to auscultation and percussion HEART: regular rate & rhythm, no murmurs, no gallops, S1 normal and S2 normal ABDOMEN:abdomen soft, non-tender, normal bowel sounds, no masses or organomegaly and no hepatosplenomegaly BACK: Back symmetric, no curvature., No CVA tenderness EXTREMITIES:less then 2 second capillary refill, no joint deformities, effusion, or inflammation, no edema, no skin discoloration, no clubbing, no cyanosis  NEURO: alert & oriented x 3 with fluent speech, no focal motor/sensory deficits, gait normal   LABORATORY DATA: I have reviewed the data as listed. CBC    Component Value Date/Time   WBC 6.8 09/27/2015 0816   RBC 4.04 09/27/2015 0816   HGB 13.7 09/27/2015 0816   HCT 40.1 09/27/2015 0816   PLT 266 09/27/2015 0816   MCV 99.3 09/27/2015 0816   MCH 33.9 09/27/2015 0816   MCHC 34.2 09/27/2015 0816   RDW 13.1 09/27/2015 0816   LYMPHSABS 3.0 09/27/2015 0816   MONOABS 0.4 09/27/2015 0816   EOSABS 0.1 09/27/2015 0816   BASOSABS 0.0 09/27/2015 0816       Chemistry      Component Value Date/Time   NA 138 09/27/2015 0816   K 3.9 09/27/2015 0816   CL 101 09/27/2015 0816   CO2 28 09/27/2015 0816   BUN 16 09/27/2015 0816   CREATININE 0.82 09/27/2015 0816   CREATININE 0.76 04/29/2014 0919      Component Value Date/Time   CALCIUM 10.4* 09/27/2015 0816   ALKPHOS 43 09/27/2015 0816   AST 29 09/27/2015 0816   ALT 34 09/27/2015 0816   BILITOT 0.8 09/27/2015 0816       ASSESSMENT:  1. Stage II A. infiltrating ductal carcinoma of the right breast with her tumor greater than 2.5 cm by mammography, 3 cm by MRI, ER 75% PR 97% Ki-67 marker high at 85% HER-2/neu nonamplified on her initial biopsy. Repeat on the primary after definitive surgery showed ER positivity of 98%, PR 57%, HER-2/neu nonamplified, Ki-67 marker 85%. She received dose dense Adriamycin and Cytoxan, followed by weekly Taxol x12 weeks. She had tremendous improvement in the size of the tumor. Pathologically her tumor was 1.1 cm and 3 sentinel nodes were negative. No LV I was seen. Margins were clear. She had this definitive lumpectomy and  sentinel node biopsies on 04/13/2010, went on to receive radiation therapy, started tamoxifen on 07/05/2010 consisting of 20 mg once a day.  Changed to aromasin 07/2014 after hysterectomy. 2. BRCA1 or BRCA2 negative 3. History of thyroid goiter 4. S/P TAH/BSO 5. Osteopenia, on prolia, calcium and vitamin D 6. Hypercalcemia, mild  Patient Active Problem List   Diagnosis Date Noted  . Osteopenia determined by x-ray 09/25/2014  . High risk medication use 09/25/2014  . S/P total hysterectomy and bilateral salpingo-oophorectomy 06/16/2014  . Fecal occult blood test positive 04/29/2014  . Right thyroid nodule 04/21/2013  . BRCA negative 02/14/2013  . 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 I suspect her AI may be contributing. She notes she had  stopped it for about 4 days with no improvement, however I advised her that it would take longer than that to notice improvement if the drug was the cause of her symptoms.  She has completed 5 years of endocrine therapy. BCI shows no benefit from prolonged endocrine therapy.  She was diagnosed in the premenopausal setting and therefore could benefit from Tamoxifen for 10 years. We discussed all these options. She wishes at this point to discontinue additional therapy.   The patient will receive her last Prolia today. We will recheck a calcium level in 4 weeks if still elevated will address at that time.   She will continue to take calcium and Vitamin D supplements for now.   Her next bone density scan will be due next year. She is due for her next mammogram in October - I have put in this order.   I have encouraged the patient to begin weight bearing exercise.   I will see her again in 6 months for follow up. At this next visit, if she is doing well we will space our visits out to once yearly.  All questions were answered. The patient knows to call the clinic with any problems, questions or concerns. We can certainly see the patient much sooner if necessary.  This document serves as a record of services personally performed by Ancil Linsey, MD. It was created on her behalf by Arlyce Harman, a trained medical scribe. The creation of this record is based on the scribe's personal observations and the provider's statements to them. This document has been checked and approved by the attending provider.  I have reviewed the above documentation for accuracy and completeness, and I agree with the above.  This note was signed electronically.  Molli Hazard, MD

## 2015-09-29 NOTE — Patient Instructions (Addendum)
Vanessa Neal at [facility] Discharge Instructions  RECOMMENDATIONS MADE BY THE CONSULTANT AND ANY TEST RESULTS WILL BE SENT TO YOUR REFERRING PHYSICIAN.   Exam and discussion by Dr Whitney Muse today You can stop taking your aromasin.  You have finished 5 years This will be your last prolia injection.  But continue taking calcium with vitamin d. Try doing weight bearing activity every day Return to see the doctor in 6 months Mammogram in october Please call the clinic if you have any questions or concerns     Thank you for choosing [place] at [facility] to provide your oncology and hematology care.  To afford each patient quality time with our provider, please arrive at least 15 minutes before your scheduled appointment time.   Beginning January 23rd 2017 lab work for the [facility] will be done in the  Main lab at [facility] on 1st floor. If you have a lab appointment with the [facility] please come in thru the  Main Entrance and check in at the main information desk  You need to re-schedule your appointment should you arrive 10 or more minutes late.  We strive to give you quality time with our providers, and arriving late affects you and other patients whose appointments are after yours.  Also, if you no show three or more times for appointments you may be dismissed from the clinic at the providers discretion.     Again, thank you for choosing [facility].  Our hope is that these requests will decrease the amount of time that you wait before being seen by our physicians.       _____________________________________________________________  Should you have questions after your visit to [facility], please contact our office at [phone] between the hours of 8:30 a.m. and 4:30 p.m.  Voicemails left after 4:30 p.m. will not be returned until the following business day.  For prescription refill requests, have  your pharmacy contact our office.         Resources For Cancer Patients and their Caregivers ? American Cancer Society: Can assist with transportation, wigs, general needs, runs Look Good Feel Better.        423-029-5628 ? Cancer Care: Provides financial assistance, online support groups, medication/co-pay assistance.  1-800-813-HOPE 478-856-3414) ? [place] Assists [place] cancer patients and their families through emotional , educational and financial support.  (805) 065-7917 ? [place] DSS Where to apply for food stamps, Medicaid and utility assistance. 320 577 7251 ? [employer]: Transportation to medical appointments. 580-303-4562 ? Social Security Administration: May apply for disability if have a Stage IV cancer. 941-567-7215 909-272-0866 ? [employer], Disability and Transit Services: Assists with nutrition, care and transit needs. [phone]

## 2015-09-30 ENCOUNTER — Other Ambulatory Visit (HOSPITAL_COMMUNITY): Payer: Self-pay | Admitting: Oncology

## 2015-09-30 ENCOUNTER — Other Ambulatory Visit (HOSPITAL_COMMUNITY): Payer: Self-pay | Admitting: Emergency Medicine

## 2015-09-30 DIAGNOSIS — C50911 Malignant neoplasm of unspecified site of right female breast: Secondary | ICD-10-CM

## 2015-10-28 ENCOUNTER — Encounter (HOSPITAL_COMMUNITY): Payer: BLUE CROSS/BLUE SHIELD | Attending: Hematology & Oncology

## 2015-10-28 DIAGNOSIS — N83209 Unspecified ovarian cyst, unspecified side: Secondary | ICD-10-CM | POA: Diagnosis not present

## 2015-10-28 DIAGNOSIS — Z923 Personal history of irradiation: Secondary | ICD-10-CM | POA: Insufficient documentation

## 2015-10-28 DIAGNOSIS — M8580 Other specified disorders of bone density and structure, unspecified site: Secondary | ICD-10-CM | POA: Insufficient documentation

## 2015-10-28 DIAGNOSIS — E041 Nontoxic single thyroid nodule: Secondary | ICD-10-CM | POA: Insufficient documentation

## 2015-10-28 DIAGNOSIS — Z9889 Other specified postprocedural states: Secondary | ICD-10-CM | POA: Insufficient documentation

## 2015-10-28 DIAGNOSIS — Z9049 Acquired absence of other specified parts of digestive tract: Secondary | ICD-10-CM | POA: Insufficient documentation

## 2015-10-28 DIAGNOSIS — C50911 Malignant neoplasm of unspecified site of right female breast: Secondary | ICD-10-CM | POA: Diagnosis not present

## 2015-10-28 DIAGNOSIS — Z90710 Acquired absence of both cervix and uterus: Secondary | ICD-10-CM | POA: Diagnosis not present

## 2015-10-28 LAB — CALCIUM: CALCIUM: 9.5 mg/dL (ref 8.9–10.3)

## 2015-10-29 ENCOUNTER — Other Ambulatory Visit (HOSPITAL_COMMUNITY): Payer: BLUE CROSS/BLUE SHIELD

## 2015-11-01 ENCOUNTER — Other Ambulatory Visit (HOSPITAL_COMMUNITY): Payer: BLUE CROSS/BLUE SHIELD

## 2015-11-19 ENCOUNTER — Encounter: Payer: Self-pay | Admitting: Genetic Counselor

## 2015-12-30 ENCOUNTER — Other Ambulatory Visit (HOSPITAL_COMMUNITY): Payer: Self-pay | Admitting: Oncology

## 2016-03-21 ENCOUNTER — Ambulatory Visit (HOSPITAL_COMMUNITY)
Admission: RE | Admit: 2016-03-21 | Discharge: 2016-03-21 | Disposition: A | Discharge status: Home / Self Care | Payer: BLUE CROSS/BLUE SHIELD | Source: Ambulatory Visit | Attending: Hematology & Oncology | Admitting: Hematology & Oncology

## 2016-03-21 DIAGNOSIS — C50911 Malignant neoplasm of unspecified site of right female breast: Secondary | ICD-10-CM

## 2016-03-21 DIAGNOSIS — R922 Inconclusive mammogram: Secondary | ICD-10-CM | POA: Diagnosis not present

## 2016-03-21 DIAGNOSIS — Z853 Personal history of malignant neoplasm of breast: Secondary | ICD-10-CM | POA: Diagnosis not present

## 2016-03-31 ENCOUNTER — Ambulatory Visit (HOSPITAL_COMMUNITY): Payer: BLUE CROSS/BLUE SHIELD | Admitting: Hematology & Oncology

## 2016-04-04 ENCOUNTER — Encounter (HOSPITAL_COMMUNITY): Payer: BLUE CROSS/BLUE SHIELD | Attending: Hematology & Oncology | Admitting: Hematology & Oncology

## 2016-04-04 ENCOUNTER — Encounter (HOSPITAL_COMMUNITY): Payer: Self-pay | Admitting: Hematology & Oncology

## 2016-04-04 VITALS — BP 142/78 | HR 72 | Temp 97.7°F | Resp 18 | Wt 190.0 lb

## 2016-04-04 DIAGNOSIS — Z853 Personal history of malignant neoplasm of breast: Secondary | ICD-10-CM

## 2016-04-04 DIAGNOSIS — Z9079 Acquired absence of other genital organ(s): Secondary | ICD-10-CM

## 2016-04-04 DIAGNOSIS — C50911 Malignant neoplasm of unspecified site of right female breast: Secondary | ICD-10-CM

## 2016-04-04 DIAGNOSIS — M8580 Other specified disorders of bone density and structure, unspecified site: Secondary | ICD-10-CM | POA: Diagnosis not present

## 2016-04-04 DIAGNOSIS — Z90710 Acquired absence of both cervix and uterus: Secondary | ICD-10-CM

## 2016-04-04 DIAGNOSIS — Z90722 Acquired absence of ovaries, bilateral: Secondary | ICD-10-CM

## 2016-04-04 NOTE — Patient Instructions (Addendum)
[place] at [facility] Discharge Instructions  RECOMMENDATIONS MADE BY THE CONSULTANT AND ANY TEST RESULTS WILL BE SENT TO YOUR REFERRING PHYSICIAN.  Exam and discussion today with Dr. Whitney Muse. Dexa scan in April as scheduled. Mammogram in one year as scheduled. Return to clinic in one year for office visit.  Thank you for choosing [place] at [facility] to provide your oncology and hematology care.  To afford each patient quality time with our provider, please arrive at least 15 minutes before your scheduled appointment time.   Beginning January 23rd 2017 lab work for the [facility] will be done in the  Main lab at [facility] on 1st floor. If you have a lab appointment with the [facility] please come in thru the  Main Entrance and check in at the main information desk  You need to re-schedule your appointment should you arrive 10 or more minutes late.  We strive to give you quality time with our providers, and arriving late affects you and other patients whose appointments are after yours.  Also, if you no show three or more times for appointments you may be dismissed from the clinic at the providers discretion.     Again, thank you for choosing [facility].  Our hope is that these requests will decrease the amount of time that you wait before being seen by our physicians.       _____________________________________________________________  Should you have questions after your visit to [facility], please contact our office at [phone] between the hours of 8:30 a.m. and 4:30 p.m.  Voicemails left after 4:30 p.m. will not be returned until the following business day.  For prescription refill requests, have your pharmacy contact our office.         Resources For Cancer Patients and their Caregivers ? American Cancer Society: Can assist with transportation, wigs, general needs, runs Look Good Feel  Better.        (859)443-3252 ? Cancer Care: Provides financial assistance, online support groups, medication/co-pay assistance.  1-800-813-HOPE 713 705 5533) ? [place] Assists [place] cancer patients and their families through emotional , educational and financial support.  405-715-0629 ? [place] DSS Where to apply for food stamps, Medicaid and utility assistance. 437-293-7776 ? [employer]: Transportation to medical appointments. 269-180-6432 ? Social Security Administration: May apply for disability if have a Stage IV cancer. (361) 596-0423 438-566-3136 ? [employer], Disability and Transit Services: Assists with nutrition, care and transit needs. [place] Support Programs: @10RELATIVEDAYS @ > Cancer Support Group  2nd Tuesday of the month 1pm-2pm, Journey Room  > Creative Journey  3rd Tuesday of the month 1130am-1pm, Journey Room  > Look Good Feel Better  1st Wednesday of the month 10am-12 noon, Journey Room (Call [facility] to register [phone])

## 2016-04-04 NOTE — Assessment & Plan Note (Signed)
Doing well without obvious evidence of recurrence. Completed 5 years of endocrine therapy, BCI showed no benefit to extended therapy.

## 2016-04-04 NOTE — Progress Notes (Signed)
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 Repeat on the primary after definitive surgery showed ER positivity of 98%, PR 57%, HER-2/neu nonamplified, Ki-67 marker 85%. She received dose dense Adriamycin and Cytoxan, followed by weekly Taxol x12 weeks. She had tremendous improvement in the size of the tumor. Pathologically her tumor was 1.1 cm and 3 sentinel nodes were negative. No LV I was seen. Margins were clear. She had this definitive lumpectomy and sentinel node biopsies on 04/13/2010, went on to receive radiation therapy, started tamoxifen on 07/05/2010 consisting of 20 mg once a day. She underwent a hysterectomy in early 2016 and then switched to aromasin. She has completed 5 years of endocrine therapy. BCI showed no benefit from extended endocrine therapy.  Patient has had a mammogram and it was negative.   She is scheduled for physical exam tomorrow with Dr. Glo Herring. She is taking all medications except Effexor but is weaning herself off of it.  Dr. Glo Herring has been following thyroid nodule.   Dasani denies any issues with her breasts.  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  She denies any changes in her PS, appetite is excellent. No new pain.   Past Medical History:  Diagnosis Date  . BRCA negative 02/14/2013  . Breast cancer ([place] [facility])   . Fecal occult blood test positive 04/29/2014  . Infiltrating  ductal carcinoma of right female breast ([place]) 01/04/2011  . Ovarian cyst   . 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     has No Known Allergies.  Ms. Gouge had no medications administered during this visit.  Past Surgical History:  Procedure Laterality Date  . ABDOMINAL HYSTERECTOMY N/A 06/16/2014   Procedure: HYSTERECTOMY ABDOMINAL;  Surgeon: Jonnie Kind, MD;  Location: [facility];  Service: Gynecology;  Laterality: N/A;  . BREAST CYST ASPIRATION  01/2012  . BREAST CYST EXCISION    . BREAST LUMPECTOMY     right  . CESAREAN SECTION     X 2  . CHOLECYSTECTOMY    . COLONOSCOPY  03/11/2012   Procedure: COLONOSCOPY;  Surgeon: Danie Binder, MD;  Location: [facility];  Service: Endoscopy;  Laterality: N/A;  9:30 AM-changed to 9:50 Doris notified  . LIPOMA EXCISION     from back  . MASTECTOMY Right   . SALPINGOOPHORECTOMY Bilateral 06/16/2014   Procedure: BILATERAL SALPINGO OOPHORECTOMY;  Surgeon: Jonnie Kind, MD;  Location: [facility];  Service: Gynecology;  Laterality: Bilateral;  . SCAR REVISION N/A 06/16/2014   Procedure: SCAR REVISION;  Surgeon: Jonnie Kind, MD;  Location: [facility];  Service: Gynecology;  Laterality: N/A;  Denies any headaches, dizziness, double vision, fevers, chills, night sweats, nausea, vomiting, diarrhea, chest pain, heart palpitations, shortness of breath, black tarry stool, urinary pain, urinary burning, urinary frequency, hematuria.  14 point review of systems was performed and is negative except as detailed under history of present illness and above  PHYSICAL EXAMINATION  ECOG PERFORMANCE STATUS: 0 - Asymptomatic  BP (!) 142/78 (BP Location: Left Arm, Patient Position: Sitting)   Pulse 72    Temp 97.7 F (36.5 C) (Oral)   Resp 18   Wt 190 lb (86.2 kg)   SpO2 100%   BMI 32.61 kg/m   GENERAL:alert, healthy, no distress, well nourished, well developed, comfortable, cooperative and smiling SKIN: skin color, texture, turgor are normal, no rashes or significant lesions HEAD: Normocephalic, No masses, lesions, tenderness or abnormalities EYES: normal, PERRLA, EOMI, Conjunctiva are pink and non-injected EARS: External ears normal OROPHARYNX:mucous membranes are moist  NECK: supple, no adenopathy, thyroid normal size, non-tender, without nodularity, no stridor, non-tender, trachea midline LYMPH:  no palpable lymphadenopathy, no hepatosplenomegaly BREAST:breasts appear normal, no suspicious masses, no skin or nipple changes or axillary nodes, lumpectomy scar well healed on right. Scar tissue noted in the R axillae LUNGS: clear to auscultation and percussion HEART: regular rate & rhythm, no murmurs, no gallops, S1 normal and S2 normal ABDOMEN:abdomen soft, non-tender, normal bowel sounds, no masses or organomegaly and no hepatosplenomegaly BACK: Back symmetric, no curvature., No CVA tenderness EXTREMITIES:less then 2 second capillary refill, no joint deformities, effusion, or inflammation, no edema, no skin discoloration, no clubbing, no cyanosis  NEURO: alert & oriented x 3 with fluent speech, no focal motor/sensory deficits, gait normal   LABORATORY DATA: I have reviewed the data as listed. CBC    Component Value Date/Time   WBC 6.8 09/27/2015 0816   RBC 4.04 09/27/2015 0816   HGB 13.7 09/27/2015 0816   HCT 40.1 09/27/2015 0816   PLT 266 09/27/2015 0816   MCV 99.3 09/27/2015 0816   MCH 33.9 09/27/2015 0816   MCHC 34.2 09/27/2015 0816   RDW 13.1 09/27/2015 0816   LYMPHSABS 3.0 09/27/2015 0816   MONOABS 0.4 09/27/2015 0816   EOSABS 0.1 09/27/2015 0816   BASOSABS 0.0 09/27/2015 0816      Chemistry      Component Value Date/Time   NA 138 09/27/2015 0816   K 3.9  09/27/2015 0816   CL 101 09/27/2015 0816   CO2 28 09/27/2015 0816   BUN 16 09/27/2015 0816   CREATININE 0.82 09/27/2015 0816   CREATININE 0.76 04/29/2014 0919      Component Value Date/Time   CALCIUM 9.5 10/28/2015 0822   ALKPHOS 43 09/27/2015 0816   AST 29 09/27/2015 0816   ALT 34 09/27/2015 0816   BILITOT 0.8 09/27/2015 0816       ASSESSMENT:  1. Stage II A. infiltrating ductal carcinoma of the right breast with her tumor greater than 2.5 cm by mammography, 3 cm by MRI, ER 75% PR 97% Ki-67 marker high at 85% HER-2/neu nonamplified on her initial biopsy. Repeat on the primary after definitive surgery showed ER positivity of 98%, PR 57%, HER-2/neu nonamplified, Ki-67 marker 85%. She received dose dense Adriamycin and Cytoxan, followed by weekly Taxol x12 weeks. She had tremendous improvement in the size of the tumor. Pathologically her tumor was 1.1 cm and 3 sentinel nodes were negative. No LV I was seen. Margins were clear. She had this definitive lumpectomy and sentinel node biopsies on 04/13/2010, went on to receive radiation  therapy, started tamoxifen on 07/05/2010 consisting of 20 mg once a day.  Changed to aromasin 07/2014 after hysterectomy. COMPLETED endocrine therapy 2. BRCA1 or BRCA2 negative 3. History of thyroid goiter 4. S/P TAH/BSO 5. Osteopenia, on prolia, calcium and vitamin D 6. Hypercalcemia, mild  Patient Active Problem List   Diagnosis Date Noted  . 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 Osteopenia determined by x-ray 09/25/2014    Priority: Medium  . S/P total hysterectomy and bilateral salpingo-oophorectomy 06/16/2014    Priority: Medium  . BRCA negative 02/14/2013    Priority: Medium  . High risk medication use 09/25/2014  . Fecal occult blood test positive 04/29/2014  . Right thyroid nodule 04/21/2013    PLAN:  She is currently on observation . Doing well. She is up to date with mammography. I have  ordered another diagnostic mammogram for next year.  For her bone she will continue calcium and vitamin D. I have left prolia orders in. She will be due for a repeat DEXA in April. If bone density is improved, will discontinue prolia moving forward. She is comfortable with this plan.  I have ordered a DEXA for April of 2018.   Patient appears well. We can move to yearly visits now.  Follow up next year. Will do breast exam next visit. She is encouraged to keep up with all other well care.   Orders Placed This Encounter  Procedures  . DG Bone Density    Standing Status:   Future    Standing Expiration Date:   04/04/2017    Order Specific Question:   Reason for Exam (SYMPTOM  OR DIAGNOSIS REQUIRED)    Answer:   osteopenia, history high risk medication    Order Specific Question:   Is the patient pregnant?    Answer:   No    Order Specific Question:   Preferred imaging location?    Answer:   [facility]    Standing Status:   Future    Standing Expiration Date:   06/04/2017    Order Specific Question:   Reason for Exam (SYMPTOM  OR DIAGNOSIS REQUIRED)    Answer:   history breast cancer    Order Specific Question:   Is the patient pregnant?    Answer:   No    Order Specific Question:   Preferred imaging location?    Answer:   [facility]     All questions were answered. The patient knows to call the clinic with any problems, questions or concerns. We can certainly see the patient much sooner if necessary.  This document serves as a record of services personally performed by Ancil Linsey, MD. It was created on her behalf by Elmyra Ricks, a trained medical scribe. The creation of this record is based on the scribe's personal observations and the provider's statements to them. This document has been checked and approved by the attending provider.  I have reviewed the above documentation for accuracy and completeness, and I agree with the  above.  This note was signed electronically.  Molli Hazard, MD

## 2016-04-05 ENCOUNTER — Encounter: Payer: Self-pay | Admitting: Adult Health

## 2016-04-05 ENCOUNTER — Ambulatory Visit (INDEPENDENT_AMBULATORY_CARE_PROVIDER_SITE_OTHER): Payer: BLUE CROSS/BLUE SHIELD | Admitting: Adult Health

## 2016-04-05 VITALS — BP 140/90 | HR 60 | Ht 64.25 in | Wt 190.5 lb

## 2016-04-05 DIAGNOSIS — R195 Other fecal abnormalities: Secondary | ICD-10-CM

## 2016-04-05 DIAGNOSIS — Z1212 Encounter for screening for malignant neoplasm of rectum: Secondary | ICD-10-CM | POA: Diagnosis not present

## 2016-04-05 DIAGNOSIS — Z1211 Encounter for screening for malignant neoplasm of colon: Secondary | ICD-10-CM | POA: Insufficient documentation

## 2016-04-05 DIAGNOSIS — Z01419 Encounter for gynecological examination (general) (routine) without abnormal findings: Secondary | ICD-10-CM

## 2016-04-05 DIAGNOSIS — K649 Unspecified hemorrhoids: Secondary | ICD-10-CM | POA: Insufficient documentation

## 2016-04-05 DIAGNOSIS — Z853 Personal history of malignant neoplasm of breast: Secondary | ICD-10-CM

## 2016-04-05 LAB — HEMOCCULT GUIAC POC 1CARD (OFFICE): FECAL OCCULT BLD: POSITIVE — AB

## 2016-04-05 NOTE — Progress Notes (Signed)
Patient ID: Vanessa Neal, female   DOB: 1960-06-08, 55 y.o.   MRN: [id number] History of Present Illness: Vanessa Neal is a 55 year old white female,married, in for well woman gyn exam, she is sp hysterectomy 06/16/14 and has history of breast cancer.She saw Dr Whitney Muse yesterday.She requests labs today.  PCP is Dr Luan Pulling, but she has not seen him yet.    Current Medications, Allergies, Past Medical History, Past Surgical History, Family History and Social History were reviewed in [employer] record.     Review of Systems: Patient denies any headaches, hearing loss, fatigue, blurred vision, shortness of breath, chest pain, abdominal pain, problems with urination, or intercourse. No joint pain or mood swings. Has to take stool softener at times.   Physical Exam:BP 140/90 (BP Location: Left Arm, Patient Position: Sitting, Cuff Size: Normal)   Pulse 60   Ht 5' 4.25" (1.632 m)   Wt 190 lb 8 oz (86.4 kg)   BMI 32.45 kg/m  General:  Well developed, well nourished, no acute distress Skin:  Warm and dry Neck:  Midline trachea, slightly enlarged thyroid, good ROM, no lymphadenopathy Lungs; Clear to auscultation bilaterally Breast:  No dominant palpable mass, retraction, or nipple discharge,has indentation on right where mass removal was Cardiovascular: Regular rate and rhythm Abdomen:  Soft, non tender, no hepatosplenomegaly Pelvic:  External genitalia is normal in appearance, no lesions.  The vagina is normal in appearance. Urethra has no lesions or masses. The cervix and uterus are absent. No adnexal masses or tenderness noted.Bladder is non tender, no masses felt. Rectal: Good sphincter tone, no polyps, + hemorrhoids felt.  Hemoccult positive.Had colonoscopy 02/2012.Will give 3 hemoccult cards to do ,if any + will refer back to Dr Oneida Alar to evaluate. Extremities/musculoskeletal:  No swelling or varicosities noted, no clubbing or cyanosis Psych:  No mood changes, alert and  cooperative,seems happy PHQ 2 score 0.  Impression: 1. Well woman exam with routine gynecological exam   2. Fecal occult blood test positive   3. History of breast cancer in female   4. Hemorrhoids, unspecified hemorrhoid type       Plan: Check CBC,CMP,TSH and lipids,A1c and vitamin D Physical in 1 year Mammogram yearly Given 3 hemoccult cards to do.

## 2016-04-05 NOTE — Patient Instructions (Signed)
Physical in 1 year Mammogram yearly Do 3 hemoccult cards

## 2016-04-06 LAB — TSH: TSH: 3.64 u[IU]/mL (ref 0.450–4.500)

## 2016-04-06 LAB — CBC
HEMOGLOBIN: 13.4 g/dL (ref 11.1–15.9)
Hematocrit: 38.1 % (ref 34.0–46.6)
MCH: 34 pg — AB (ref 26.6–33.0)
MCHC: 35.2 g/dL (ref 31.5–35.7)
MCV: 97 fL (ref 79–97)
PLATELETS: 278 10*3/uL (ref 150–379)
RBC: 3.94 x10E6/uL (ref 3.77–5.28)
RDW: 13.4 % (ref 12.3–15.4)
WBC: 6.1 10*3/uL (ref 3.4–10.8)

## 2016-04-06 LAB — LIPID PANEL
CHOL/HDL RATIO: 4.5 ratio — AB (ref 0.0–4.4)
Cholesterol, Total: 193 mg/dL (ref 100–199)
HDL: 43 mg/dL (ref >39)
LDL Calculated: 128 mg/dL — ABNORMAL HIGH (ref 0–99)
TRIGLYCERIDES: 112 mg/dL (ref 0–149)
VLDL CHOLESTEROL CAL: 22 mg/dL (ref 5–40)

## 2016-04-06 LAB — COMPREHENSIVE METABOLIC PANEL
ALBUMIN: 4.4 g/dL (ref 3.5–5.5)
ALT: 33 IU/L — AB (ref 0–32)
AST: 31 IU/L (ref 0–40)
Albumin/Globulin Ratio: 1.4 (ref 1.2–2.2)
Alkaline Phosphatase: 54 IU/L (ref 39–117)
BUN / CREAT RATIO: 24 — AB (ref 9–23)
BUN: 16 mg/dL (ref 6–24)
Bilirubin Total: 0.5 mg/dL (ref 0.0–1.2)
CALCIUM: 9.6 mg/dL (ref 8.7–10.2)
CHLORIDE: 101 mmol/L (ref 96–106)
CO2: 23 mmol/L (ref 18–29)
CREATININE: 0.68 mg/dL (ref 0.57–1.00)
GFR, EST AFRICAN AMERICAN: 114 mL/min/{1.73_m2} (ref >59)
GFR, EST NON AFRICAN AMERICAN: 99 mL/min/{1.73_m2} (ref >59)
Globulin, Total: 3.1 g/dL (ref 1.5–4.5)
Glucose: 88 mg/dL (ref 65–99)
Potassium: 4.5 mmol/L (ref 3.5–5.2)
Sodium: 140 mmol/L (ref 134–144)
Total Protein: 7.5 g/dL (ref 6.0–8.5)

## 2016-04-06 LAB — HEMOGLOBIN A1C
Est. average glucose Bld gHb Est-mCnc: 131 mg/dL
HEMOGLOBIN A1C: 6.2 % — AB (ref 4.8–5.6)

## 2016-04-06 LAB — VITAMIN D 25 HYDROXY (VIT D DEFICIENCY, FRACTURES): Vit D, 25-Hydroxy: 48.9 ng/mL (ref 30.0–100.0)

## 2016-04-10 ENCOUNTER — Telehealth: Payer: Self-pay | Admitting: Adult Health

## 2016-04-10 NOTE — Telephone Encounter (Signed)
Left message to increase activity and decrease carbs as A1c was elevated at 6.2 and LDL mildly elevated

## 2016-07-28 ENCOUNTER — Other Ambulatory Visit (HOSPITAL_COMMUNITY): Payer: Self-pay | Admitting: Oncology

## 2016-09-08 ENCOUNTER — Other Ambulatory Visit (HOSPITAL_COMMUNITY): Payer: BLUE CROSS/BLUE SHIELD

## 2016-09-14 ENCOUNTER — Ambulatory Visit (HOSPITAL_COMMUNITY)
Admission: RE | Admit: 2016-09-14 | Discharge: 2016-09-14 | Disposition: A | Discharge status: Home / Self Care | Payer: BLUE CROSS/BLUE SHIELD | Source: Ambulatory Visit | Attending: Hematology & Oncology | Admitting: Hematology & Oncology

## 2016-09-14 DIAGNOSIS — Z9079 Acquired absence of other genital organ(s): Secondary | ICD-10-CM | POA: Insufficient documentation

## 2016-09-14 DIAGNOSIS — Z90722 Acquired absence of ovaries, bilateral: Secondary | ICD-10-CM | POA: Diagnosis not present

## 2016-09-14 DIAGNOSIS — Z90710 Acquired absence of both cervix and uterus: Secondary | ICD-10-CM | POA: Insufficient documentation

## 2016-09-14 DIAGNOSIS — M8580 Other specified disorders of bone density and structure, unspecified site: Secondary | ICD-10-CM

## 2016-09-14 DIAGNOSIS — M8588 Other specified disorders of bone density and structure, other site: Secondary | ICD-10-CM | POA: Insufficient documentation

## 2016-09-14 DIAGNOSIS — C50911 Malignant neoplasm of unspecified site of right female breast: Secondary | ICD-10-CM

## 2016-09-14 DIAGNOSIS — M85852 Other specified disorders of bone density and structure, left thigh: Secondary | ICD-10-CM | POA: Diagnosis not present

## 2016-11-27 DIAGNOSIS — E049 Nontoxic goiter, unspecified: Secondary | ICD-10-CM | POA: Diagnosis not present

## 2016-11-27 DIAGNOSIS — N951 Menopausal and female climacteric states: Secondary | ICD-10-CM | POA: Diagnosis not present

## 2016-11-27 DIAGNOSIS — R600 Localized edema: Secondary | ICD-10-CM | POA: Diagnosis not present

## 2016-11-27 DIAGNOSIS — Z853 Personal history of malignant neoplasm of breast: Secondary | ICD-10-CM | POA: Diagnosis not present

## 2016-12-19 DIAGNOSIS — Z029 Encounter for administrative examinations, unspecified: Secondary | ICD-10-CM

## 2017-03-05 DIAGNOSIS — C44519 Basal cell carcinoma of skin of other part of trunk: Secondary | ICD-10-CM | POA: Diagnosis not present

## 2017-03-05 DIAGNOSIS — C44612 Basal cell carcinoma of skin of right upper limb, including shoulder: Secondary | ICD-10-CM | POA: Diagnosis not present

## 2017-03-27 ENCOUNTER — Encounter (HOSPITAL_COMMUNITY): Payer: BLUE CROSS/BLUE SHIELD

## 2017-03-27 ENCOUNTER — Ambulatory Visit (HOSPITAL_COMMUNITY)
Admission: RE | Admit: 2017-03-27 | Discharge: 2017-03-27 | Disposition: A | Discharge status: Home / Self Care | Payer: BLUE CROSS/BLUE SHIELD | Source: Ambulatory Visit | Attending: Hematology & Oncology | Admitting: Hematology & Oncology

## 2017-03-27 DIAGNOSIS — C50911 Malignant neoplasm of unspecified site of right female breast: Secondary | ICD-10-CM

## 2017-03-27 DIAGNOSIS — Z853 Personal history of malignant neoplasm of breast: Secondary | ICD-10-CM | POA: Insufficient documentation

## 2017-03-27 DIAGNOSIS — R922 Inconclusive mammogram: Secondary | ICD-10-CM | POA: Diagnosis not present

## 2017-04-04 NOTE — Progress Notes (Signed)
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 Pablo Ledger at [facility]      07/05/2010 - 08/18/2014 Chemotherapy    Tamoxifen 20 mg daily      08/19/2014 -  Anti-estrogen oral therapy    Aromasin daily      09/29/2014 Survivorship    Prolia started for osteopenia in the setting of Aromatase inhibitor.      10/08/2014 Pathology Results    BCI- low risk of late recurrence (2.7% btweenyrs 5-10), high likelihood of benefit from extended endocrine therapy, high relative risk reduction (16.5% absolute benefit), low risk of overall recurrence (4.8% btween yrs 0-10), and high benefit from EET.         INTERVAL HISTORY:  Vanessa Neal 56 y.o. female returns for routine follow-up for right breast cancer.   Overall, she tells me she has been feeling very well. Appetite and energy levels both 100%.  Denies any new breast complaints including lumps, skin changes, or nipple discharge.  She is up-to-date with her mammogram.    She has recently changed PCPs; she now sees Dr. Luan Pulling.  She has received her flu shot for this year.  She has had TAH/BSO; she still sees gynecology annually for physical exam.  She tells me she is due to see gynecology within the  next month or so.    Her last bone density was in 08/2016; she was getting Prolia injections here at the cancer center, but once she completed her anti-estrogen therapy she stopped the Prolia for concerns that her insurance would no longer cover them.   She has been working on weaning off of immediate release Effexor "on my own." States that when she tried to stop taking the medication suddenly, she had nausea, headaches, etc.  So, she resumed the Effexor at low-dose. She is currently taking 1/2 tab Effexor 37.5 mg (18.75 mg) daily.   She remains active at work; she works as a Occupational psychologist at [employer]. 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  They are currently preparing for Christmas with decorations, displays, gifts, cards, etc.  She likes her work.     REVIEW OF SYSTEMS:  Review of Systems  Constitutional: Negative.  Negative for chills, fatigue and fever.  HENT:  Negative.  Negative for lump/mass and nosebleeds.   Eyes: Negative.   Respiratory: Negative.  Negative for cough and shortness of breath.   Cardiovascular: Positive for leg swelling (minimal; "just when I'm on my feet all day." ). Negative for chest pain.  Gastrointestinal: Negative.  Negative for abdominal pain, blood in stool, constipation, diarrhea, nausea and vomiting.  Endocrine: Positive for  hot flashes ("they're getting better").  Genitourinary: Negative.  Negative for dysuria and hematuria.   Musculoskeletal: Negative.  Negative for arthralgias.  Skin: Negative.  Negative for rash.  Neurological: Negative.  Negative for dizziness and headaches.  Hematological: Negative.  Negative for adenopathy. Does not bruise/bleed easily.  Psychiatric/Behavioral: Negative.  Negative for depression and sleep disturbance. The patient is not nervous/anxious.      PAST MEDICAL/SURGICAL HISTORY:  Past Medical History:  Diagnosis Date  . BRCA negative 02/14/2013  . Breast cancer ([place])     . Fecal occult blood test positive 04/29/2014  . Infiltrating ductal carcinoma of right female breast ([place]) 01/04/2011  . Ovarian cyst   . Right thyroid nodule    Past Surgical History:  Procedure Laterality Date  . BREAST CYST ASPIRATION  01/2012  . BREAST CYST EXCISION    . BREAST LUMPECTOMY     right  . CESAREAN SECTION     X 2  . CHOLECYSTECTOMY    . LIPOMA EXCISION     from back  . MASTECTOMY Right      SOCIAL HISTORY:  Social History   Socioeconomic History  . Marital status: Married    Spouse name: Not on file  . Number of children: Not on file  . Years of education: Not on file  . Highest education level: Not on file  Social Needs  . Financial resource strain: Not on file  . Food insecurity - worry: Not on file  . Food insecurity - inability: Not on file  . Transportation needs - medical: Not on file  . Transportation needs - non-medical: Not on file  Occupational History  . Not on file  Tobacco Use  . Smoking status: Never Smoker  . Smokeless tobacco: Never Used  Substance and Sexual Activity  . Alcohol use: No  . Drug use: No  . Sexual activity: Yes    Birth control/protection: Other-see comments, Surgical    Comment: Pt had chemo and no longer has a period.  Other Topics Concern  . Not on file  Social History Narrative  . Not on file    FAMILY HISTORY:  Family History  Problem Relation Age of Onset  . Cancer Mother        breast  . Kidney failure Father   . Cancer Maternal Grandmother        colon  . Colon cancer Maternal Grandmother   . Cancer Paternal Grandmother        ovarian  . Cancer Sister        melanoma    CURRENT MEDICATIONS:  Outpatient Encounter Medications as of 04/05/2017  Medication Sig  . calcium-vitamin D (OSCAL WITH D) 500-200 MG-UNIT per tablet Take 1 tablet by mouth 2 (two) times daily.   Marland Kitchen venlafaxine (EFFEXOR) 37.5 MG tablet TAKE (1) TABLET BY MOUTH THREE TIMES DAILY WITH FOOD. (Patient taking differently: half a  tablet daily)  . Multiple Vitamins-Calcium (ONE-A-DAY WOMENS PO) Take by mouth daily.  . [DISCONTINUED] furosemide (LASIX) 20 MG tablet TAKE (1) TABLET BY MOUTH ONCE DAILY AS NEEDED.  . [DISCONTINUED] loratadine (CLARITIN) 10 MG tablet Take 10 mg by mouth daily as needed for allergies.  . [DISCONTINUED] Methylsulfonylmethane (MSM PO) Take by mouth daily.   No facility-administered encounter medications on file as of 04/05/2017.     ALLERGIES:  No Known Allergies   PHYSICAL EXAM:  ECOG Performance status: 0 - Asymptomatic   Vitals:   04/05/17 0847  BP: 134/86  Pulse:  60  Resp: 18  Temp: 97.6 F (36.4 C)  SpO2: 100%   Filed Weights   04/05/17 0847  Weight: 185 lb 14.4 oz (84.3 kg)    Physical Exam  Constitutional: She is oriented to person, place, and time and well-developed, well-nourished, and in no distress.  HENT:  Head: Normocephalic.  Mouth/Throat: Oropharynx is clear and moist. No oropharyngeal exudate.  Eyes: Conjunctivae are normal. Pupils are equal, round, and reactive to light. No scleral icterus.  Neck: Normal range of motion. Neck supple.  Cardiovascular: Normal rate and regular rhythm.  Pulmonary/Chest: Effort normal and breath sounds normal. No respiratory distress. She has no wheezes.    Abdominal: Soft. Bowel sounds are normal. There is no tenderness.  Musculoskeletal: Normal range of motion. She exhibits no edema.  Lymphadenopathy:    She has no cervical adenopathy.       Right: No supraclavicular adenopathy present.       Left: No supraclavicular adenopathy present.  Neurological: She is alert and oriented to person, place, and time. No cranial nerve deficit. Gait normal.  Skin: Skin is warm and dry. No rash noted.  Psychiatric: Mood, memory, affect and judgment normal.  Nursing note and vitals reviewed.    LABORATORY DATA:  I have reviewed the labs as listed.  CBC    Component Value Date/Time   WBC 6.1 04/05/2016 0936   WBC 6.8 09/27/2015  0816   RBC 3.94 04/05/2016 0936   RBC 4.04 09/27/2015 0816   HGB 13.4 04/05/2016 0936   HCT 38.1 04/05/2016 0936   PLT 278 04/05/2016 0936   MCV 97 04/05/2016 0936   MCH 34.0 (H) 04/05/2016 0936   MCH 33.9 09/27/2015 0816   MCHC 35.2 04/05/2016 0936   MCHC 34.2 09/27/2015 0816   RDW 13.4 04/05/2016 0936   LYMPHSABS 3.0 09/27/2015 0816   MONOABS 0.4 09/27/2015 0816   EOSABS 0.1 09/27/2015 0816   BASOSABS 0.0 09/27/2015 0816   CMP Latest Ref Rng & Units 04/05/2016 10/28/2015 09/27/2015  Glucose 65 - 99 mg/dL 88 - 146(H)  BUN 6 - 24 mg/dL 16 - 16  Creatinine 0.57 - 1.00 mg/dL 0.68 - 0.82  Sodium 134 - 144 mmol/L 140 - 138  Potassium 3.5 - 5.2 mmol/L 4.5 - 3.9  Chloride 96 - 106 mmol/L 101 - 101  CO2 18 - 29 mmol/L 23 - 28  Calcium 8.7 - 10.2 mg/dL 9.6 9.5 10.4(H)  Total Protein 6.0 - 8.5 g/dL 7.5 - 8.0  Total Bilirubin 0.0 - 1.2 mg/dL 0.5 - 0.8  Alkaline Phos 39 - 117 IU/L 54 - 43  AST 0 - 40 IU/L 31 - 29  ALT 0 - 32 IU/L 33(H) - 34    PENDING LABS:    DIAGNOSTIC IMAGING:  *The following radiologic images and reports have been reviewed independently and agree with below findings.  Most recent mammogram 03/27/17 CLINICAL DATA:  56 year old patient presents for annual exam. She has a history right breast cancer, status post lumpectomy in 2011, followed by radiation therapy. She is asymptomatic.  EXAM: 2D DIGITAL DIAGNOSTIC BILATERAL MAMMOGRAM WITH CAD AND ADJUNCT TOMO  COMPARISON:  Previous exam(s).  ACR Breast Density Category c: The breast tissue is heterogeneously dense, which may obscure small masses.  FINDINGS: No mass, nonsurgical architectural distortion, or suspicious microcalcification is identified to suggest malignancy in either breast. Stable lumpectomy changes in the outer right breast. Surgical clips in the right axilla.  Mammographic images were processed with CAD.  IMPRESSION: No  evidence of malignancy in either breast. The patient can  return to the screening population.  RECOMMENDATION: Screening mammogram in one year.(Code:SM-B-01Y)  I have discussed the findings and recommendations with the patient. Results were also provided in writing at the conclusion of the visit. If applicable, a reminder letter will be sent to the patient regarding the next appointment.  BI-RADS CATEGORY  2: Benign.   Electronically Signed   By: Curlene Dolphin M.D.   On: 03/27/2017 09:11    PATHOLOGY:  (R) lumpectomy surgical path: 04/13/10 REPORT OF SURGICAL PATHOLOGY  Accession #: [id number] Patient Name: NAHIA, NISSAN Visit # : [id number]  MRN: [id number] Physician: Stark Klein DOB/Age [birth year] (Age: 2) Gender: F Collected Date: 04/13/2010 Received Date: 04/13/2010  FINAL DIAGNOSIS  1. Lymph node, sentinel, biopsy, right : ONE LYMPH NODE, NEGATIVE FOR METASTATIC CARCINOMA (0/1)  2. Lymph node, sentinel, biopsy, right : ONE LYMPH NODE, NEGATIVE FOR METASTATIC CARCINOMA (0/1) 3. Lymph node, sentinel, biopsy, right : ONE LYMPH NODE, NEGATIVE FOR METASTATIC CARCINOMA (0/1) 4. Breast, lumpectomy, right : RESIDUAL INVASIVE DUCTAL CARCINOMA, 1.1 CM, NCHS GRADE II. NO ANGIOLYMPHATIC INVASION. RESECTION MARGINS CLEAR. 5. Breast, excision, right, new superior margin : BENIGN BREAST TISSUE WITH FIBROTIC CHANGES, NO EVIDENCE OF MALIGNANCY.  ELECTRONIC SIGNATURE : Brooks Sailors, H. Barnetta Chapel, Industrial/product designer, Electronic Signature  MICROSCOPIC DESCRIPTION 4. BREAST, WITH LYMPH NODE SAMPLING Specimen, including laterality: RIGHT BREAST. Procedure: LUMPECTOMY Tumor size of largest invasive carcinoma (gross measurement or glass slide measurement): 1.1 cm Margins: Invasive component, distance to closest margin: ALL MARGINS: >1 CM In SITU component, distance to closest margin: ALL MARGINS: >1 CM Histologic type, invasive component: INVASIVE DUCTAL CARCINOMA Grade, invasive component (Nottingham  combined histologic score): II Tubule formation grade: 2 Nuclear pleomorphism grade: 2 Mitotic grade: 2 Ductal carcinoma in situ: N/A Lobular neoplasia present: NO Treatment effect (if treated with neoadjuvant therapy): YES Multicentric (separate tumors in different quadrants): NO Multifocal (separate tumors in same quadrant or biopsy): NO Macroscopic or microscopic extent of tumor: Skin: N/A Nipple: N/A Skeletal muscle: N/A Axillary lymph nodes: Number examined: 3 Number with metastasis: 0 ITC (isolated tumor cells, < 0.89m): 0 Micrometastasis (> 0.241m < 19m43m 0 Metastasis > 2 mm: 0 Extracapsular extension: 0 Method of detection of metastases: H and E, cytokeratin or both: BOTH METHODS TNM Code: pT1c, pN0(sn-)(i-), Breast Prognostic Markers: Estrogen receptor: 75%, POSITIVE Progesterone receptor: 97%, POSITIVE Ki 67 (Mib-1): 85% Her 2 neu by CISH: NO AMPLIFICATION, THE RATIO OF HER-2:CEP 17 WAS 1.73. THE BREAST PROGNOSTIC PROFILE WILL BE REPEATED AND AN ADDENDUM WILL FOLLOW. Non-neoplastic breast: EXTENSIVE POSTTREATMENT EFFECT AND FIBROCYSTIC CHANGES  CASE COMMENTS STAINS USED IN DIAGNOSIS: CK AE1AE3 CK AE1AE3 CK AE1AE3 Universal Negative Control-Mouse ER-ACIS PR-ACIS CISH CUT 1 BLANK SLIDE FOR IHC RECUT 1 SLIDE RECUT 1 SLIDE  ADDENDUM PROGNOSTIC INDICATORS - ACIS Interpretation This carcinoma is positive for estrogen receptor and positive for progesterone receptor expression.  Results IMMUNOHISTOCHEMICAL AND MORPHOMETRIC ANALYSIS BY THE AUTOMATED CELLULAR IMAGING SYSTEM (ACIS) Estrogen Receptor (Negative, <1%): 98%, POSITIVE Progesterone Receptor (Negative, <1%): 57%, POSITIVE All controls stained appropriately KisSuan HalterosVonna KotykatIndustrial/product designerleBrewing technologistSigned 12 02 2011) CHROMOGENIC IN-SITU HYBRIDIZATION Interpretation HER-2/NEU BY CISH - NO AMPLIFICATION OF HER-2 DETECTED. THE RATIO OF  HER-2: CEP 17 SIGNALS WAS 1.37. 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 Treated with neoadjuvant chemo with Adriamycin/Cytoxan x 4 cycles, followed by Taxol x 12 cycles.  Underwent lumpectomy with SLNB on 04/13/10 with Dr. Barry Dienes, followed by adjuvant radiation therapy.  Started anti-estrogen therapy with Tamoxifen in 06/2010. She later had hysterectomy in 2016 and was switched to Aromasin. She completed 5 years of anti-estrogen therapy. BCI testing indicated LOW likelihood of benefit of extension of anti-estrogen therapy beyond 5 years.   -Last mammogram 02/2017 negative. Discussed with her that her next mammogram will likely be screening vs diagnostic since it has been 7 years since her lumpectomy.  The general protocol is to resume annual screening mammogram after 7 years of diagnostic imaging.  Therefore, she will be due for screening mammogram in 02/2018; orders placed today.  -Clinical breast exam performed today and no clinical evidence of recurrent disease.  -Return to cancer center in 1 year for follow-up. No labs necessary as she has a PCP who periodically collects blood work.   Bone health:  -Last DEXA scan on 09/14/16 showed osteopenia with T-score -1.8.  -She was on Prolia for ~1 year from 09/2014-09/2015 (received 3 total doses).  Since she has completed anti-estrogen therapy, I will defer any future management of her bone density to her PCP or gynecology team.  She agrees with this plan.  -Encouraged continued calcium/vitamin D supplementation and increase weight bearing exercises as tolerated.   Weaning off of Effexor:  -She has been taking immediate release  Effexor for quite some time for hot flashes. She wishes to wean off of this medication because her hot flashes are improved and she does not want to take the medication any longer.  -Recommended the following weaning schedule for her:   *Continue 1/2 tab (18.75 mg) daily x 2 weeks.   *Then take 1/2 tab every other day for 2 weeks.   *Then stop.  Encouraged her to call us if she has any additional withdrawal symptoms and we can provide additional recommendations. She agreed with this plan.   Health maintenance/Wellness promotion:  -Encouraged continued healthy diet and exercise as tolerated.  She is actively trying to lose weight; commended her efforts and encouraged her to keep up the good work.  -Maintain follow-up with PCP and other specialists as directed for health maintenance exams and other age/gender-appropriate cancer screenings.  -She has already received her flu vaccine for this season.        Dispo:  -[facility] breast screening mammogram due in 02/2018; orders placed today.  -Return to cancer center in 1 year for follow-up; no labs necessary.    All questions were answered to patient's stated satisfaction. Encouraged patient to call with any new concerns or questions before her next visit to the cancer center and we can certain see her sooner, if needed.    Plan of care discussed with Dr. Talbert Cage, who agrees with the above aforementioned.    Orders placed this encounter:  No orders of the defined types were placed in this encounter.     Mike Craze, NP [place] [phone]

## 2017-04-05 ENCOUNTER — Encounter (HOSPITAL_COMMUNITY): Payer: Self-pay | Admitting: Adult Health

## 2017-04-05 ENCOUNTER — Other Ambulatory Visit: Payer: Self-pay

## 2017-04-05 ENCOUNTER — Other Ambulatory Visit (HOSPITAL_COMMUNITY): Payer: Self-pay | Admitting: Adult Health

## 2017-04-05 ENCOUNTER — Encounter (HOSPITAL_COMMUNITY): Payer: BLUE CROSS/BLUE SHIELD | Attending: Hematology & Oncology | Admitting: Adult Health

## 2017-04-05 VITALS — BP 134/86 | HR 60 | Temp 97.6°F | Resp 18 | Wt 185.9 lb

## 2017-04-05 DIAGNOSIS — Z9221 Personal history of antineoplastic chemotherapy: Secondary | ICD-10-CM

## 2017-04-05 DIAGNOSIS — Z853 Personal history of malignant neoplasm of breast: Secondary | ICD-10-CM | POA: Diagnosis not present

## 2017-04-05 DIAGNOSIS — Z1231 Encounter for screening mammogram for malignant neoplasm of breast: Secondary | ICD-10-CM

## 2017-04-05 DIAGNOSIS — Z170 Estrogen receptor positive status [ER+]: Secondary | ICD-10-CM

## 2017-04-05 DIAGNOSIS — Z923 Personal history of irradiation: Secondary | ICD-10-CM

## 2017-04-05 DIAGNOSIS — M8580 Other specified disorders of bone density and structure, unspecified site: Secondary | ICD-10-CM

## 2017-04-05 DIAGNOSIS — C50911 Malignant neoplasm of unspecified site of right female breast: Secondary | ICD-10-CM

## 2017-04-05 NOTE — Patient Instructions (Signed)
[facility] Discharge Instructions  RECOMMENDATIONS MADE BY THE CONSULTANT AND ANY TEST RESULTS WILL BE SENT TO YOUR REFERRING PHYSICIAN.  You were seen today by Gretchen Dawson, NP  See schedulers up front for appointments   Thank you for choosing [facility] at [facility] to provide your oncology and hematology care.  To afford each patient quality time with our provider, please arrive at least 15 minutes before your scheduled appointment time.    If you have a lab appointment with the Cancer Center please come in thru the  Main Entrance and check in at the main information desk  You need to re-schedule your appointment should you arrive 10 or more minutes late.  We strive to give you quality time with our providers, and arriving late affects you and other patients whose appointments are after yours.  Also, if you no show three or more times for appointments you may be dismissed from the clinic at the providers discretion.     Again, thank you for choosing [facility].  Our hope is that these requests will decrease the amount of time that you wait before being seen by our physicians.       _____________________________________________________________  Should you have questions after your visit to [facility], please contact our office at [phone] between the hours of 8:30 a.m. and 4:30 p.m.  Voicemails left after 4:30 p.m. will not be returned until the following business day.  For prescription refill requests, have your pharmacy contact our office.       Resources For Cancer Patients and their Caregivers ? American Cancer Society: Can assist with transportation, wigs, general needs, runs Look Good Feel Better.        1-888-227-6333 ? Cancer Care: Provides financial assistance, online support groups, medication/co-pay assistance.  1-800-813-HOPE (4673) ? [facility] Assists [place] cancer patients and their families through emotional , educational and financial support.  336-427-4357 ? [place] DSS Where to apply for food stamps, Medicaid and utility assistance. 336-342-1394 ? [facility]: Transportation to medical appointments. 336-347-2287 ? Social Security Administration: May apply for disability if have a Stage IV cancer. 336-342-7796 1-800-772-1213 ? [place] Aging, Disability and Transit Services: Assists with nutrition, care and transit needs. 336-349-2343  Cancer Center Support Programs: @10RELATIVEDAYS@ > Cancer Support Group  2nd Tuesday of the month 1pm-2pm, Journey Room  > Creative Journey  3rd Tuesday of the month 1130am-1pm, Journey Room  > Look Good Feel Better  1st Wednesday of the month 10am-12 noon, Journey Room (Call American Cancer Society to register 1-800-395-5775)    

## 2017-04-06 ENCOUNTER — Ambulatory Visit (HOSPITAL_COMMUNITY): Payer: BLUE CROSS/BLUE SHIELD | Admitting: Oncology

## 2017-04-06 ENCOUNTER — Ambulatory Visit (HOSPITAL_COMMUNITY): Payer: BLUE CROSS/BLUE SHIELD | Admitting: Hematology & Oncology

## 2017-05-03 ENCOUNTER — Ambulatory Visit (INDEPENDENT_AMBULATORY_CARE_PROVIDER_SITE_OTHER): Payer: BLUE CROSS/BLUE SHIELD | Admitting: Adult Health

## 2017-05-03 ENCOUNTER — Encounter: Payer: Self-pay | Admitting: Adult Health

## 2017-05-03 VITALS — BP 120/80 | HR 95 | Ht 64.0 in | Wt 184.0 lb

## 2017-05-03 DIAGNOSIS — Z1211 Encounter for screening for malignant neoplasm of colon: Secondary | ICD-10-CM

## 2017-05-03 DIAGNOSIS — Z01411 Encounter for gynecological examination (general) (routine) with abnormal findings: Secondary | ICD-10-CM | POA: Diagnosis not present

## 2017-05-03 DIAGNOSIS — Z01419 Encounter for gynecological examination (general) (routine) without abnormal findings: Secondary | ICD-10-CM

## 2017-05-03 DIAGNOSIS — M8580 Other specified disorders of bone density and structure, unspecified site: Secondary | ICD-10-CM

## 2017-05-03 DIAGNOSIS — Z853 Personal history of malignant neoplasm of breast: Secondary | ICD-10-CM | POA: Diagnosis not present

## 2017-05-03 DIAGNOSIS — Z1212 Encounter for screening for malignant neoplasm of rectum: Secondary | ICD-10-CM | POA: Diagnosis not present

## 2017-05-03 DIAGNOSIS — R7309 Other abnormal glucose: Secondary | ICD-10-CM | POA: Diagnosis not present

## 2017-05-03 LAB — HEMOCCULT GUIAC POC 1CARD (OFFICE): Fecal Occult Blood, POC: NEGATIVE

## 2017-05-03 MED ORDER — RISEDRONATE SODIUM 150 MG PO TABS: 150.0000 mg | ORAL_TABLET | ORAL | 12 refills | Status: DC

## 2017-05-03 NOTE — Progress Notes (Signed)
Patient ID: Vanessa Neal, female   DOB: 19-Mar-1961, 56 y.o.   MRN: [id number] History of Present Illness: Vanessa Neal is a 56 year old white female, married in for well woman gyn exam, she is sp hysterectomy.And is cancer free for 7 years now from breast cancer.  PCP is Dr Luan Pulling.   Current Medications, Allergies, Past Medical History, Past Surgical History, Family History and Social History were reviewed in [employer] record.     Review of Systems: Patient denies any headaches, hearing loss, fatigue, blurred vision, shortness of breath, chest pain, abdominal pain, problems with bowel movements, urination, or intercourse. No joint pain or mood swings.    Physical Exam:BP 120/80 (BP Location: Left Arm, Patient Position: Sitting, Cuff Size: Large)   Pulse 95   Ht 5\' 4"  (1.626 m)   Wt 184 lb (83.5 kg)   BMI 31.58 kg/m  General:  Well developed, well nourished, no acute distress Skin:  Warm and dry Neck:  Midline trachea, normal thyroid, good ROM, no lymphadenopathy Lungs; Clear to auscultation bilaterally Breast:  No dominant palpable mass, retraction, or nipple discharge Cardiovascular: Regular rate and rhythm Abdomen:  Soft, non tender, no hepatosplenomegaly Pelvic:  External genitalia is normal in appearance, no lesions.  The vagina is normal in appearance. Urethra has no lesions or masses. The cervix and uterus are absent.  No adnexal masses or tenderness noted.Bladder is non tender, no masses felt. Rectal: Good sphincter tone, no polyps, or hemorrhoids felt.  Hemoccult negative. Extremities/musculoskeletal:  No swelling or varicosities noted, no clubbing or cyanosis Psych:  No mood changes, alert and cooperative,seems happy PHQ 9 score 0.  Impression: 1. Well woman exam with routine gynecological exam   2. History of breast cancer in female   3. Osteopenia, unspecified location   4. Screening for colorectal cancer   5. Elevated hemoglobin A1c        Plan: Check CBC,CMP,TSH and lipids,A1c Physical in 1 year Mammogram yearly Meds ordered this encounter  Medications  . risedronate (ACTONEL) 150 MG tablet    Sig: Take 1 tablet (150 mg total) by mouth every 30 (thirty) days. with water on empty stomach, nothing by mouth or lie down for next 30 minutes.    Dispense:  1 tablet    Refill:  12    Order Specific Question:   Supervising Provider    Answer:   Tania Ade H [[id number]]

## 2017-05-04 LAB — TSH: TSH: 5.11 u[IU]/mL — ABNORMAL HIGH (ref 0.450–4.500)

## 2017-05-04 LAB — COMPREHENSIVE METABOLIC PANEL
ALBUMIN: 4.5 g/dL (ref 3.5–5.5)
ALT: 30 IU/L (ref 0–32)
AST: 28 IU/L (ref 0–40)
Albumin/Globulin Ratio: 1.5 (ref 1.2–2.2)
Alkaline Phosphatase: 71 IU/L (ref 39–117)
BUN / CREAT RATIO: 18 (ref 9–23)
BUN: 14 mg/dL (ref 6–24)
Bilirubin Total: 0.5 mg/dL (ref 0.0–1.2)
CALCIUM: 9.8 mg/dL (ref 8.7–10.2)
CO2: 24 mmol/L (ref 20–29)
CREATININE: 0.76 mg/dL (ref 0.57–1.00)
Chloride: 100 mmol/L (ref 96–106)
GFR calc Af Amer: 101 mL/min/{1.73_m2} (ref >59)
GFR, EST NON AFRICAN AMERICAN: 88 mL/min/{1.73_m2} (ref >59)
GLOBULIN, TOTAL: 3 g/dL (ref 1.5–4.5)
Glucose: 89 mg/dL (ref 65–99)
Potassium: 4.5 mmol/L (ref 3.5–5.2)
SODIUM: 138 mmol/L (ref 134–144)
Total Protein: 7.5 g/dL (ref 6.0–8.5)

## 2017-05-04 LAB — CBC
HEMATOCRIT: 41.1 % (ref 34.0–46.6)
HEMOGLOBIN: 14.1 g/dL (ref 11.1–15.9)
MCH: 34 pg — ABNORMAL HIGH (ref 26.6–33.0)
MCHC: 34.3 g/dL (ref 31.5–35.7)
MCV: 99 fL — ABNORMAL HIGH (ref 79–97)
Platelets: 312 10*3/uL (ref 150–379)
RBC: 4.15 x10E6/uL (ref 3.77–5.28)
RDW: 13.6 % (ref 12.3–15.4)
WBC: 6.9 10*3/uL (ref 3.4–10.8)

## 2017-05-04 LAB — LIPID PANEL
Chol/HDL Ratio: 3.9 ratio (ref 0.0–4.4)
Cholesterol, Total: 215 mg/dL — ABNORMAL HIGH (ref 100–199)
HDL: 55 mg/dL (ref >39)
LDL CALC: 135 mg/dL — AB (ref 0–99)
Triglycerides: 125 mg/dL (ref 0–149)
VLDL CHOLESTEROL CAL: 25 mg/dL (ref 5–40)

## 2017-05-04 LAB — HEMOGLOBIN A1C
ESTIMATED AVERAGE GLUCOSE: 128 mg/dL
HEMOGLOBIN A1C: 6.1 % — AB (ref 4.8–5.6)

## 2017-05-09 ENCOUNTER — Telehealth: Payer: Self-pay | Admitting: Adult Health

## 2017-05-09 NOTE — Telephone Encounter (Signed)
Left message to call about labs 

## 2017-05-10 ENCOUNTER — Telehealth: Payer: Self-pay | Admitting: Adult Health

## 2017-05-10 NOTE — Telephone Encounter (Signed)
Pt aware of labs, recheck TSH in 4 weeks, and watch carbs

## 2017-06-18 ENCOUNTER — Telehealth: Payer: Self-pay | Admitting: Adult Health

## 2017-06-18 DIAGNOSIS — R7989 Other specified abnormal findings of blood chemistry: Secondary | ICD-10-CM

## 2017-06-18 NOTE — Telephone Encounter (Signed)
Pt to get thyroid labs in am,and she stopped Actonel, due to increased body aches

## 2017-06-18 NOTE — Telephone Encounter (Signed)
Pt called stating that she would like for Anderson Malta to place an order for her to have her thyroid checked at the [facility]. Please contact pt

## 2017-06-22 DIAGNOSIS — R7989 Other specified abnormal findings of blood chemistry: Secondary | ICD-10-CM | POA: Diagnosis not present

## 2017-06-23 LAB — T4, FREE: Free T4: 0.92 ng/dL (ref 0.82–1.77)

## 2017-06-23 LAB — TSH: TSH: 3.83 u[IU]/mL (ref 0.450–4.500)

## 2017-07-11 ENCOUNTER — Telehealth: Payer: Self-pay | Admitting: *Deleted

## 2017-07-11 NOTE — Telephone Encounter (Signed)
LMOVM that thyroid is normal.

## 2017-12-12 ENCOUNTER — Ambulatory Visit (INDEPENDENT_AMBULATORY_CARE_PROVIDER_SITE_OTHER): Payer: BLUE CROSS/BLUE SHIELD | Admitting: Adult Health

## 2017-12-12 ENCOUNTER — Encounter: Payer: Self-pay | Admitting: Adult Health

## 2017-12-12 VITALS — BP 143/84 | HR 64 | Ht 64.0 in | Wt 186.0 lb

## 2017-12-12 DIAGNOSIS — L659 Nonscarring hair loss, unspecified: Secondary | ICD-10-CM | POA: Diagnosis not present

## 2017-12-12 DIAGNOSIS — Z853 Personal history of malignant neoplasm of breast: Secondary | ICD-10-CM

## 2017-12-12 DIAGNOSIS — R5383 Other fatigue: Secondary | ICD-10-CM | POA: Diagnosis not present

## 2017-12-12 DIAGNOSIS — R7309 Other abnormal glucose: Secondary | ICD-10-CM

## 2017-12-12 NOTE — Progress Notes (Signed)
  Subjective:     Patient ID: Vanessa Neal, female   DOB: Oct 12, 1960, 57 y.o.   MRN: [id number]  HPI [name] is a 57 year old white female in complaining of feeling tired and has had some hair loss. She had breast cancer years ago and had chemo.  PCP is Dr Luan Pulling.  Review of Systems +feeling tired for about 2-3 months now +hair loss  Some heat intolerance  Reviewed past medical,surgical, social and family history. Reviewed medications and allergies.     Objective:   Physical Exam BP (!) 143/84 (BP Location: Left Arm, Patient Position: Sitting, Cuff Size: Normal)   Pulse 64   Ht 5\' 4"  (1.626 m)   Wt 186 lb (84.4 kg)   BMI 31.93 kg/m  Skin warm and dry. Neck: mid line trachea, normal thyroid, good ROM, no lymphadenopathy noted. Lungs: clear to ausculation bilaterally. Cardiovascular: regular rate and rhythm.    Assessment:     1. Fatigue, unspecified type   2. Elevated hemoglobin A1c   3. Hair loss   4. History of breast cancer       Plan:     Check CBC,CMP,TSH,free T4,A1c,thyroid antibodies  F/U prn

## 2017-12-13 LAB — COMPREHENSIVE METABOLIC PANEL
ALK PHOS: 59 IU/L (ref 39–117)
ALT: 22 IU/L (ref 0–32)
AST: 26 IU/L (ref 0–40)
Albumin/Globulin Ratio: 1.3 (ref 1.2–2.2)
Albumin: 4.2 g/dL (ref 3.5–5.5)
BUN/Creatinine Ratio: 22 (ref 9–23)
BUN: 15 mg/dL (ref 6–24)
Bilirubin Total: 0.4 mg/dL (ref 0.0–1.2)
CALCIUM: 9.8 mg/dL (ref 8.7–10.2)
CO2: 21 mmol/L (ref 20–29)
Chloride: 102 mmol/L (ref 96–106)
Creatinine, Ser: 0.68 mg/dL (ref 0.57–1.00)
GFR calc Af Amer: 112 mL/min/{1.73_m2} (ref >59)
GFR, EST NON AFRICAN AMERICAN: 97 mL/min/{1.73_m2} (ref >59)
GLOBULIN, TOTAL: 3.2 g/dL (ref 1.5–4.5)
Glucose: 90 mg/dL (ref 65–99)
Potassium: 5.2 mmol/L (ref 3.5–5.2)
SODIUM: 138 mmol/L (ref 134–144)
Total Protein: 7.4 g/dL (ref 6.0–8.5)

## 2017-12-13 LAB — CBC
Hematocrit: 37 % (ref 34.0–46.6)
Hemoglobin: 13 g/dL (ref 11.1–15.9)
MCH: 33.5 pg — ABNORMAL HIGH (ref 26.6–33.0)
MCHC: 35.1 g/dL (ref 31.5–35.7)
MCV: 95 fL (ref 79–97)
PLATELETS: 282 10*3/uL (ref 150–450)
RBC: 3.88 x10E6/uL (ref 3.77–5.28)
RDW: 13.9 % (ref 12.3–15.4)
WBC: 6.7 10*3/uL (ref 3.4–10.8)

## 2017-12-13 LAB — T4, FREE: Free T4: 0.9 ng/dL (ref 0.82–1.77)

## 2017-12-13 LAB — THYROID PEROXIDASE ANTIBODY: Thyroperoxidase Ab SerPl-aCnc: 27 IU/mL (ref 0–34)

## 2017-12-13 LAB — TSH: TSH: 3.96 u[IU]/mL (ref 0.450–4.500)

## 2017-12-13 LAB — HEMOGLOBIN A1C
ESTIMATED AVERAGE GLUCOSE: 123 mg/dL
HEMOGLOBIN A1C: 5.9 % — AB (ref 4.8–5.6)

## 2017-12-17 ENCOUNTER — Telehealth: Payer: Self-pay | Admitting: *Deleted

## 2017-12-18 NOTE — Telephone Encounter (Signed)
Patient informed of lab results.  States her hair has been thinning and she is just tired all the time.  Patient would like to discuss.

## 2017-12-18 NOTE — Telephone Encounter (Signed)
Pt aware labs normal,but if continues to be tired, can check iron and ferritin levels

## 2018-01-14 ENCOUNTER — Other Ambulatory Visit (HOSPITAL_COMMUNITY): Payer: Self-pay | Admitting: Pulmonary Disease

## 2018-01-14 DIAGNOSIS — R55 Syncope and collapse: Secondary | ICD-10-CM | POA: Diagnosis not present

## 2018-01-14 DIAGNOSIS — R5383 Other fatigue: Secondary | ICD-10-CM | POA: Diagnosis not present

## 2018-01-14 DIAGNOSIS — M79602 Pain in left arm: Secondary | ICD-10-CM | POA: Diagnosis not present

## 2018-01-14 DIAGNOSIS — R531 Weakness: Secondary | ICD-10-CM | POA: Diagnosis not present

## 2018-01-14 DIAGNOSIS — R110 Nausea: Secondary | ICD-10-CM

## 2018-01-18 ENCOUNTER — Ambulatory Visit (HOSPITAL_COMMUNITY)
Admission: RE | Admit: 2018-01-18 | Discharge: 2018-01-18 | Disposition: A | Discharge status: Home / Self Care | Payer: BLUE CROSS/BLUE SHIELD | Source: Ambulatory Visit | Attending: Pulmonary Disease | Admitting: Pulmonary Disease

## 2018-01-18 DIAGNOSIS — R932 Abnormal findings on diagnostic imaging of liver and biliary tract: Secondary | ICD-10-CM | POA: Diagnosis not present

## 2018-01-18 DIAGNOSIS — R110 Nausea: Secondary | ICD-10-CM | POA: Diagnosis not present

## 2018-01-18 DIAGNOSIS — Z853 Personal history of malignant neoplasm of breast: Secondary | ICD-10-CM | POA: Insufficient documentation

## 2018-01-18 DIAGNOSIS — R55 Syncope and collapse: Secondary | ICD-10-CM

## 2018-01-18 DIAGNOSIS — K7689 Other specified diseases of liver: Secondary | ICD-10-CM | POA: Diagnosis not present

## 2018-01-18 DIAGNOSIS — Z9049 Acquired absence of other specified parts of digestive tract: Secondary | ICD-10-CM | POA: Diagnosis not present

## 2018-01-18 DIAGNOSIS — Z9221 Personal history of antineoplastic chemotherapy: Secondary | ICD-10-CM | POA: Diagnosis not present

## 2018-01-18 NOTE — Progress Notes (Signed)
*  PRELIMINARY RESULTS* Echocardiogram 2D Echocardiogram has been performed.  Vanessa Neal 01/18/2018, 9:10 AM

## 2018-02-22 ENCOUNTER — Ambulatory Visit: Payer: BLUE CROSS/BLUE SHIELD | Admitting: Cardiology

## 2018-03-28 ENCOUNTER — Other Ambulatory Visit (HOSPITAL_COMMUNITY): Payer: Self-pay | Admitting: Oncology

## 2018-04-01 ENCOUNTER — Ambulatory Visit (HOSPITAL_COMMUNITY)
Admission: RE | Admit: 2018-04-01 | Discharge: 2018-04-01 | Disposition: A | Discharge status: Home / Self Care | Payer: BLUE CROSS/BLUE SHIELD | Source: Ambulatory Visit | Attending: Adult Health | Admitting: Adult Health

## 2018-04-01 ENCOUNTER — Encounter (HOSPITAL_COMMUNITY): Payer: Self-pay

## 2018-04-01 DIAGNOSIS — Z1231 Encounter for screening mammogram for malignant neoplasm of breast: Secondary | ICD-10-CM | POA: Insufficient documentation

## 2018-04-01 HISTORY — DX: Personal history of antineoplastic chemotherapy: Z92.21

## 2018-04-01 HISTORY — DX: Personal history of irradiation: Z92.3

## 2018-04-04 ENCOUNTER — Encounter (HOSPITAL_COMMUNITY): Payer: Self-pay | Admitting: Hematology

## 2018-04-04 ENCOUNTER — Other Ambulatory Visit (HOSPITAL_COMMUNITY): Payer: Self-pay | Admitting: Hematology

## 2018-04-04 ENCOUNTER — Inpatient Hospital Stay (HOSPITAL_COMMUNITY): Payer: BLUE CROSS/BLUE SHIELD | Attending: Hematology | Admitting: Hematology

## 2018-04-04 ENCOUNTER — Other Ambulatory Visit: Payer: Self-pay

## 2018-04-04 VITALS — BP 149/85 | HR 66 | Temp 98.2°F | Resp 20 | Wt 186.3 lb

## 2018-04-04 DIAGNOSIS — M8580 Other specified disorders of bone density and structure, unspecified site: Secondary | ICD-10-CM

## 2018-04-04 DIAGNOSIS — Z79899 Other long term (current) drug therapy: Secondary | ICD-10-CM | POA: Diagnosis not present

## 2018-04-04 DIAGNOSIS — C50911 Malignant neoplasm of unspecified site of right female breast: Secondary | ICD-10-CM | POA: Insufficient documentation

## 2018-04-04 DIAGNOSIS — Z90710 Acquired absence of both cervix and uterus: Secondary | ICD-10-CM | POA: Diagnosis not present

## 2018-04-04 DIAGNOSIS — Z9221 Personal history of antineoplastic chemotherapy: Secondary | ICD-10-CM | POA: Diagnosis not present

## 2018-04-04 DIAGNOSIS — Z170 Estrogen receptor positive status [ER+]: Secondary | ICD-10-CM | POA: Diagnosis not present

## 2018-04-04 DIAGNOSIS — Z1231 Encounter for screening mammogram for malignant neoplasm of breast: Secondary | ICD-10-CM

## 2018-04-04 NOTE — Assessment & Plan Note (Signed)
1.  Right breast cancer: - Status post neoadjuvant chemotherapy with epirubicin, Cytoxan from 10/19/2009 through 12/27/2009 followed by 12 weekly doses of Taxol completed on 03/28/2010 - Right lumpectomy on 04/13/2010, ER/PR positive, HER-2 negative, 1.1 cm IDC, grade 2, 3 out of 3 lymph nodes negative, pT1c,pN0, ER/PR positive and HER-2 negative, Ki-67 of 85% - Radiation therapy to the right breast completed on 07/04/2010 -Tamoxifen from 07/05/2010 through 08/18/2014 followed by Aromasin for 1 year 3 months, BCI test showing no improvement for extended therapy - Today's physical examination does not reveal any palpable masses.  Right breast lumpectomy site is within normal limits.  Her blood work was also within normal limits.  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 -I have reviewed her bilateral screening mammogram dated 04/01/2018 which was BI-RADS 1. -We will see her back in 1 year for follow-up.  We will arrange for the next year's mammogram.  2.  Osteopenia: -Last DEXA scan showed T score of -1.8 in the left femoral neck on 09/14/2016. - Her DEXA scan on 08/31/2014 shows T score of -1.8 in the left femoral neck.  She was given 3 doses of Prolia, last dose on 09/29/2015. -She was told to continue taking calcium and vitamin D.  She was also told to do weightbearing exercises.

## 2018-04-04 NOTE — Progress Notes (Signed)
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 Pablo Ledger at [facility]    07/05/2010 - 08/18/2014 Chemotherapy    Tamoxifen 20 mg daily    08/19/2014 -  Anti-estrogen oral therapy    Aromasin daily    09/29/2014 Survivorship    Prolia started for osteopenia in the setting of Aromatase inhibitor.    10/08/2014 Pathology Results    BCI- low risk of late recurrence (2.7% btweenyrs 5-10), high likelihood of benefit from extended endocrine therapy, high relative risk reduction (16.5% absolute benefit), low risk of overall recurrence (4.8% btween yrs 0-10), and high benefit from EET.      CANCER STAGING: Cancer Staging Infiltrating ductal carcinoma of right female breast [facility]) Staging form: Breast, AJCC 7th Edition - Clinical: Stage II A - Signed by Baird Cancer, PA on 02/06/2012    INTERVAL HISTORY:  Vanessa Neal 57 y.o. female returns for routine follow-up for right breast cancer. Patient is here today and doing well. She has no complaints at this time. She is still working full time and remains active. She has bilateral lower extremity edema that is stable she has had it on and off for years. She denies any nausea,vomiting, or diarrhea.  Denies any new pains or lumps present. She reports her appetite at 100% and she has no problems maintaining her weight. Her energy level is 75%.     REVIEW OF SYSTEMS:  Review of Systems  All other systems reviewed and are negative.    PAST MEDICAL/SURGICAL HISTORY:  Past Medical History:  Diagnosis Date  . BRCA negative 02/14/2013  . Breast cancer ([place])   . Breast disorder    cancer  . Fecal occult blood test positive 04/29/2014  . Infiltrating ductal carcinoma of right female breast ([place]) 01/04/2011  . Ovarian cyst   . Personal history of chemotherapy   . Personal history of radiation therapy   . Right thyroid nodule    Past Surgical History:  Procedure Laterality Date  . ABDOMINAL HYSTERECTOMY N/A 06/16/2014   Procedure: HYSTERECTOMY ABDOMINAL;  Surgeon: Jonnie Kind, MD;  Location: [facility] ORS;  Service: Gynecology;  Laterality: N/A;  . BREAST CYST ASPIRATION  01/2012  . BREAST CYST EXCISION    . BREAST LUMPECTOMY     right  . CESAREAN SECTION     X 2  . CHOLECYSTECTOMY    . COLONOSCOPY  03/11/2012   Procedure: COLONOSCOPY;  Surgeon: Danie Binder, MD;  Location: [facility] ENDO SUITE;  Service: Endoscopy;  Laterality: N/A;  9:30 AM-changed to 9:50 Doris notified  . LIPOMA EXCISION     from back  . SALPINGOOPHORECTOMY Bilateral 06/16/2014   Procedure: BILATERAL SALPINGO OOPHORECTOMY;  Surgeon: Jonnie Kind, MD;  Location: [facility] ORS;  Service: Gynecology;  Laterality: Bilateral;  . SCAR REVISION N/A 06/16/2014   Procedure: SCAR REVISION;  Surgeon: Jonnie Kind, MD;  Location: [facility] ORS;  Service: Gynecology;  Laterality: N/A;     SOCIAL HISTORY:  Social History   Socioeconomic History  . Marital status: Married    Spouse name: Not on file  . Number of children: Not on file  . Years of education: Not on file  . Highest education level: Not on file  Occupational History  . Not on file  Social Needs  . Financial resource strain: Not on file  . Food insecurity:    Worry: Not  on file    Inability: Not on file  . Transportation needs:    Medical: Not on file    Non-medical: Not on file  Tobacco Use  . Smoking status: Never Smoker  . Smokeless tobacco: Never Used  Substance and Sexual Activity  . Alcohol use: No  . Drug use: No  . Sexual activity: Yes    Birth control/protection: Other-see comments, Surgical    Comment: Pt had chemo and no longer has a period.  Lifestyle  . Physical activity:    Days per week: Not on file    Minutes per session: Not on file  . Stress: Not on file  Relationships  . Social connections:    Talks on phone: Not on file    Gets together: Not on file    Attends religious service: Not on file    Active member of club or organization: Not on file    Attends meetings of clubs or organizations: Not on file    Relationship status: Not on file  . Intimate partner violence:    Fear of current or ex partner: Not on file    Emotionally abused: Not on file    Physically abused: Not on file    Forced sexual activity: Not on file  Other Topics Concern  . Not on file  Social History Narrative  . Not on file    FAMILY HISTORY:  Family History  Problem Relation Age of Onset  . Cancer Mother        breast  . Kidney failure Father   . Cancer Maternal Grandmother        colon  . Colon cancer Maternal Grandmother   . Cancer Paternal Grandmother        ovarian  . Cancer Sister        melanoma    CURRENT MEDICATIONS:  Outpatient Encounter Medications as of 04/04/2018  Medication Sig  . calcium-vitamin D (OSCAL WITH D) 500-200 MG-UNIT per tablet Take 1 tablet by mouth 2 (two) times daily.   . Multiple Vitamins-Calcium (ONE-A-DAY WOMENS PO) Take by mouth daily.  Marland Kitchen venlafaxine (EFFEXOR) 37.5 MG tablet TAKE (1) TABLET BY MOUTH THREE TIMES DAILY WITH FOOD. (Patient taking differently: half a tablet daily)   No facility-administered encounter medications on file as of 04/04/2018.     ALLERGIES:  No Known Allergies   PHYSICAL  EXAM:  ECOG Performance status: 1  Vitals:   04/04/18 0948  BP: (!) 149/85  Pulse: 66  Resp: 20  Temp: 98.2 F (36.8 C)  SpO2: 100%   Filed Weights   04/04/18 0948  Weight: 186 lb 4.8 oz (84.5 kg)    Physical Exam  Constitutional: She is oriented to person, place, and time. She appears well-developed and well-nourished.  Abdominal: Soft.  Musculoskeletal: Normal range of motion.  Neurological: She is alert and oriented to person, place, and time.  Skin: Skin is warm and dry.  Psychiatric: She has a normal mood and affect. Her behavior is normal. Judgment and thought content normal.  Breast: No palpable masses, no skin changes or nipple discharge, no adenopathy. Right breast lumpectomy site is well-healed without no palpable masses.  No palpable axillary adenopathy.  LABORATORY DATA:  I have reviewed the labs as listed.  CBC    Component Value Date/Time   WBC 6.7 12/12/2017 0917   WBC 6.8 09/27/2015 0816   RBC 3.88 12/12/2017 0917   RBC 4.04 09/27/2015 0816   HGB 13.0 12/12/2017 0917   HCT 37.0 12/12/2017 0917   PLT 282 12/12/2017 0917   MCV 95 12/12/2017 0917   MCH 33.5 (H) 12/12/2017 0917   MCH 33.9 09/27/2015 0816   MCHC 35.1 12/12/2017 0917   MCHC 34.2 09/27/2015 0816   RDW 13.9 12/12/2017 0917   LYMPHSABS 3.0 09/27/2015 0816   MONOABS 0.4 09/27/2015 0816   EOSABS 0.1 09/27/2015 0816   BASOSABS 0.0 09/27/2015 0816   CMP Latest Ref Rng & Units 12/12/2017 05/03/2017 04/05/2016  Glucose 65 - 99 mg/dL 90 89 88  BUN 6 - 24 mg/dL '15 14 16  '$ Creatinine 0.57 - 1.00 mg/dL 0.68 0.76 0.68  Sodium 134 - 144 mmol/L 138 138 140  Potassium 3.5 - 5.2 mmol/L 5.2 4.5 4.5  Chloride 96 - 106 mmol/L 102 100 101  CO2 20 - 29 mmol/L '21 24 23  '$ Calcium 8.7 - 10.2 mg/dL 9.8 9.8 9.6  Total Protein 6.0 - 8.5 g/dL 7.4 7.5 7.5  Total Bilirubin 0.0 - 1.2 mg/dL 0.4 0.5 0.5  Alkaline Phos 39 - 117 IU/L 59 71 54  AST 0 - 40 IU/L '26 28 31  '$ ALT 0 - 32 IU/L 22 30 33(H)       DIAGNOSTIC  IMAGING:  I have reviewed her mammogram dated 04/01/2018 and discussed with her.     ASSESSMENT & PLAN:   Infiltrating ductal carcinoma of right female breast ([place]) 1.  Right breast cancer: - Status post neoadjuvant chemotherapy with epirubicin, Cytoxan from 10/19/2009 through 12/27/2009 followed by 12 weekly doses of Taxol completed on 03/28/2010 - Right lumpectomy on 04/13/2010, ER/PR positive, HER-2 negative, 1.1 cm IDC, grade 2, 3 out of 3 lymph nodes negative, pT1c,pN0, ER/PR positive and HER-2 negative, Ki-67 of 85% - Radiation therapy to the right breast completed on 07/04/2010 -Tamoxifen from 07/05/2010 through 08/18/2014 followed by Aromasin for 1 year 3 months, BCI test showing no improvement for extended therapy - Today's physical examination does not reveal any palpable masses.  Right breast lumpectomy site is within normal limits.  Her blood work was also within normal limits.  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 -I have reviewed her bilateral screening mammogram dated 04/01/2018 which was BI-RADS 1. -We will see her back in 1 year for follow-up.  We will arrange for the next year's mammogram.  2.  Osteopenia: -Last DEXA scan showed T score of -1.8 in the left femoral neck on 09/14/2016. - Her DEXA scan on 08/31/2014 shows T score of -1.8 in the left femoral neck.  She was given 3 doses of Prolia, last dose on 09/29/2015. -She was told to continue taking calcium and vitamin D.  She was also told to do weightbearing exercises.      Orders placed this encounter:  Orders Placed This Encounter  Procedures  . MM  Digital Diagnostic Bilat  . CBC with Differential/Platelet  . Comprehensive metabolic panel      Derek Jack, MD [place] [phone]

## 2018-04-04 NOTE — Patient Instructions (Addendum)
[place] at [facility] Discharge Instructions  Today you saw Dr. Raliegh Ip We will see you in 1 year with your mammogram prior.  Thank you for choosing [place] at [facility] to provide your oncology and hematology care.  To afford each patient quality time with our provider, please arrive at least 15 minutes before your scheduled appointment time.   If you have a lab appointment with the [facility] please come in thru the  Main Entrance and check in at the main information desk  You need to re-schedule your appointment should you arrive 10 or more minutes late.  We strive to give you quality time with our providers, and arriving late affects you and other patients whose appointments are after yours.  Also, if you no show three or more times for appointments you may be dismissed from the clinic at the providers discretion.     Again, thank you for choosing [facility].  Our hope is that these requests will decrease the amount of time that you wait before being seen by our physicians.       _____________________________________________________________  Should you have questions after your visit to [facility], please contact our office at [phone] between the hours of 8:00 a.m. and 4:30 p.m.  Voicemails left after 4:00 p.m. will not be returned until the following business day.  For prescription refill requests, have your pharmacy contact our office and allow 72 hours.    Cancer Center Support Programs:   > Cancer Support Group  2nd Tuesday of the month 1pm-2pm, Journey Room

## 2018-07-26 DIAGNOSIS — Z853 Personal history of malignant neoplasm of breast: Secondary | ICD-10-CM | POA: Diagnosis not present

## 2018-07-26 DIAGNOSIS — R0981 Nasal congestion: Secondary | ICD-10-CM | POA: Diagnosis not present

## 2018-12-23 ENCOUNTER — Telehealth (HOSPITAL_COMMUNITY): Payer: Self-pay | Admitting: *Deleted

## 2019-01-01 DIAGNOSIS — R7301 Impaired fasting glucose: Secondary | ICD-10-CM | POA: Diagnosis not present

## 2019-01-01 DIAGNOSIS — R37 Sexual dysfunction, unspecified: Secondary | ICD-10-CM | POA: Diagnosis not present

## 2019-01-01 DIAGNOSIS — Z0001 Encounter for general adult medical examination with abnormal findings: Secondary | ICD-10-CM | POA: Diagnosis not present

## 2019-01-01 DIAGNOSIS — Z0000 Encounter for general adult medical examination without abnormal findings: Secondary | ICD-10-CM | POA: Diagnosis not present

## 2019-01-02 DIAGNOSIS — I10 Essential (primary) hypertension: Secondary | ICD-10-CM | POA: Diagnosis not present

## 2019-01-02 DIAGNOSIS — R944 Abnormal results of kidney function studies: Secondary | ICD-10-CM | POA: Diagnosis not present

## 2019-01-02 DIAGNOSIS — Z0001 Encounter for general adult medical examination with abnormal findings: Secondary | ICD-10-CM | POA: Diagnosis not present

## 2019-01-02 DIAGNOSIS — R0981 Nasal congestion: Secondary | ICD-10-CM | POA: Diagnosis not present

## 2019-01-07 ENCOUNTER — Other Ambulatory Visit: Payer: BLUE CROSS/BLUE SHIELD | Admitting: Adult Health

## 2019-02-01 DIAGNOSIS — I10 Essential (primary) hypertension: Secondary | ICD-10-CM | POA: Diagnosis not present

## 2019-02-21 DIAGNOSIS — M25775 Osteophyte, left foot: Secondary | ICD-10-CM | POA: Diagnosis not present

## 2019-02-21 DIAGNOSIS — L600 Ingrowing nail: Secondary | ICD-10-CM | POA: Diagnosis not present

## 2019-02-21 DIAGNOSIS — M25774 Osteophyte, right foot: Secondary | ICD-10-CM | POA: Diagnosis not present

## 2019-04-04 ENCOUNTER — Other Ambulatory Visit (HOSPITAL_COMMUNITY): Payer: Self-pay

## 2019-04-04 DIAGNOSIS — C50911 Malignant neoplasm of unspecified site of right female breast: Secondary | ICD-10-CM

## 2019-04-07 ENCOUNTER — Inpatient Hospital Stay (HOSPITAL_COMMUNITY): Payer: BC Managed Care – PPO | Attending: Hematology

## 2019-04-07 ENCOUNTER — Other Ambulatory Visit: Payer: Self-pay

## 2019-04-07 ENCOUNTER — Ambulatory Visit (HOSPITAL_COMMUNITY)
Admission: RE | Admit: 2019-04-07 | Discharge: 2019-04-07 | Disposition: A | Discharge status: Home / Self Care | Payer: BC Managed Care – PPO | Source: Ambulatory Visit | Attending: Hematology | Admitting: Hematology

## 2019-04-07 DIAGNOSIS — Z1231 Encounter for screening mammogram for malignant neoplasm of breast: Secondary | ICD-10-CM | POA: Diagnosis not present

## 2019-04-07 DIAGNOSIS — C50911 Malignant neoplasm of unspecified site of right female breast: Secondary | ICD-10-CM

## 2019-04-07 DIAGNOSIS — M8580 Other specified disorders of bone density and structure, unspecified site: Secondary | ICD-10-CM | POA: Diagnosis not present

## 2019-04-07 DIAGNOSIS — Z853 Personal history of malignant neoplasm of breast: Secondary | ICD-10-CM | POA: Insufficient documentation

## 2019-04-07 LAB — CBC WITH DIFFERENTIAL/PLATELET
Abs Immature Granulocytes: 0.01 10*3/uL (ref 0.00–0.07)
Basophils Absolute: 0 10*3/uL (ref 0.0–0.1)
Basophils Relative: 0 %
Eosinophils Absolute: 0.2 10*3/uL (ref 0.0–0.5)
Eosinophils Relative: 3 %
HCT: 39.9 % (ref 36.0–46.0)
Hemoglobin: 13.4 g/dL (ref 12.0–15.0)
Immature Granulocytes: 0 %
Lymphocytes Relative: 50 %
Lymphs Abs: 3.5 10*3/uL (ref 0.7–4.0)
MCH: 34.3 pg — ABNORMAL HIGH (ref 26.0–34.0)
MCHC: 33.6 g/dL (ref 30.0–36.0)
MCV: 102 fL — ABNORMAL HIGH (ref 80.0–100.0)
Monocytes Absolute: 0.5 10*3/uL (ref 0.1–1.0)
Monocytes Relative: 7 %
Neutro Abs: 2.8 10*3/uL (ref 1.7–7.7)
Neutrophils Relative %: 40 %
Platelets: 291 10*3/uL (ref 150–400)
RBC: 3.91 MIL/uL (ref 3.87–5.11)
RDW: 12.5 % (ref 11.5–15.5)
WBC: 7 10*3/uL (ref 4.0–10.5)
nRBC: 0 % (ref 0.0–0.2)

## 2019-04-07 LAB — COMPREHENSIVE METABOLIC PANEL
ALT: 36 U/L (ref 0–44)
AST: 32 U/L (ref 15–41)
Albumin: 4.1 g/dL (ref 3.5–5.0)
Alkaline Phosphatase: 45 U/L (ref 38–126)
Anion gap: 9 (ref 5–15)
BUN: 20 mg/dL (ref 6–20)
CO2: 26 mmol/L (ref 22–32)
Calcium: 8.9 mg/dL (ref 8.9–10.3)
Chloride: 101 mmol/L (ref 98–111)
Creatinine, Ser: 0.73 mg/dL (ref 0.44–1.00)
GFR calc Af Amer: >60 mL/min (ref >60)
GFR calc non Af Amer: >60 mL/min (ref >60)
Glucose, Bld: 125 mg/dL — ABNORMAL HIGH (ref 70–99)
Potassium: 3.5 mmol/L (ref 3.5–5.1)
Sodium: 136 mmol/L (ref 135–145)
Total Bilirubin: 0.7 mg/dL (ref 0.3–1.2)
Total Protein: 7.9 g/dL (ref 6.5–8.1)

## 2019-04-07 MED ORDER — LANREOTIDE ACETATE 120 MG/0.5ML ~~LOC~~ SOLN
SUBCUTANEOUS | Status: AC
  Filled 2019-04-07: qty 120

## 2019-04-08 ENCOUNTER — Other Ambulatory Visit (HOSPITAL_COMMUNITY): Payer: BLUE CROSS/BLUE SHIELD

## 2019-04-10 ENCOUNTER — Encounter (HOSPITAL_COMMUNITY): Payer: Self-pay | Admitting: Hematology

## 2019-04-10 ENCOUNTER — Inpatient Hospital Stay (HOSPITAL_BASED_OUTPATIENT_CLINIC_OR_DEPARTMENT_OTHER): Payer: BC Managed Care – PPO | Admitting: Hematology

## 2019-04-10 ENCOUNTER — Other Ambulatory Visit: Payer: Self-pay

## 2019-04-10 VITALS — BP 136/84 | HR 76 | Temp 97.8°F | Resp 18 | Wt 193.8 lb

## 2019-04-10 DIAGNOSIS — M8580 Other specified disorders of bone density and structure, unspecified site: Secondary | ICD-10-CM | POA: Diagnosis not present

## 2019-04-10 DIAGNOSIS — Z1231 Encounter for screening mammogram for malignant neoplasm of breast: Secondary | ICD-10-CM | POA: Diagnosis not present

## 2019-04-10 DIAGNOSIS — C50911 Malignant neoplasm of unspecified site of right female breast: Secondary | ICD-10-CM | POA: Diagnosis not present

## 2019-04-10 DIAGNOSIS — Z780 Asymptomatic menopausal state: Secondary | ICD-10-CM

## 2019-04-10 DIAGNOSIS — Z853 Personal history of malignant neoplasm of breast: Secondary | ICD-10-CM | POA: Diagnosis not present

## 2019-04-10 NOTE — Progress Notes (Signed)
[place], [place] [address]   CLINIC:  Medical Oncology/Hematology  PCP:  Celene Squibb, MD [place] [address] [phone]   REASON FOR VISIT:  Follow-up for right breast cancer.  CURRENT THERAPY: Observation.  BRIEF ONCOLOGIC HISTORY:  Oncology History  Infiltrating ductal carcinoma of right female breast ([place])  09/22/2009 Initial Diagnosis   Infiltrating ductal carcinoma of right female breast by needle core biopsy   10/19/2009 - 12/27/2009 Chemotherapy   Epirubicin/Cytoxan   01/10/2010 - 03/28/2010 Chemotherapy   Taxol x 12   04/13/2010 Surgery   Right breast lumpectomy with SLN biopsy   05/06/2010 - 07/04/2010 Radiation Therapy   Dr. Pablo Ledger at [facility]   07/05/2010 - 08/18/2014 Chemotherapy   Tamoxifen 20 mg daily   08/19/2014 -  Anti-estrogen oral therapy   Aromasin daily   09/29/2014 Survivorship   Prolia started for osteopenia in the setting of Aromatase inhibitor.   10/08/2014 Pathology Results   BCI- low risk of late recurrence (2.7% btweenyrs 5-10), high likelihood of benefit from extended endocrine therapy, high relative risk reduction (16.5% absolute benefit), low risk of overall recurrence (4.8% btween yrs 0-10), and high benefit from EET.      CANCER STAGING: Cancer Staging Infiltrating ductal carcinoma of right female breast [facility]) Staging form: Breast, AJCC 7th Edition - Clinical: Stage II A - Signed by Baird Cancer, PA on 02/06/2012    INTERVAL HISTORY:  Vanessa Neal 58 y.o. female seen for follow-up of right breast cancer.  She is working full-time job as a Occupational psychologist.  Appetite and energy levels are 100%.  Denies any new onset pains.  Denies any fevers, night sweats or weight loss in the last 6 months.  No recurrent infections or hospitalizations.    REVIEW OF SYSTEMS:  Review of Systems  All other systems reviewed and are negative.    PAST  MEDICAL/SURGICAL HISTORY:  Past Medical History:  Diagnosis Date  . BRCA negative 02/14/2013  . Breast cancer ([place])   . Breast disorder    cancer  . Fecal occult blood test positive 04/29/2014  . Infiltrating ductal carcinoma of right female breast ([place]) 01/04/2011  . Ovarian cyst   . Personal history of chemotherapy   . Personal history of radiation therapy   . Right thyroid nodule    Past Surgical History:  Procedure Laterality Date  . ABDOMINAL HYSTERECTOMY N/A 06/16/2014   Procedure: HYSTERECTOMY ABDOMINAL;  Surgeon: Jonnie Kind, MD;  Location: [facility] ORS;  Service: Gynecology;  Laterality: N/A;  . BREAST CYST ASPIRATION  01/2012  . BREAST CYST EXCISION    . BREAST LUMPECTOMY     right  . CESAREAN SECTION     X 2  . CHOLECYSTECTOMY    . COLONOSCOPY  03/11/2012   Procedure: COLONOSCOPY;  Surgeon: Danie Binder, MD;  Location: [facility] ENDO SUITE;  Service: Endoscopy;  Laterality: N/A;  9:30 AM-changed to 9:50 Doris notified  . LIPOMA EXCISION     from back  . SALPINGOOPHORECTOMY Bilateral 06/16/2014   Procedure: BILATERAL SALPINGO OOPHORECTOMY;  Surgeon: Jonnie Kind, MD;  Location: [facility] ORS;  Service: Gynecology;  Laterality: Bilateral;  . SCAR REVISION N/A 06/16/2014   Procedure: SCAR REVISION;  Surgeon: Jonnie Kind, MD;  Location: [facility] ORS;  Service: Gynecology;  Laterality: N/A;     SOCIAL HISTORY:  Social History   Socioeconomic History  . Marital status: Married  Spouse name: Not on file  . Number of children: Not on file  . Years of education: Not on file  . Highest education level: Not on file  Occupational History  . Not on file  Social Needs  . Financial resource strain: Not on file  . Food insecurity    Worry: Not on file    Inability: Not on file  . Transportation needs    Medical: Not on file    Non-medical: Not on file  Tobacco Use  . Smoking status: Never Smoker  . Smokeless tobacco: Never Used  Substance and Sexual Activity  . Alcohol use: No  .  Drug use: No  . Sexual activity: Yes    Birth control/protection: Other-see comments, Surgical    Comment: Pt had chemo and no longer has a period.  Lifestyle  . Physical activity    Days per week: Not on file    Minutes per session: Not on file  . Stress: Not on file  Relationships  . Social Herbalist on phone: Not on file    Gets together: Not on file    Attends religious service: Not on file    Active member of club or organization: Not on file    Attends meetings of clubs or organizations: Not on file    Relationship status: Not on file  . Intimate partner violence    Fear of current or ex partner: Not on file    Emotionally abused: Not on file    Physically abused: Not on file    Forced sexual activity: Not on file  Other Topics Concern  . Not on file  Social History Narrative  . Not on file    FAMILY HISTORY:  Family History  Problem Relation Age of Onset  . Cancer Mother        breast  . Kidney failure Father   . Cancer Maternal Grandmother        colon  . Colon cancer Maternal Grandmother   . Cancer Paternal Grandmother        ovarian  . Cancer Sister        melanoma    CURRENT MEDICATIONS:  Outpatient Encounter Medications as of 04/10/2019  Medication Sig  . aspirin EC 81 MG tablet Take 81 mg by mouth daily.  . calcium-vitamin D (OSCAL WITH D) 500-200 MG-UNIT per tablet Take 1 tablet by mouth 2 (two) times daily.   . hydrochlorothiazide (HYDRODIURIL) 12.5 MG tablet Take 12.5 mg by mouth daily.  . Multiple Vitamins-Calcium (ONE-A-DAY WOMENS PO) Take by mouth daily.  Marland Kitchen venlafaxine (EFFEXOR) 37.5 MG tablet TAKE (1) TABLET BY MOUTH THREE TIMES DAILY WITH FOOD. (Patient taking differently: half a tablet daily)   No facility-administered encounter medications on file as of 04/10/2019.     ALLERGIES:  No Known Allergies   PHYSICAL EXAM:  ECOG Performance status: 0  Vitals:   04/10/19 0824  BP: 136/84  Pulse: 76  Resp: 18  Temp: 97.8 F  (36.6 C)  SpO2: 100%   Filed Weights   04/10/19 0824  Weight: 193 lb 12.8 oz (87.9 kg)    Physical Exam Vitals signs reviewed.  Constitutional:      Appearance: Normal appearance.  Cardiovascular:     Rate and Rhythm: Normal rate and regular rhythm.     Heart sounds: Normal heart sounds.  Pulmonary:     Effort: Pulmonary effort is normal.     Breath sounds: Normal breath sounds.  Abdominal:     General: There is no distension.     Palpations: Abdomen is soft. There is no mass.  Musculoskeletal:        General: No swelling.  Lymphadenopathy:     Cervical: No cervical adenopathy.  Skin:    General: Skin is warm.  Neurological:     General: No focal deficit present.     Mental Status: She is alert and oriented to person, place, and time.  Psychiatric:        Mood and Affect: Mood normal.        Behavior: Behavior normal.      LABORATORY DATA:  I have reviewed the labs as listed.  CBC    Component Value Date/Time   WBC 7.0 04/07/2019 0818   RBC 3.91 04/07/2019 0818   HGB 13.4 04/07/2019 0818   HGB 13.0 12/12/2017 0917   HCT 39.9 04/07/2019 0818   HCT 37.0 12/12/2017 0917   PLT 291 04/07/2019 0818   PLT 282 12/12/2017 0917   MCV 102.0 (H) 04/07/2019 0818   MCV 95 12/12/2017 0917   MCH 34.3 (H) 04/07/2019 0818   MCHC 33.6 04/07/2019 0818   RDW 12.5 04/07/2019 0818   RDW 13.9 12/12/2017 0917   LYMPHSABS 3.5 04/07/2019 0818   MONOABS 0.5 04/07/2019 0818   EOSABS 0.2 04/07/2019 0818   BASOSABS 0.0 04/07/2019 0818   CMP Latest Ref Rng & Units 04/07/2019 12/12/2017 05/03/2017  Glucose 70 - 99 mg/dL 125(H) 90 89  BUN 6 - 20 mg/dL _0 Creatinine 0.44 - 1.00 mg/dL 0.73 0.68 0.76  Sodium 135 - 145 mmol/L 136 138 138  Potassium 3.5 - 5.1 mmol/L 3.5 5.2 4.5  Chloride 98 - 111 mmol/L 101 102 100  CO2 22 - 32 mmol/L _1 Calcium 8.9 - 10.3 mg/dL 8.9 9.8 9.8  Total Protein 6.5 - 8.1 g/dL 7.9 7.4 7.5  Total Bilirubin 0.3 - 1.2 mg/dL 0.7 0.4 0.5  Alkaline  Phos 38 - 126 U/L 45 59 71  AST 15 - 41 U/L 32 26 28  ALT 0 - 44 U/L 36 22 30       DIAGNOSTIC IMAGING:  I have independently reviewed the scans and discussed with the patient.     ASSESSMENT & PLAN:   Infiltrating ductal carcinoma of right female breast ([place]) 1.  Right breast cancer: - Status post neoadjuvant chemotherapy with epirubicin, Cytoxan from 10/19/2009 through 12/27/2009 followed by 12 weekly doses of Taxol completed on 03/28/2010 - Right lumpectomy on 04/13/2010, ER/PR positive, HER-2 negative, 1.1 cm IDC, grade 2, 3 out of 3 lymph nodes negative, pT1c,pN0, ER/PR positive and HER-2 negative, Ki-67 of 85% - Radiation therapy to the right breast completed on 07/04/2010 -Tamoxifen from 07/05/2010 through 08/18/2014 followed by Aromasin for 1 year 3 months, BCI test showing no improvement for extended therapy -Physical examination today did not reveal any palpable masses.  Right breast lumpectomy site is within normal limits.  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 -We reviewed mammogram dated 04/07/2019 which was BI-RADS Category 2. -I will see her back in 1 year for follow-up.  2.  Osteopenia: -Last DEXA scan showed T score of -1.8 in the left femoral neck on 09/14/2016. - Her DEXA scan on 08/31/2014 shows T score of -1.8 in the left femoral neck.  She was given 3 doses of Prolia, last dose on 09/29/2015. -She will continue calcium and vitamin D.  I  plan to repeat DEXA scan prior to next visit.  I will also repeat vitamin D level.      Orders placed this encounter:  Orders Placed This Encounter  Procedures  . MM 3D SCREEN BREAST BILATERAL  . DG Bone Density  . CBC with Differential/Platelet  . Comprehensive metabolic panel  . Vitamin D 25 hydroxy      Derek Jack, MD [place] [phone]

## 2019-04-10 NOTE — Patient Instructions (Addendum)
[place] at [facility] Discharge Instructions  You were seen today by Dr. Delton Coombes. He went over your recent test results. He will see you back in 1 year for labs, mammogram, bone density and follow up.   Thank you for choosing [place] at [facility] to provide your oncology and hematology care.  To afford each patient quality time with our provider, please arrive at least 15 minutes before your scheduled appointment time.   If you have a lab appointment with the [facility] please come in thru the  Main Entrance and check in at the main information desk  You need to re-schedule your appointment should you arrive 10 or more minutes late.  We strive to give you quality time with our providers, and arriving late affects you and other patients whose appointments are after yours.  Also, if you no show three or more times for appointments you may be dismissed from the clinic at the providers discretion.     Again, thank you for choosing [facility].  Our hope is that these requests will decrease the amount of time that you wait before being seen by our physicians.       _____________________________________________________________  Should you have questions after your visit to [facility], please contact our office at [phone] between the hours of 8:00 a.m. and 4:30 p.m.  Voicemails left after 4:00 p.m. will not be returned until the following business day.  For prescription refill requests, have your pharmacy contact our office and allow 72 hours.    Cancer Center Support Programs:   > Cancer Support Group  2nd Tuesday of the month 1pm-2pm, Journey Room

## 2019-04-10 NOTE — Assessment & Plan Note (Signed)
1.  Right breast cancer: - Status post neoadjuvant chemotherapy with epirubicin, Cytoxan from 10/19/2009 through 12/27/2009 followed by 12 weekly doses of Taxol completed on 03/28/2010 - Right lumpectomy on 04/13/2010, ER/PR positive, HER-2 negative, 1.1 cm IDC, grade 2, 3 out of 3 lymph nodes negative, pT1c,pN0, ER/PR positive and HER-2 negative, Ki-67 of 85% - Radiation therapy to the right breast completed on 07/04/2010 -Tamoxifen from 07/05/2010 through 08/18/2014 followed by Aromasin for 1 year 3 months, BCI test showing no improvement for extended therapy -Physical examination today did not reveal any palpable masses.  Right breast lumpectomy site is within normal limits.  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 -We reviewed mammogram dated 04/07/2019 which was BI-RADS Category 2. -I will see her back in 1 year for follow-up.  2.  Osteopenia: -Last DEXA scan showed T score of -1.8 in the left femoral neck on 09/14/2016. - Her DEXA scan on 08/31/2014 shows T score of -1.8 in the left femoral neck.  She was given 3 doses of Prolia, last dose on 09/29/2015. -She will continue calcium and vitamin D.  I plan to repeat DEXA scan prior to next visit.  I will also repeat vitamin D level.

## 2019-08-05 ENCOUNTER — Other Ambulatory Visit (HOSPITAL_COMMUNITY): Payer: Self-pay | Admitting: Oncology

## 2019-09-16 DIAGNOSIS — R7301 Impaired fasting glucose: Secondary | ICD-10-CM | POA: Diagnosis not present

## 2019-09-16 DIAGNOSIS — I10 Essential (primary) hypertension: Secondary | ICD-10-CM | POA: Diagnosis not present

## 2019-09-16 DIAGNOSIS — E782 Mixed hyperlipidemia: Secondary | ICD-10-CM | POA: Diagnosis not present

## 2019-09-16 DIAGNOSIS — E559 Vitamin D deficiency, unspecified: Secondary | ICD-10-CM | POA: Diagnosis not present

## 2019-09-16 DIAGNOSIS — E6609 Other obesity due to excess calories: Secondary | ICD-10-CM | POA: Diagnosis not present

## 2019-09-22 DIAGNOSIS — R944 Abnormal results of kidney function studies: Secondary | ICD-10-CM | POA: Diagnosis not present

## 2019-09-22 DIAGNOSIS — R0981 Nasal congestion: Secondary | ICD-10-CM | POA: Diagnosis not present

## 2019-09-22 DIAGNOSIS — I10 Essential (primary) hypertension: Secondary | ICD-10-CM | POA: Diagnosis not present

## 2019-09-22 DIAGNOSIS — Z853 Personal history of malignant neoplasm of breast: Secondary | ICD-10-CM | POA: Diagnosis not present

## 2019-09-24 ENCOUNTER — Encounter: Payer: Self-pay | Admitting: Gastroenterology

## 2019-10-13 ENCOUNTER — Encounter: Payer: Self-pay | Admitting: Nurse Practitioner

## 2019-10-13 ENCOUNTER — Telehealth (INDEPENDENT_AMBULATORY_CARE_PROVIDER_SITE_OTHER): Payer: BC Managed Care – PPO | Admitting: Nurse Practitioner

## 2019-10-13 ENCOUNTER — Telehealth: Payer: Self-pay | Admitting: *Deleted

## 2019-10-13 ENCOUNTER — Other Ambulatory Visit: Payer: Self-pay

## 2019-10-13 DIAGNOSIS — R195 Other fecal abnormalities: Secondary | ICD-10-CM | POA: Diagnosis not present

## 2019-10-13 DIAGNOSIS — K921 Melena: Secondary | ICD-10-CM

## 2019-10-13 MED ORDER — NA SULFATE-K SULFATE-MG SULF 17.5-3.13-1.6 GM/177ML PO SOLN: 1.0000 | Freq: Once | ORAL | 0 refills | Status: AC

## 2019-10-13 NOTE — Progress Notes (Signed)
Referring Provider: Celene Squibb, MD Primary Care Physician:  Celene Squibb, MD Primary GI:  Dr. Gala Romney (in the absence of Dr. Oneida Alar); pending Dr. Abbey Chatters  NOTE: Service was provided via telemedicine and was requested by the patient due to COVID-19 pandemic.  Patient Location: Home  Provider Location: [place] office  Reason for Phone Visit: Change in bowels: loose stools, "GI irritation"  Persons present on the phone encounter, with roles: Patient, myself (provider), Zara Council (updated meds and allergies)  Total time (minutes) spent on medical discussion: 20 minutes  Due to COVID-19, visit was conducted using the virtual method noted. Visit was requested by patient.  I connected with [name] on 10/13/19 at  3:00 PM EDT by video visit and verified that I am speaking with the correct person using two identifiers.   I discussed the limitations, risks, security and privacy concerns of performing an evaluation and management service by telephone and the availability of in person appointments. I also discussed with the patient that there may be a patient responsible charge related to this service. The patient expressed understanding and agreed to proceed.  Chief Complaint  Patient presents with  . Diarrhea    occ  . Abdominal Pain    left lower abd approx 6 weeks ago, had explosive diarrhea; better now    HPI:   Vanessa Neal is a 59 y.o. female who presents for virtual visit regarding: Referral from primary care for loose stools" GI irritation" for consult for colonoscopy.    Previous colonoscopy completed 03/11/2012 that was essentially normal other than small internal hemorrhoids.  Recommended repeat colonoscopy in 10 years.  Today she states she's doing ok overall. She had some LLQ/left side abdominal discomfort/pain and explosive diarrhea about 6 weeks ago. This lasted about a few days and thought she may have had diverticulitis. She has been watching what she eats. The pain  improved after 1-2 days but still had some tenderness for about a week after that, and eventually resolved. She also had some nausea, which is also much better. Stools now are still a bit loose, not watery. Most stools are loose, typically has 3 stools a day. Often has to go back to the bathroom soon after.These symptoms have been ongoing for a couple years. She has been asking about a colonoscopy but has been told she isn't due for it. Has a history of breast cancer and has been cancer free for almost 10 years. Has had 1-2 episodes of scant tissue hematochezia with diarrhea, thinks it's due to hemorrhoids. Last episode a couple weeks ago. Denies melena, fever, chills, unintentional weight loss. Denies URI or flu-like symptoms. Denies loss of sense of taste or smell. The patient has received COVID-19 vaccination(s). Denies chest pain, dyspnea, dizziness, lightheadedness, syncope, near syncope. Denies any other upper or lower GI symptoms.   She has started Align probiotic by PCP which she feels has helped. She does not have a gallbladder. She mentioned at the end that she took a week of leftover Cipro and not sure if that is what helped her.  Past Medical History:  Diagnosis Date  . BRCA negative 02/14/2013  . Breast cancer ([place])   . Breast disorder    cancer  . Fecal occult blood test positive 04/29/2014  . 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 Ovarian cyst   . Personal history of chemotherapy   . Personal history of radiation therapy   . Right thyroid nodule  Past Surgical History:  Procedure Laterality Date  . ABDOMINAL HYSTERECTOMY N/A 06/16/2014   Procedure: HYSTERECTOMY ABDOMINAL;  Surgeon: Jonnie Kind, MD;  Location: [facility];  Service: Gynecology;  Laterality: N/A;  . BREAST CYST ASPIRATION  01/2012  . BREAST CYST EXCISION    . BREAST LUMPECTOMY     right  . CESAREAN SECTION     X 2  . CHOLECYSTECTOMY    . COLONOSCOPY  03/11/2012   Procedure:  COLONOSCOPY;  Surgeon: Danie Binder, MD;  Location: [facility];  Service: Endoscopy;  Laterality: N/A;  9:30 AM-changed to 9:50 Doris notified  . LIPOMA EXCISION     from back  . SALPINGOOPHORECTOMY Bilateral 06/16/2014   Procedure: BILATERAL SALPINGO OOPHORECTOMY;  Surgeon: Jonnie Kind, MD;  Location: [facility];  Service: Gynecology;  Laterality: Bilateral;  . SCAR REVISION N/A 06/16/2014   Procedure: SCAR REVISION;  Surgeon: Jonnie Kind, MD;  Location: [facility];  Service: Gynecology;  Laterality: N/A;    Current Outpatient Medications  Medication Sig Dispense Refill  . aspirin EC 81 MG tablet Take 81 mg by mouth daily.    . calcium-vitamin D (OSCAL WITH D) 500-200 MG-UNIT per tablet Take 1 tablet by mouth 2 (two) times daily.     . Cholecalciferol (VITAMIN D3) 125 MCG (5000 UT) TABS Take by mouth daily.    Marland Kitchen FARXIGA 5 MG TABS tablet Take 5 mg by mouth daily.    . hydrochlorothiazide (HYDRODIURIL) 12.5 MG tablet Take 12.5 mg by mouth daily.    . Multiple Vitamins-Calcium (ONE-A-DAY WOMENS PO) Take by mouth daily.    . Probiotic Product (ALIGN PO) Take by mouth daily.    Marland Kitchen venlafaxine (EFFEXOR) 37.5 MG tablet TAKE (1) TABLET BY MOUTH THREE TIMES DAILY WITH FOOD. (Patient taking differently: Take 37.5 mg by mouth daily. ) 90 tablet 5   No current facility-administered medications for this visit.    Allergies as of 10/13/2019  . (No Known Allergies)    Family History  Problem Relation Age of Onset  . Cancer Mother        breast  . Kidney failure Father   . Cancer Maternal Grandmother        colon  . Colon cancer Maternal Grandmother   . Cancer Paternal Grandmother        ovarian  . Cancer Sister        melanoma    Social History   Socioeconomic History  . Marital status: Married    Spouse name: Not on file  . Number of children: Not on file  . Years of education: Not on file  . Highest education level: Not on file  Occupational History  . Not on file  Tobacco  Use  . Smoking status: Never Smoker  . Smokeless tobacco: Never Used  Substance and Sexual Activity  . Alcohol use: No  . Drug use: No  . Sexual activity: Yes    Birth control/protection: Other-see comments, Surgical    Comment: Pt had chemo and no longer has a period.  Other Topics Concern  . Not on file  Social History Narrative  . Not on file   Social Determinants of Health   Financial Resource Strain:   . Difficulty of Paying Living Expenses:   Food Insecurity:   . Worried About Charity fundraiser in the Last Year:   . [place] in the Last Year:   Transportation Needs:   . Lack of  Transportation (Medical):   Marland Kitchen Lack of Transportation (Non-Medical):   Physical Activity:   . Days of Exercise per Week:   . Minutes of Exercise per Session:   Stress:   . Feeling of Stress :   Social Connections:   . Frequency of Communication with Friends and Family:   . Frequency of Social Gatherings with Friends and Family:   . Attends Religious Services:   . Active Member of Clubs or Organizations:   . Attends Archivist Meetings:   Marland Kitchen Marital Status:     Review of Systems: Review of Systems  Constitutional: Negative for chills, fever, malaise/fatigue and weight loss.  HENT: Negative for congestion and sore throat.   Respiratory: Negative for cough and shortness of breath.   Cardiovascular: Negative for chest pain and palpitations.  Gastrointestinal: Negative for abdominal pain, blood in stool, diarrhea, melena, nausea and vomiting.  Musculoskeletal: Negative for joint pain and myalgias.  Skin: Negative for rash.  Neurological: Negative for dizziness and weakness.  Endo/Heme/Allergies: Does not bruise/bleed easily.  Psychiatric/Behavioral: Negative for depression. The patient is not nervous/anxious.   All other systems reviewed and are negative.   Physical Exam: Note: limited exam due to virtual visit There were no vitals taken for this visit. Physical  Exam Nursing note reviewed.  Constitutional:      General: She is not in acute distress.    Appearance: Normal appearance. She is well-developed. She is not ill-appearing, toxic-appearing or diaphoretic.  HENT:     Head: Normocephalic and atraumatic.     Nose: No congestion or rhinorrhea.  Eyes:     General: No scleral icterus. Pulmonary:     Effort: Pulmonary effort is normal. No respiratory distress.  Abdominal:     General: There is no distension.     Palpations: There is no hepatomegaly or splenomegaly.  Skin:    Coloration: Skin is not jaundiced.     Findings: No rash.  Neurological:     General: No focal deficit present.     Mental Status: She is alert and oriented to person, place, and time.  Psychiatric:        Attention and Perception: Attention normal.        Mood and Affect: Mood normal.        Speech: Speech normal.        Behavior: Behavior normal.        Thought Content: Thought content normal.        Cognition and Memory: Cognition and memory normal.

## 2019-10-13 NOTE — Telephone Encounter (Signed)
lmovm for pt to schedule TCS with RMR

## 2019-10-13 NOTE — Assessment & Plan Note (Signed)
The patient notes a couple recent episodes of scant toilet tissue hematochezia.  She thinks this is likely due to hemorrhoids.  However, her colonoscopy was last completed 8 years ago.  She has had some recent diarrhea that has been ongoing for 2 years with an acute exacerbation about 6 weeks ago that seems to have resolved back to baseline.  Still with soft/loose stools, but no watery diarrhea.  At this point I recommended she continue the probiotic that was prescribed by her primary care which seems to be helping.  She can use Imodium as needed.  I doubt acute colonic infection given that she does not appear ill today.  We will plan for colonoscopy to further evaluate.  Return for follow-up in 3 months.  Proceed with TCS with Dr. Gala Romney in the absence of Dr. Oneida Alar in near future: the risks, benefits, and alternatives have been discussed with the patient in detail. The patient states understanding and desires to proceed.  The patient is not on any anticoagulants, anxiolytics, chronic pain medications, antidepressants, antidiabetics, or iron supplements.  Denies alcohol and drug use.  Conscious sedation should be adequate for her procedure as it was for her last.

## 2019-10-13 NOTE — H&P (View-Only) (Signed)
Referring Provider: Celene Squibb, MD Primary Care Physician:  Celene Squibb, MD Primary GI:  Dr. Gala Romney (in the absence of Dr. Oneida Alar); pending Dr. Abbey Chatters  NOTE: Service was provided via telemedicine and was requested by the patient due to COVID-19 pandemic.  Patient Location: Home  Provider Location: [place] office  Reason for Phone Visit: Change in bowels: loose stools, "GI irritation"  Persons present on the phone encounter, with roles: Patient, myself (provider), Zara Council (updated meds and allergies)  Total time (minutes) spent on medical discussion: 20 minutes  Due to COVID-19, visit was conducted using the virtual method noted. Visit was requested by patient.  I connected with Vanessa Neal on 10/13/19 at  3:00 PM EDT by video visit and verified that I am speaking with the correct person using two identifiers.   I discussed the limitations, risks, security and privacy concerns of performing an evaluation and management service by telephone and the availability of in person appointments. I also discussed with the patient that there may be a patient responsible charge related to this service. The patient expressed understanding and agreed to proceed.  Chief Complaint  Patient presents with  . Diarrhea    occ  . Abdominal Pain    left lower abd approx 6 weeks ago, had explosive diarrhea; better now    HPI:   Vanessa Neal is a 59 y.o. female who presents for virtual visit regarding: Referral from primary care for loose stools" GI irritation" for consult for colonoscopy.    Previous colonoscopy completed 03/11/2012 that was essentially normal other than small internal hemorrhoids.  Recommended repeat colonoscopy in 10 years.  Today she states she's doing ok overall. She had some LLQ/left side abdominal discomfort/pain and explosive diarrhea about 6 weeks ago. This lasted about a few days and thought she may have had diverticulitis. She has been watching what she eats. The pain  improved after 1-2 days but still had some tenderness for about a week after that, and eventually resolved. She also had some nausea, which is also much better. Stools now are still a bit loose, not watery. Most stools are loose, typically has 3 stools a day. Often has to go back to the bathroom soon after.These symptoms have been ongoing for a couple years. She has been asking about a colonoscopy but has been told she isn't due for it. Has a history of breast cancer and has been cancer free for almost 10 years. Has had 1-2 episodes of scant tissue hematochezia with diarrhea, thinks it's due to hemorrhoids. Last episode a couple weeks ago. Denies melena, fever, chills, unintentional weight loss. Denies URI or flu-like symptoms. Denies loss of sense of taste or smell. The patient has received COVID-19 vaccination(s). Denies chest pain, dyspnea, dizziness, lightheadedness, syncope, near syncope. Denies any other upper or lower GI symptoms.   She has started Align probiotic by PCP which she feels has helped. She does not have a gallbladder. She mentioned at the end that she took a week of leftover Cipro and not sure if that is what helped her.  Past Medical History:  Diagnosis Date  . BRCA negative 02/14/2013  . Breast cancer ([place])   . Breast disorder    cancer  . Fecal occult blood test positive 04/29/2014  . 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 Ovarian cyst   . Personal history of chemotherapy   . Personal history of radiation therapy   . Right thyroid nodule  Past Surgical History:  Procedure Laterality Date  . ABDOMINAL HYSTERECTOMY N/A 06/16/2014   Procedure: HYSTERECTOMY ABDOMINAL;  Surgeon: Jonnie Kind, MD;  Location: [facility];  Service: Gynecology;  Laterality: N/A;  . BREAST CYST ASPIRATION  01/2012  . BREAST CYST EXCISION    . BREAST LUMPECTOMY     right  . CESAREAN SECTION     X 2  . CHOLECYSTECTOMY    . COLONOSCOPY  03/11/2012   Procedure:  COLONOSCOPY;  Surgeon: Danie Binder, MD;  Location[facility];  Service: Endoscopy;  Laterality: N/A;  9:30 AM-changed to 9:50 Doris notified  . LIPOMA EXCISION     from back  . SALPINGOOPHORECTOMY Bilateral 06/16/2014   Procedure: BILATERAL SALPINGO OOPHORECTOMY;  Surgeon: Jonnie Kind, MD;  Location: [facility];  Service: Gynecology;  Laterality: Bilateral;  . SCAR REVISION N/A 06/16/2014   Procedure: SCAR REVISION;  Surgeon: Jonnie Kind, MD;  Location: [facility];  Service: Gynecology;  Laterality: N/A;    Current Outpatient Medications  Medication Sig Dispense Refill  . aspirin EC 81 MG tablet Take 81 mg by mouth daily.    . calcium-vitamin D (OSCAL WITH D) 500-200 MG-UNIT per tablet Take 1 tablet by mouth 2 (two) times daily.     . Cholecalciferol (VITAMIN D3) 125 MCG (5000 UT) TABS Take by mouth daily.    Marland Kitchen FARXIGA 5 MG TABS tablet Take 5 mg by mouth daily.    . hydrochlorothiazide (HYDRODIURIL) 12.5 MG tablet Take 12.5 mg by mouth daily.    . Multiple Vitamins-Calcium (ONE-A-DAY WOMENS PO) Take by mouth daily.    . Probiotic Product (ALIGN PO) Take by mouth daily.    Marland Kitchen venlafaxine (EFFEXOR) 37.5 MG tablet TAKE (1) TABLET BY MOUTH THREE TIMES DAILY WITH FOOD. (Patient taking differently: Take 37.5 mg by mouth daily. ) 90 tablet 5   No current facility-administered medications for this visit.    Allergies as of 10/13/2019  . (No Known Allergies)    Family History  Problem Relation Age of Onset  . Cancer Mother        breast  . Kidney failure Father   . Cancer Maternal Grandmother        colon  . Colon cancer Maternal Grandmother   . Cancer Paternal Grandmother        ovarian  . Cancer Sister        melanoma    Social History   Socioeconomic History  . Marital status: Married    Spouse name: Not on file  . Number of children: Not on file  . Years of education: Not on file  . Highest education level: Not on file  Occupational History  . Not on file  Tobacco  Use  . Smoking status: Never Smoker  . Smokeless tobacco: Never Used  Substance and Sexual Activity  . Alcohol use: No  . Drug use: No  . Sexual activity: Yes    Birth control/protection: Other-see comments, Surgical    Comment: Pt had chemo and no longer has a period.  Other Topics Concern  . Not on file  Social History Narrative  . Not on file   Social Determinants of Health   Financial Resource Strain:   . Difficulty of Paying Living Expenses:   Food Insecurity:   . Worried About Charity fundraiser in the Last Year:   . [place] in the Last Year:   Transportation Needs:   . Lack of  Transportation (Medical):   Marland Kitchen Lack of Transportation (Non-Medical):   Physical Activity:   . Days of Exercise per Week:   . Minutes of Exercise per Session:   Stress:   . Feeling of Stress :   Social Connections:   . Frequency of Communication with Friends and Family:   . Frequency of Social Gatherings with Friends and Family:   . Attends Religious Services:   . Active Member of Clubs or Organizations:   . Attends Archivist Meetings:   Marland Kitchen Marital Status:     Review of Systems: Review of Systems  Constitutional: Negative for chills, fever, malaise/fatigue and weight loss.  HENT: Negative for congestion and sore throat.   Respiratory: Negative for cough and shortness of breath.   Cardiovascular: Negative for chest pain and palpitations.  Gastrointestinal: Negative for abdominal pain, blood in stool, diarrhea, melena, nausea and vomiting.  Musculoskeletal: Negative for joint pain and myalgias.  Skin: Negative for rash.  Neurological: Negative for dizziness and weakness.  Endo/Heme/Allergies: Does not bruise/bleed easily.  Psychiatric/Behavioral: Negative for depression. The patient is not nervous/anxious.   All other systems reviewed and are negative.   Physical Exam: Note: limited exam due to virtual visit There were no vitals taken for this visit. Physical  Exam Nursing note reviewed.  Constitutional:      General: She is not in acute distress.    Appearance: Normal appearance. She is well-developed. She is not ill-appearing, toxic-appearing or diaphoretic.  HENT:     Head: Normocephalic and atraumatic.     Nose: No congestion or rhinorrhea.  Eyes:     General: No scleral icterus. Pulmonary:     Effort: Pulmonary effort is normal. No respiratory distress.  Abdominal:     General: There is no distension.     Palpations: There is no hepatomegaly or splenomegaly.  Skin:    Coloration: Skin is not jaundiced.     Findings: No rash.  Neurological:     General: No focal deficit present.     Mental Status: She is alert and oriented to person, place, and time.  Psychiatric:        Attention and Perception: Attention normal.        Mood and Affect: Mood normal.        Speech: Speech normal.        Behavior: Behavior normal.        Thought Content: Thought content normal.        Cognition and Memory: Cognition and memory normal.

## 2019-10-13 NOTE — Assessment & Plan Note (Signed)
The patient has had chronic, ongoing soft/loose stools for the past 2 years.  She has been asking about a colonoscopy but previously told it was not time yet.  She did have an acute episode 6 weeks ago with explosive diarrhea, nausea, abdominal discomfort.  The acute episode has since resolved.  She is currently on probiotics.  Persistent soft/loose stools, although not every time.  Given bowel habit changes and hematochezia as per above, will plan for colonoscopy as noted above.  I recommended continue probiotic, Imodium as needed.  Follow-up in 3 months.

## 2019-10-13 NOTE — Telephone Encounter (Signed)
Called pt. She is scheduled for TCS with RMR 6/11 at 1:15pm. Patient is scheduled for COVID test 6/10 at 11:30am. Patient aware of testing location. Also aware will mail prep instructions to her and send Rx to pharmacy. Confirmed mailing address is correct.

## 2019-10-13 NOTE — Addendum Note (Signed)
Addended by: Cheron Every on: 10/13/2019 04:34 PM   Modules accepted: Orders

## 2019-10-13 NOTE — Patient Instructions (Signed)
Your health issues we discussed today were:   Loose stools over the past 2 years and some blood: 1. As we discussed, we will plan for colonoscopy 2. Further recommendations will follow your colonoscopy 3. Continue taking your probiotic 4. You can use Imodium, as needed for any worsening loose stools 5. Call us for any worsening or severe symptoms 6. Further recommendations will be made after your colonoscopy  Overall I recommend:  1. Continue your other current medications 2. Return for follow-up in 3 months 3. Call us if you have any questions or concerns   ---------------------------------------------------------------  I am glad you have gotten your COVID-19 vaccination!  Even though you are fully vaccinated you should continue to follow CDC and state/local guidelines.  ---------------------------------------------------------------   At [facility] we value your feedback. You may receive a survey about your visit today. Please share your experience as we strive to create trusting relationships with our patients to provide genuine, compassionate, quality care.  We appreciate your understanding and patience as we review any laboratory studies, imaging, and other diagnostic tests that are ordered as we care for you. Our office policy is 5 business days for review of these results, and any emergent or urgent results are addressed in a timely manner for your best interest. If you do not hear from our office in 1 week, please contact us.   We also encourage the use of MyChart, which contains your medical information for your review as well. If you are not enrolled in this feature, an access code is on this after visit summary for your convenience. Thank you for allowing [place] to be involved in your care.  It was great to see you today!  I hope you have a great Summer!!

## 2019-10-13 NOTE — Telephone Encounter (Signed)
Pt returned call.  [phone]

## 2019-10-14 ENCOUNTER — Telehealth: Payer: Self-pay | Admitting: Internal Medicine

## 2019-10-14 ENCOUNTER — Encounter: Payer: Self-pay | Admitting: Nurse Practitioner

## 2019-10-14 ENCOUNTER — Telehealth: Payer: Self-pay

## 2019-10-14 NOTE — Telephone Encounter (Signed)
DISREGARD CALL. Pt called back and said never mind

## 2019-10-14 NOTE — Telephone Encounter (Signed)
Pt called office and LMOVM, Suprep was $90 after coupon. She requested cheaper prep. Miralax instructions mailed. Tried to call pt, no answer, LMOVM to inform her.

## 2019-10-14 NOTE — Telephone Encounter (Signed)
Pt said she was returning a missed call from [place]. [phone]

## 2019-10-22 ENCOUNTER — Telehealth: Payer: Self-pay | Admitting: *Deleted

## 2019-10-22 NOTE — Telephone Encounter (Signed)
Vanessa Neal, you are scheduled for a virtual visit with your provider today.  Just as we do with appointments in the office, we must obtain your consent to participate.  Your consent will be active for this visit and any virtual visit you may have with one of our providers in the next 365 days.  If you have a MyChart account, I can also send a copy of this consent to you electronically.  All virtual visits are billed to your insurance company just like a traditional visit in the office.  As this is a virtual visit, video technology does not allow for your provider to perform a traditional examination.  This may limit your provider's ability to fully assess your condition.  If your provider identifies any concerns that need to be evaluated in person or the need to arrange testing such as labs, EKG, etc, we will make arrangements to do so.  Although advances in technology are sophisticated, we cannot ensure that it will always work on either your end or our end.  If the connection with a video visit is poor, we may have to switch to a telephone visit.  With either a video or telephone visit, we are not always able to ensure that we have a secure connection.   I need to obtain your verbal consent now.   Are you willing to proceed with your visit today?

## 2019-10-22 NOTE — Telephone Encounter (Signed)
Pt consented to a virtual visit on 10/13/19.

## 2019-11-06 ENCOUNTER — Other Ambulatory Visit: Payer: Self-pay

## 2019-11-06 ENCOUNTER — Other Ambulatory Visit (HOSPITAL_COMMUNITY)
Admission: RE | Admit: 2019-11-06 | Discharge: 2019-11-06 | Disposition: A | Discharge status: Home / Self Care | Payer: BC Managed Care – PPO | Source: Ambulatory Visit | Attending: Internal Medicine | Admitting: Internal Medicine

## 2019-11-06 DIAGNOSIS — Z20822 Contact with and (suspected) exposure to covid-19: Secondary | ICD-10-CM | POA: Insufficient documentation

## 2019-11-06 DIAGNOSIS — Z01812 Encounter for preprocedural laboratory examination: Secondary | ICD-10-CM | POA: Insufficient documentation

## 2019-11-06 LAB — SARS CORONAVIRUS 2 (TAT 6-24 HRS): SARS Coronavirus 2: NEGATIVE

## 2019-11-07 ENCOUNTER — Encounter (HOSPITAL_COMMUNITY): Payer: Self-pay | Admitting: Internal Medicine

## 2019-11-07 ENCOUNTER — Encounter (HOSPITAL_COMMUNITY)
Admission: RE | Disposition: A | Discharge status: Home / Self Care | Payer: Self-pay | Source: Home / Self Care | Attending: Internal Medicine

## 2019-11-07 ENCOUNTER — Encounter: Payer: Self-pay | Admitting: Internal Medicine

## 2019-11-07 ENCOUNTER — Other Ambulatory Visit: Payer: Self-pay

## 2019-11-07 ENCOUNTER — Ambulatory Visit (HOSPITAL_COMMUNITY)
Admission: RE | Admit: 2019-11-07 | Discharge: 2019-11-07 | Disposition: A | Discharge status: Home / Self Care | Payer: BC Managed Care – PPO | Attending: Internal Medicine | Admitting: Internal Medicine

## 2019-11-07 DIAGNOSIS — K52832 Lymphocytic colitis: Secondary | ICD-10-CM | POA: Insufficient documentation

## 2019-11-07 DIAGNOSIS — K642 Third degree hemorrhoids: Secondary | ICD-10-CM | POA: Insufficient documentation

## 2019-11-07 DIAGNOSIS — Z7982 Long term (current) use of aspirin: Secondary | ICD-10-CM | POA: Diagnosis not present

## 2019-11-07 DIAGNOSIS — K529 Noninfective gastroenteritis and colitis, unspecified: Secondary | ICD-10-CM

## 2019-11-07 DIAGNOSIS — Z79899 Other long term (current) drug therapy: Secondary | ICD-10-CM | POA: Insufficient documentation

## 2019-11-07 DIAGNOSIS — Z7984 Long term (current) use of oral hypoglycemic drugs: Secondary | ICD-10-CM | POA: Insufficient documentation

## 2019-11-07 DIAGNOSIS — K640 First degree hemorrhoids: Secondary | ICD-10-CM | POA: Insufficient documentation

## 2019-11-07 DIAGNOSIS — K921 Melena: Secondary | ICD-10-CM | POA: Diagnosis not present

## 2019-11-07 DIAGNOSIS — R197 Diarrhea, unspecified: Secondary | ICD-10-CM | POA: Diagnosis not present

## 2019-11-07 HISTORY — PX: COLONOSCOPY: SHX5424

## 2019-11-07 HISTORY — PX: BIOPSY: SHX5522

## 2019-11-07 SURGERY — COLONOSCOPY
Anesthesia: Moderate Sedation

## 2019-11-07 MED ORDER — MEPERIDINE HCL 50 MG/ML IJ SOLN
INTRAMUSCULAR | Status: AC
  Filled 2019-11-07: qty 1

## 2019-11-07 MED ORDER — MIDAZOLAM HCL 5 MG/5ML IJ SOLN
INTRAMUSCULAR | Status: DC | PRN
  Administered 2019-11-07 (×2): 2 mg via INTRAVENOUS
  Administered 2019-11-07 (×3): 1 mg via INTRAVENOUS

## 2019-11-07 MED ORDER — SODIUM CHLORIDE 0.9 % IV SOLN: INTRAVENOUS | Status: DC

## 2019-11-07 MED ORDER — ONDANSETRON HCL 4 MG/2ML IJ SOLN
INTRAMUSCULAR | Status: AC
  Filled 2019-11-07: qty 2

## 2019-11-07 MED ORDER — ONDANSETRON HCL 4 MG/2ML IJ SOLN
INTRAMUSCULAR | Status: DC | PRN
  Administered 2019-11-07: 4 mg via INTRAVENOUS

## 2019-11-07 MED ORDER — MEPERIDINE HCL 100 MG/ML IJ SOLN
INTRAMUSCULAR | Status: DC | PRN
  Administered 2019-11-07: 15 mg
  Administered 2019-11-07: 10 mg
  Administered 2019-11-07: 25 mg

## 2019-11-07 MED ORDER — MIDAZOLAM HCL 5 MG/5ML IJ SOLN
INTRAMUSCULAR | Status: AC
  Filled 2019-11-07: qty 10

## 2019-11-07 NOTE — Interval H&P Note (Signed)
History and Physical Interval Note:  11/07/2019 12:50 PM  Vanessa Neal  has presented today for surgery, with the diagnosis of diarrhea, hematochezia.  The various methods of treatment have been discussed with the patient and family. After consideration of risks, benefits and other options for treatment, the patient has consented to  Procedure(s) with comments: COLONOSCOPY (N/A) - 1:15pm as a surgical intervention.  The patient's history has been reviewed, patient examined, no change in status, stable for surgery.  I have reviewed the patient's chart and labs.  Questions were answered to the patient's satisfaction.     Rilie Glanz  No change.  Denies rectal bleeding.  Diagnostic colonoscopy per plan.  The risks, benefits, limitations, alternatives and imponderables have been reviewed with the patient. Questions have been answered. All parties are agreeable.

## 2019-11-07 NOTE — Op Note (Signed)
[facility] Patient Name: Vanessa Neal Procedure Date: 11/07/2019 12:40 PM MRN: [id number] Date of Birth: [birth year] Attending MD: Norvel Richards , MD CSN: [id number] Age: 59 Admit Type: Outpatient Procedure:                Colonoscopy Indications:              Chronic diarrhea, Hematochezia Providers:                Norvel Richards, MD, Janeece Riggers, RN, Crystal                            Page Referring MD:              Medicines:                Midazolam 7 mg IV, Meperidine 50 mg IV, Ondansetron                            4 mg IV Complications:            No immediate complications. Estimated Blood Loss:     Estimated blood loss: none. Procedure:                Pre-Anesthesia Assessment:                           - Prior to the procedure, a History and Physical                            was performed, and patient medications and                            allergies were reviewed. The patient's tolerance of                            previous anesthesia was also reviewed. The risks                            and benefits of the procedure and the sedation                            options and risks were discussed with the patient.                            All questions were answered, and informed consent                            was obtained. Prior Anticoagulants: The patient has                            taken no previous anticoagulant or antiplatelet                            agents. ASA Grade Assessment: II - A patient with  mild systemic disease. After reviewing the risks                            and benefits, the patient was deemed in                            satisfactory condition to undergo the procedure.                           After obtaining informed consent, the colonoscope                            was passed under direct vision. Throughout the                            procedure, the patient's blood pressure, pulse,  and                            oxygen saturations were monitored continuously. The                            [id number]-[id number] ([id number]) scope was introduced through                            the anus and advanced to the the terminal ileum                            identified by appendiceal orifice and ileocecal                            valve. Scope In: 1:06:41 PM Scope Out: 1:19:25 PM Scope Withdrawal Time: 0 hours 9 minutes 35 seconds  Total Procedure Duration: 0 hours 12 minutes 44 seconds  Findings:      The perianal and digital rectal examinations were normal.      The colon (entire examined portion) appeared normal. Distal 5 cm of TI       appeared normal      Ascending and descending/sigmoid segmental biopsies taken      Non-bleeding internal hemorrhoids were found during endoscopy. The       hemorrhoids were mild, small and Grade I (internal hemorrhoids that do       not prolapse). Impression:               - The entire examined colon is normal. Normal                            terminal ileum.                           - Non-bleeding internal hemorrhoids.                           Status post segmental biopsy. I suspect the patient                            has bled from  grade 3 hemorrhoids. Patient may be a                            reasonably good hemorrhoid banding candidate. Moderate Sedation:      Moderate (conscious) sedation was administered by the endoscopy nurse       and supervised by the endoscopist. The following parameters were       monitored: oxygen saturation, heart rate, blood pressure, respiratory       rate, EKG, adequacy of pulmonary ventilation, and response to care.       Total physician intraservice time was 20 minutes. Recommendation:           - Patient has a contact number available for                            emergencies. The signs and symptoms of potential                            delayed complications were discussed with the                             patient. Return to normal activities tomorrow.                            Written discharge instructions were provided to the                            patient.                           - Advance diet as tolerated.                           - Continue present medications.                           - Repeat colonoscopy in 10 years for screening                            purposes.                           - Return to GI clinic in 6 weeks (AB). Pamphlet on                            hemorrhoid banding provided. Further                            recommendations to follow pending review of                            pathology report. Procedure Code(s):        --- Professional ---                           [phone], Colonoscopy, flexible; diagnostic, including  collection of specimen(s) by brushing or washing,                            when performed (separate procedure)                           G0500, Moderate sedation services provided by the                            same physician or other qualified health care                            professional performing a gastrointestinal                            endoscopic service that sedation supports,                            requiring the presence of an independent trained                            observer to assist in the monitoring of the                            patient's level of consciousness and physiological                            status; initial 15 minutes of intra-service time;                            patient age 56 years or older (additional time may                            be reported with [phone], as appropriate) Diagnosis Code(s):        --- Professional ---                           K64.0, First degree hemorrhoids                           K52.9, Noninfective gastroenteritis and colitis,                            unspecified                           K92.1, Melena (includes  Hematochezia) CPT copyright 2019 American Medical Association. All rights reserved. The codes documented in this report are preliminary and upon coder review may  be revised to meet current compliance requirements. Cristopher Estimable. Killian Ress, MD Norvel Richards, MD 11/07/2019 1:29:33 PM This report has been signed electronically. Number of Addenda: 0

## 2019-11-07 NOTE — Discharge Instructions (Signed)
Colonoscopy Discharge Instructions  Read the instructions outlined below and refer to this sheet in the next few weeks. These discharge instructions provide you with general information on caring for yourself after you leave the hospital. Your doctor may also give you specific instructions. While your treatment has been planned according to the most current medical practices available, unavoidable complications occasionally occur. If you have any problems or questions after discharge, call Dr. Gala Romney at [phone]. ACTIVITY  You may resume your regular activity, but move at a slower pace for the next 24 hours.   Take frequent rest periods for the next 24 hours.   Walking will help get rid of the air and reduce the bloated feeling in your belly (abdomen).   No driving for 24 hours (because of the medicine (anesthesia) used during the test).    Do not sign any important legal documents or operate any machinery for 24 hours (because of the anesthesia used during the test).  NUTRITION  Drink plenty of fluids.   You may resume your normal diet as instructed by your doctor.   Begin with a light meal and progress to your normal diet. Heavy or fried foods are harder to digest and may make you feel sick to your stomach (nauseated).   Avoid alcoholic beverages for 24 hours or as instructed.  MEDICATIONS  You may resume your normal medications unless your doctor tells you otherwise.  WHAT YOU CAN EXPECT TODAY  Some feelings of bloating in the abdomen.   Passage of more gas than usual.   Spotting of blood in your stool or on the toilet paper.  IF YOU HAD POLYPS REMOVED DURING THE COLONOSCOPY:  No aspirin products for 7 days or as instructed.   No alcohol for 7 days or as instructed.   Eat a soft diet for the next 24 hours.  FINDING OUT THE RESULTS OF YOUR TEST Not all test results are available during your visit. If your test results are not back during the visit, make an appointment  with your caregiver to find out the results. Do not assume everything is normal if you have not heard from your caregiver or the medical facility. It is important for you to follow up on all of your test results.  SEEK IMMEDIATE MEDICAL ATTENTION IF:  You have more than a spotting of blood in your stool.   Your belly is swollen (abdominal distention).   You are nauseated or vomiting.   You have a temperature over 101.   You have abdominal pain or discomfort that is severe or gets worse throughout the day.     Hemorrhoid information provided  Hemorrhoid banding pamphlet provided  Colon lining was sampled today.  Further recommendations to follow pending review of pathology report  Office visit with [place] in 6 weeks (AB).  At patient request, I called Nevena Rozenberg at [phone] -left a message on voicemail   Hemorrhoids Hemorrhoids are swollen veins in and around the rectum or anus. There are two types of hemorrhoids:  Internal hemorrhoids. These occur in the veins that are just inside the rectum. They may poke through to the outside and become irritated and painful.  External hemorrhoids. These occur in the veins that are outside the anus and can be felt as a painful swelling or hard lump near the anus. Most hemorrhoids do not cause serious problems, and they can be managed with home treatments such as diet and lifestyle changes. If home treatments do not help the  symptoms, procedures can be done to shrink or remove the hemorrhoids. What are the causes? This condition is caused by increased pressure in the anal area. This pressure may result from various things, including:  Constipation.  Straining to have a bowel movement.  Diarrhea.  Pregnancy.  Obesity.  Sitting for long periods of time.  Heavy lifting or other activity that causes you to strain.  Anal sex.  Riding a bike for a long period of time. What are the signs or symptoms? Symptoms of this condition  include:  Pain.  Anal itching or irritation.  Rectal bleeding.  Leakage of stool (feces).  Anal swelling.  One or more lumps around the anus. How is this diagnosed? This condition can often be diagnosed through a visual exam. Other exams or tests may also be done, such as:  An exam that involves feeling the rectal area with a gloved hand (digital rectal exam).  An exam of the anal canal that is done using a small tube (anoscope).  A blood test, if you have lost a significant amount of blood.  A test to look inside the colon using a flexible tube with a camera on the end (sigmoidoscopy or colonoscopy). How is this treated? This condition can usually be treated at home. However, various procedures may be done if dietary changes, lifestyle changes, and other home treatments do not help your symptoms. These procedures can help make the hemorrhoids smaller or remove them completely. Some of these procedures involve surgery, and others do not. Common procedures include:  Rubber band ligation. Rubber bands are placed at the base of the hemorrhoids to cut off their blood supply.  Sclerotherapy. Medicine is injected into the hemorrhoids to shrink them.  Infrared coagulation. A type of light energy is used to get rid of the hemorrhoids.  Hemorrhoidectomy surgery. The hemorrhoids are surgically removed, and the veins that supply them are tied off.  Stapled hemorrhoidopexy surgery. The surgeon staples the base of the hemorrhoid to the rectal wall. Follow these instructions at home: Eating and drinking   Eat foods that have a lot of fiber in them, such as whole grains, beans, nuts, fruits, and vegetables.  Ask your health care provider about taking products that have added fiber (fiber supplements).  Reduce the amount of fat in your diet. You can do this by eating low-fat dairy products, eating less red meat, and avoiding processed foods.  Drink enough fluid to keep your urine pale  yellow. Managing pain and swelling   Take warm sitz baths for 20 minutes, 3-4 times a day to ease pain and discomfort. You may do this in a bathtub or using a portable sitz bath that fits over the toilet.  If directed, apply ice to the affected area. Using ice packs between sitz baths may be helpful. ? Put ice in a plastic bag. ? Place a towel between your skin and the bag. ? Leave the ice on for 20 minutes, 2-3 times a day. General instructions  Take over-the-counter and prescription medicines only as told by your health care provider.  Use medicated creams or suppositories as told.  Get regular exercise. Ask your health care provider how much and what kind of exercise is best for you. In general, you should do moderate exercise for at least 30 minutes on most days of the week (150 minutes each week). This can include activities such as walking, biking, or yoga.  Go to the bathroom when you have the urge to have  a bowel movement. Do not wait.  Avoid straining to have bowel movements.  Keep the anal area dry and clean. Use wet toilet paper or moist towelettes after a bowel movement.  Do not sit on the toilet for long periods of time. This increases blood pooling and pain.  Keep all follow-up visits as told by your health care provider. This is important. Contact a health care provider if you have:  Increasing pain and swelling that are not controlled by treatment or medicine.  Difficulty having a bowel movement, or you are unable to have a bowel movement.  Pain or inflammation outside the area of the hemorrhoids. Get help right away if you have:  Uncontrolled bleeding from your rectum. Summary  Hemorrhoids are swollen veins in and around the rectum or anus.  Most hemorrhoids can be managed with home treatments such as diet and lifestyle changes.  Taking warm sitz baths can help ease pain and discomfort.  In severe cases, procedures or surgery can be done to shrink or  remove the hemorrhoids. This information is not intended to replace advice given to you by your health care provider. Make sure you discuss any questions you have with your health care provider. Document Revised: 10/11/2018 Document Reviewed: 10/04/2017 Elsevier Patient Education  [place].

## 2019-11-10 ENCOUNTER — Encounter: Payer: Self-pay | Admitting: Internal Medicine

## 2019-11-10 LAB — SURGICAL PATHOLOGY

## 2019-11-11 ENCOUNTER — Encounter (HOSPITAL_COMMUNITY): Payer: Self-pay | Admitting: Internal Medicine

## 2019-11-11 ENCOUNTER — Other Ambulatory Visit: Payer: Self-pay

## 2019-11-11 ENCOUNTER — Telehealth: Payer: Self-pay

## 2019-11-11 MED ORDER — BUDESONIDE 3 MG PO CPEP: 9.0000 mg | ORAL_CAPSULE | Freq: Every day | ORAL | 1 refills | Status: DC

## 2019-11-11 NOTE — Telephone Encounter (Signed)
Lmom, waiting on a return call. Entocort #90, take 9 mg daily with 1 rf was sent to pts pharmacy. Letter was mailed to pt.

## 2019-11-11 NOTE — Telephone Encounter (Signed)
Per RMR- Send letter to patient.  Send copy of letter with path to referring provider and PCP.   Needs RX for entocort 9 mg daily; Disp (90) 3 mg tablets with 1 refill;  ov 4- 6 weeks

## 2019-11-12 NOTE — Telephone Encounter (Signed)
OV made °

## 2019-11-17 ENCOUNTER — Telehealth: Payer: Self-pay | Admitting: Internal Medicine

## 2019-11-17 NOTE — Telephone Encounter (Signed)
Pt was calling to speak with AM. [phone]

## 2019-11-17 NOTE — Telephone Encounter (Signed)
Spoke with pt. She was given results and knows she will start Budesonide 9 mg daily.   Erline Levine- pt is scheduled for a banding apt in 12/2019. Pt would like to see if she can get a 3 pm or later due to working earlier in the day. Pt is aware that this may push her banding apt out and is ok with that.

## 2019-11-18 NOTE — Telephone Encounter (Signed)
LMOM for patient to call me back regarding her OV with AB. She is scheduled for 8/11 at 0900 or she can come 8/17 at 330 pm.

## 2019-12-15 ENCOUNTER — Other Ambulatory Visit: Payer: Self-pay | Admitting: Internal Medicine

## 2019-12-15 NOTE — Telephone Encounter (Signed)
Pt returned call. Pt is aware that a 1 month supply of Budesonide 9 mg daily was sent to her pharmacy. Pt will take med as directed and f/u with AB on 8/17 to discuss tapering.

## 2019-12-15 NOTE — Telephone Encounter (Signed)
Lmom, waiting on a return call.  

## 2019-12-15 NOTE — Telephone Encounter (Signed)
Vanessa Neal, please let patient know that I am refilling a 1 month supply of Budesonide 9 mg daily. Ideally, she should take 9 mg daily for 6-8 weeks then taper slowly thereafter.  Medication sent to pharmacy on 6/15.  She should complete 8 weeks of therapy around 8/15.  She has appointment with Roseanne Kaufman, NP on 8/17.  Discussion of tapering budesonide needs to be discussed at that office visit.

## 2020-01-05 DIAGNOSIS — R1030 Lower abdominal pain, unspecified: Secondary | ICD-10-CM | POA: Diagnosis not present

## 2020-01-05 DIAGNOSIS — E782 Mixed hyperlipidemia: Secondary | ICD-10-CM | POA: Diagnosis not present

## 2020-01-05 DIAGNOSIS — I10 Essential (primary) hypertension: Secondary | ICD-10-CM | POA: Diagnosis not present

## 2020-01-05 DIAGNOSIS — Z6833 Body mass index (BMI) 33.0-33.9, adult: Secondary | ICD-10-CM | POA: Diagnosis not present

## 2020-01-05 DIAGNOSIS — R0981 Nasal congestion: Secondary | ICD-10-CM | POA: Diagnosis not present

## 2020-01-05 DIAGNOSIS — R7301 Impaired fasting glucose: Secondary | ICD-10-CM | POA: Diagnosis not present

## 2020-01-05 DIAGNOSIS — E6609 Other obesity due to excess calories: Secondary | ICD-10-CM | POA: Diagnosis not present

## 2020-01-05 DIAGNOSIS — Z853 Personal history of malignant neoplasm of breast: Secondary | ICD-10-CM | POA: Diagnosis not present

## 2020-01-07 ENCOUNTER — Ambulatory Visit: Payer: BC Managed Care – PPO | Admitting: Gastroenterology

## 2020-01-07 ENCOUNTER — Encounter: Payer: Self-pay | Admitting: Genetic Counselor

## 2020-01-08 DIAGNOSIS — R0981 Nasal congestion: Secondary | ICD-10-CM | POA: Diagnosis not present

## 2020-01-08 DIAGNOSIS — Z853 Personal history of malignant neoplasm of breast: Secondary | ICD-10-CM | POA: Diagnosis not present

## 2020-01-08 DIAGNOSIS — I10 Essential (primary) hypertension: Secondary | ICD-10-CM | POA: Diagnosis not present

## 2020-01-08 DIAGNOSIS — R944 Abnormal results of kidney function studies: Secondary | ICD-10-CM | POA: Diagnosis not present

## 2020-01-13 ENCOUNTER — Ambulatory Visit: Payer: Self-pay | Admitting: Nurse Practitioner

## 2020-01-13 ENCOUNTER — Other Ambulatory Visit: Payer: Self-pay

## 2020-01-13 ENCOUNTER — Encounter: Payer: Self-pay | Admitting: Gastroenterology

## 2020-01-13 ENCOUNTER — Ambulatory Visit (INDEPENDENT_AMBULATORY_CARE_PROVIDER_SITE_OTHER): Payer: BC Managed Care – PPO | Admitting: Gastroenterology

## 2020-01-13 VITALS — BP 138/80 | HR 65 | Temp 97.3°F | Ht 63.0 in | Wt 174.0 lb

## 2020-01-13 DIAGNOSIS — K52832 Lymphocytic colitis: Secondary | ICD-10-CM

## 2020-01-13 MED ORDER — BUDESONIDE 3 MG PO CPEP
ORAL_CAPSULE | ORAL | 0 refills | Status: DC
Start: 2020-01-13 — End: 2020-02-18

## 2020-01-13 NOTE — Patient Instructions (Signed)
After you finish the bottle you have, we are starting a taper.  You will take 2 capsules of budesonide each morning for 2 weeks, then 1 capsule each morning for 2 weeks, then stop.  I will see you in mid to late September to do the banding!  Let me know if the copay is high for the budesonide, as we could call into [facility] to get it compounded.  It was a pleasure to see you today. I want to create trusting relationships with patients to provide genuine, compassionate, and quality care. I value your feedback. If you receive a survey regarding your visit,  I greatly appreciate you taking time to fill this out.   Annitta Needs, PhD, ANP-BC [facility]

## 2020-01-13 NOTE — Progress Notes (Signed)
Referring Provider: Celene Squibb, MD Primary Care Physician:  Celene Squibb, MD  Primary GI: Dr. Gala Romney   Chief Complaint  Patient presents with  . Follow-up    fu from [facility], hemorrhoid banding also    HPI:   Zhara Gieske is a 59 y.o. female presenting today with a history of rectal bleeding and loose stool, s/p colonoscopy June 2021 with non-bleeding internal hemorrhoids, s/p biopsy with findings of lymphocytic colitis. Prescribed budesonide. Presenting now to discuss banding candidacy.   She notes stool consistency is much improved. No rectal bleeding. Has had to use a sitz bath at times. Symptomatically with Grade 2 hemorrhoids, prolapsing but resolve on own. BMs down to one per day. She is nearly finished with 8 weeks of therapy.   Past Medical History:  Diagnosis Date  . BRCA negative 02/14/2013  . Breast cancer ([place])   . Breast disorder    cancer  . Fecal occult blood test positive 04/29/2014  . Infiltrating ductal carcinoma of right female breast ([place]) 01/04/2011  . Ovarian cyst   . Personal history of chemotherapy   . Personal history of radiation therapy   . Right thyroid nodule     Past Surgical History:  Procedure Laterality Date  . ABDOMINAL HYSTERECTOMY N/A 06/16/2014   Procedure: HYSTERECTOMY ABDOMINAL;  Surgeon: Jonnie Kind, MD;  Location: [facility];  Service: Gynecology;  Laterality: N/A;  . BIOPSY  11/07/2019   Procedure: BIOPSY;  Surgeon: Daneil Dolin, MD;  [facility];  Service: Endoscopy;;  . BREAST CYST ASPIRATION  01/2012  . BREAST CYST EXCISION    . BREAST LUMPECTOMY     right  . CESAREAN SECTION     X 2  . CHOLECYSTECTOMY    . COLONOSCOPY  03/11/2012   Procedure: COLONOSCOPY;  Surgeon: Danie Binder, MD;  [facility];  Service: Endoscopy;  Laterality: N/A;  9:30 AM-changed to 9:50 Doris notified  . COLONOSCOPY N/A 11/07/2019   Procedure: COLONOSCOPY;  Surgeon: Daneil Dolin, MD;  [facility];  Service:  Endoscopy;  Laterality: N/A;  1:15pm  . LIPOMA EXCISION     from back  . SALPINGOOPHORECTOMY Bilateral 06/16/2014   Procedure: BILATERAL SALPINGO OOPHORECTOMY;  Surgeon: Jonnie Kind, MD;  Location: [facility];  Service: Gynecology;  Laterality: Bilateral;  . SCAR REVISION N/A 06/16/2014   Procedure: SCAR REVISION;  Surgeon: Jonnie Kind, MD;  Location: [facility];  Service: Gynecology;  Laterality: N/A;    Current Outpatient Medications  Medication Sig Dispense Refill  . budesonide (ENTOCORT EC) 3 MG 24 hr capsule TAKE 3 CAPSULES BY MOUTH ONCE DAILY 90 capsule 0  . Calcium Carb-Cholecalciferol (CALCIUM 600 + D PO) Take 1 tablet by mouth daily.    . cetirizine (ZYRTEC) 10 MG tablet Take 10 mg by mouth daily. Takes Mon, Wed, and Fri    . Cholecalciferol (VITAMIN D3) 125 MCG (5000 UT) TABS Take 5,000 Units by mouth daily.     . diphenhydramine-acetaminophen (TYLENOL PM) 25-500 MG TABS tablet Take 1 tablet by mouth as needed (sleep).     Marland Kitchen FARXIGA 5 MG TABS tablet Take 5 mg by mouth daily.    . hydrochlorothiazide (HYDRODIURIL) 12.5 MG tablet Take 12.5 mg by mouth daily.    . Multiple Vitamins-Calcium (ONE-A-DAY WOMENS PO) Take 1 tablet by mouth daily.     . pravastatin (PRAVACHOL) 10 MG tablet Take 10 mg by mouth daily.    Marland Kitchen  Probiotic Product (ALIGN PO) Take 1 capsule by mouth daily.     Marland Kitchen venlafaxine (EFFEXOR) 37.5 MG tablet TAKE (1) TABLET BY MOUTH THREE TIMES DAILY WITH FOOD. (Patient taking differently: Take 37.5 mg by mouth daily. ) 90 tablet 5  . budesonide (ENTOCORT EC) 3 MG 24 hr capsule Take 2 capsules daily for 2 weeks then 1 capsule daily for 2 weeks then stop. 42 capsule 0   No current facility-administered medications for this visit.    Allergies as of 01/13/2020 - Review Complete 01/13/2020  Allergen Reaction Noted  . Banana Nausea Only 10/30/2019  . Kiwi extract Nausea Only 01/13/2020    Family History  Problem Relation Age of Onset  . Cancer Mother        breast  .  Kidney failure Father   . Cancer Maternal Grandmother        colon  . Colon cancer Maternal Grandmother   . Cancer Paternal Grandmother        ovarian  . Cancer Sister        melanoma    Social History   Socioeconomic History  . Marital status: Married    Spouse name: Not on file  . Number of children: Not on file  . Years of education: Not on file  . Highest education level: Not on file  Occupational History  . Not on file  Tobacco Use  . Smoking status: Never Smoker  . Smokeless tobacco: Never Used  Substance and Sexual Activity  . Alcohol use: No  . Drug use: No  . Sexual activity: Yes    Birth control/protection: Other-see comments, Surgical    Comment: Pt had chemo and no longer has a period.  Other Topics Concern  . Not on file  Social History Narrative  . Not on file   Social Determinants of Health   Financial Resource Strain:   . Difficulty of Paying Living Expenses: Not on file  Food Insecurity:   . Worried About Charity fundraiser in the Last Year: Not on file  . Ran Out of Food in the Last Year: Not on file  Transportation Needs:   . Lack of Transportation (Medical): Not on file  . Lack of Transportation (Non-Medical): Not on file  Physical Activity:   . Days of Exercise per Week: Not on file  . Minutes of Exercise per Session: Not on file  Stress:   . Feeling of Stress : Not on file  Social Connections:   . Frequency of Communication with Friends and Family: Not on file  . Frequency of Social Gatherings with Friends and Family: Not on file  . Attends Religious Services: Not on file  . Active Member of Clubs or Organizations: Not on file  . Attends Archivist Meetings: Not on file  . Marital Status: Not on file    Review of Systems: Gen: Denies fever, chills, anorexia. Denies fatigue, weakness, weight loss.  CV: Denies chest pain, palpitations, syncope, peripheral edema, and claudication. Resp: Denies dyspnea at rest, cough, wheezing,  coughing up blood, and pleurisy. GI: Denies vomiting blood, jaundice, and fecal incontinence.   Denies dysphagia or odynophagia. Derm: Denies rash, itching, dry skin Psych: Denies depression, anxiety, memory loss, confusion. No homicidal or suicidal ideation.  Heme: Denies bruising, bleeding, and enlarged lymph nodes.  Physical Exam: BP 138/80   Pulse 65   Temp (!) 97.3 F (36.3 C) (Oral)   Ht _0  (1.6 m)   Wt 174 lb (  78.9 kg)   BMI 30.82 kg/m  General:   Alert and oriented. No distress noted. Pleasant and cooperative.  Head:  Normocephalic and atraumatic. Eyes:  Conjuctiva clear without scleral icterus. Mouth:  Mask in place Abdomen:  +BS, soft, non-tender and non-distended. No rebound or guarding. No HSM or masses noted. Msk:  Symmetrical without gross deformities. Normal posture. Extremities:  Without edema. Neurologic:  Alert and  oriented x4 Psych:  Alert and cooperative. Normal mood and affect.  ASSESSMENT: Myangel Summons is a 59 y.o. female presenting today with history of diarrhea and rectal bleeding for routine follow-up.  Found to have lymphocytic colitis on pathology from recent colonoscopy, noting significant improvement and near remission on budesonide for 8 weeks. We will start tapering her down over the next 4 weeks. Discussed that I would rather her be finished with this course of therapy prior to any banding. Symptomatic grade 2 hemorrhoids by description.   I have called in compounded budesonide to [facility], as this is less expensive than going through her insurance. Taper instructions: take 6 mg for 2 weeks, 3 mg for 2 weeks, then stop.    PLAN:  Budesonide taper as described  Return for banding in mid to late September  Call if recurrent diarrhea while on taper  Annitta Needs, PhD, ANP-BC [facility]

## 2020-01-14 ENCOUNTER — Telehealth: Payer: Self-pay | Admitting: Internal Medicine

## 2020-01-14 NOTE — Telephone Encounter (Signed)
[phone]  Please call patient about her medication, it was expensive to fill and Vicente Males had said it could be compounded at [facility] for less.

## 2020-01-14 NOTE — Telephone Encounter (Signed)
Spoke with pt. Pt is going to take her last pills of Budesonide on this Friday 01/16/20. Pt is currently pay a little over $80.00. pt would like to know if Roseanne Kaufman, NP could send the compounded med to [place] since it will cost pt less than $80.00.

## 2020-01-14 NOTE — Telephone Encounter (Signed)
Budesonide 6 mg for 2 weeks then 3 mg for 2 weeks called into [place]. They will be calling her. Likely 65$ total.

## 2020-01-14 NOTE — Telephone Encounter (Signed)
Spoke with pt. Pt is aware of the cost of Budesonide and will wait for the pharmacy to call her when medication is ready.

## 2020-01-19 ENCOUNTER — Encounter: Payer: Self-pay | Admitting: Gastroenterology

## 2020-01-21 ENCOUNTER — Other Ambulatory Visit (HOSPITAL_COMMUNITY): Payer: Self-pay | Admitting: Hematology

## 2020-01-21 DIAGNOSIS — Z780 Asymptomatic menopausal state: Secondary | ICD-10-CM

## 2020-02-18 ENCOUNTER — Ambulatory Visit (INDEPENDENT_AMBULATORY_CARE_PROVIDER_SITE_OTHER): Payer: BC Managed Care – PPO | Admitting: Gastroenterology

## 2020-02-18 ENCOUNTER — Encounter: Payer: Self-pay | Admitting: Gastroenterology

## 2020-02-18 ENCOUNTER — Other Ambulatory Visit: Payer: Self-pay

## 2020-02-18 VITALS — BP 118/78 | HR 80 | Temp 97.3°F | Ht 64.0 in | Wt 175.8 lb

## 2020-02-18 DIAGNOSIS — K641 Second degree hemorrhoids: Secondary | ICD-10-CM | POA: Diagnosis not present

## 2020-02-18 MED ORDER — BUDESONIDE 3 MG PO CPEP: 3.0000 mg | ORAL_CAPSULE | Freq: Every day | ORAL | 0 refills | Status: DC

## 2020-02-18 NOTE — Patient Instructions (Signed)
Avoid straining. Limit toilet time to 2-3 minutes.  I will see you back in about 4-6 weeks.  I have printed another prescription to have on hand, but don't fill this before talking to [place] if you start having recurrent diarrhea. We will need to tell you if it should be started or not.   I enjoyed seeing you again today! As you know, I value our relationship and want to provide genuine, compassionate, and quality care. I welcome your feedback. If you receive a survey regarding your visit,  I greatly appreciate you taking time to fill this out. See you next time!  Annitta Needs, PhD, ANP-BC [facility]

## 2020-02-18 NOTE — Progress Notes (Signed)
[facility] Banding Note:    59 year old female with history of rectal bleeding and loose stool, s/p colonoscopy June 2021 with non-bleeding internal hemorrhoids, s/p biopsy with findings of lymphocytic colitis. Prescribed budesonide. She has tapered off of budesonide. Completed course of therapy. Here to discuss banding. She notes some prolapsing sensation and soiling. No other symptoms.   The patient presents with symptomatic grade 2 hemorrhoids, unresponsive to maximal medical therapy, requesting rubber band ligation of her hemorrhoidal disease. All risks, benefits, and alternative forms of therapy were described and informed consent was obtained.  The decision was made to band the left lateral internal hemorrhoid, and the [place] was used to perform band ligation without complication. Digital anorectal examination was then performed to assure proper positioning of the band, and to adjust the banded tissue as required. The patient was discharged home without pain or other issues. Dietary and behavioral recommendations were given and (if necessary prescriptions were given), along with follow-up instructions. The patient will return in 4-6 weeks  for followup and possible additional banding as required. She is requesting to have a budesonide prescription on hand in case of a flare in the future. She works at [employer] and worried about receiving in a timely manner if she were to need to start this in the future. I have printed to keep on file but she understands she is not to take this on her own. She will call us if any recurrent diarrhea, and we will assess from there.   No complications were encountered and the patient tolerated the procedure well.  Annitta Needs, PhD, ANP-BC [facility]

## 2020-04-06 ENCOUNTER — Other Ambulatory Visit: Payer: Self-pay

## 2020-04-06 ENCOUNTER — Ambulatory Visit (INDEPENDENT_AMBULATORY_CARE_PROVIDER_SITE_OTHER): Payer: BC Managed Care – PPO | Admitting: Gastroenterology

## 2020-04-06 ENCOUNTER — Encounter: Payer: Self-pay | Admitting: Gastroenterology

## 2020-04-06 VITALS — BP 122/75 | HR 77 | Temp 97.5°F | Ht 64.0 in | Wt 171.2 lb

## 2020-04-06 DIAGNOSIS — K52832 Lymphocytic colitis: Secondary | ICD-10-CM

## 2020-04-06 DIAGNOSIS — K649 Unspecified hemorrhoids: Secondary | ICD-10-CM | POA: Diagnosis not present

## 2020-04-06 NOTE — Patient Instructions (Signed)
I am glad you are doing well!  Please call if recurrent diarrhea, rectal bleeding, itching, prolapsed tissue, etc. We still have room for banding if needed, but as you are doing well, we will leave it alone!  I will see you in 6-8 months!  Have a great holiday season!  I enjoyed seeing you again today! As you know, I value our relationship and want to provide genuine, compassionate, and quality care. I welcome your feedback. If you receive a survey regarding your visit,  I greatly appreciate you taking time to fill this out. See you next time!  Annitta Needs, PhD, ANP-BC [facility]

## 2020-04-06 NOTE — Progress Notes (Signed)
Referring Provider: Celene Squibb, MD Primary Care Physician:  Celene Squibb, MD Primary GI: Dr. Gala Romney   Chief Complaint  Patient presents with  . Hemorrhoids    f/u. Hemorrhoids doing fine    HPI:   Vanessa Neal is a 59 y.o. female presenting today with a history of rectal bleeding and loose stool, s/p colonoscopy June 2021 with non-bleeding internal hemorrhoids, s/p biopsy with findings of lymphocytic colitis. Prescribed budesonide. She completed budesonide several months ago.  First banding was done of left lateral in Sept 2021.    Urgency with BM just once per day since on Farxiga. Bowel habits are much improved after budesonide. No abdominal pain. No rectal bleeding. No tissue prolapsing. Denying burning/itching. Wants to hold off on further bandings right now.   Past Medical History:  Diagnosis Date  . BRCA negative 02/14/2013  . Breast cancer ([place])   . Breast disorder    cancer  . Fecal occult blood test positive 04/29/2014  . Infiltrating ductal carcinoma of right female breast ([place]) 01/04/2011  . Ovarian cyst   . Personal history of chemotherapy   . Personal history of radiation therapy   . Right thyroid nodule     Past Surgical History:  Procedure Laterality Date  . ABDOMINAL HYSTERECTOMY N/A 06/16/2014   Procedure: HYSTERECTOMY ABDOMINAL;  Surgeon: Jonnie Kind, MD;  Location: [facility];  Service: Gynecology;  Laterality: N/A;  . BIOPSY  11/07/2019   Procedure: BIOPSY;  Surgeon: Daneil Dolin, MD;  [facility];  Service: Endoscopy;;  . BREAST CYST ASPIRATION  01/2012  . BREAST CYST EXCISION    . BREAST LUMPECTOMY     right  . CESAREAN SECTION     X 2  . CHOLECYSTECTOMY    . COLONOSCOPY  03/11/2012   Procedure: COLONOSCOPY;  Surgeon: Danie Binder, MD;  [facility];  Service: Endoscopy;  Laterality: N/A;  9:30 AM-changed to 9:50 Doris notified  . COLONOSCOPY N/A 11/07/2019   Normal colon and TI. Non-bleeding internal hemorrhoids.  S/p biopsy. Lymphocytic colitis. Prescribed budesonide.   Marland Kitchen LIPOMA EXCISION     from back  . SALPINGOOPHORECTOMY Bilateral 06/16/2014   Procedure: BILATERAL SALPINGO OOPHORECTOMY;  Surgeon: Jonnie Kind, MD;  Location: [facility];  Service: Gynecology;  Laterality: Bilateral;  . SCAR REVISION N/A 06/16/2014   Procedure: SCAR REVISION;  Surgeon: Jonnie Kind, MD;  Location: [facility];  Service: Gynecology;  Laterality: N/A;    Current Outpatient Medications  Medication Sig Dispense Refill  . Calcium Carb-Cholecalciferol (CALCIUM 600 + D PO) Take 1 tablet by mouth in the morning and at bedtime.     . cetirizine (ZYRTEC) 10 MG tablet Take 10 mg by mouth daily. Takes Mon, Wed, and Fri    . diphenhydramine-acetaminophen (TYLENOL PM) 25-500 MG TABS tablet Take 1 tablet by mouth as needed (sleep).     Marland Kitchen FARXIGA 5 MG TABS tablet Take 5 mg by mouth daily.    . hydrochlorothiazide (HYDRODIURIL) 12.5 MG tablet Take 12.5 mg by mouth daily.    . Multiple Vitamins-Calcium (ONE-A-DAY WOMENS PO) Take 1 tablet by mouth daily.     . pravastatin (PRAVACHOL) 10 MG tablet Take 10 mg by mouth daily.    . Probiotic Product (ALIGN PO) Take 1 capsule by mouth daily.     Marland Kitchen venlafaxine (EFFEXOR) 37.5 MG tablet TAKE (1) TABLET BY MOUTH THREE TIMES DAILY WITH FOOD. (Patient taking differently: Take 37.5  mg by mouth daily. ) 90 tablet 5   No current facility-administered medications for this visit.    Allergies as of 04/06/2020 - Review Complete 04/06/2020  Allergen Reaction Noted  . Banana Nausea Only 10/30/2019  . Kiwi extract Nausea Only 01/13/2020    Family History  Problem Relation Age of Onset  . Cancer Mother        breast  . Kidney failure Father   . Cancer Maternal Grandmother        colon  . Colon cancer Maternal Grandmother   . Cancer Paternal Grandmother        ovarian  . Cancer Sister        melanoma    Social History   Socioeconomic History  . Marital status: Married    Spouse name: Not  on file  . Number of children: Not on file  . Years of education: Not on file  . Highest education level: Not on file  Occupational History  . Not on file  Tobacco Use  . Smoking status: Never Smoker  . Smokeless tobacco: Never Used  Substance and Sexual Activity  . Alcohol use: No  . Drug use: No  . Sexual activity: Yes    Birth control/protection: Other-see comments, Surgical    Comment: Pt had chemo and no longer has a period.  Other Topics Concern  . Not on file  Social History Narrative  . Not on file   Social Determinants of Health   Financial Resource Strain:   . Difficulty of Paying Living Expenses: Not on file  Food Insecurity:   . Worried About Charity fundraiser in the Last Year: Not on file  . Ran Out of Food in the Last Year: Not on file  Transportation Needs:   . Lack of Transportation (Medical): Not on file  . Lack of Transportation (Non-Medical): Not on file  Physical Activity:   . Days of Exercise per Week: Not on file  . Minutes of Exercise per Session: Not on file  Stress:   . Feeling of Stress : Not on file  Social Connections:   . Frequency of Communication with Friends and Family: Not on file  . Frequency of Social Gatherings with Friends and Family: Not on file  . Attends Religious Services: Not on file  . Active Member of Clubs or Organizations: Not on file  . Attends Archivist Meetings: Not on file  . Marital Status: Not on file    Review of Systems: Gen: Denies fever, chills, anorexia. Denies fatigue, weakness, weight loss.  CV: Denies chest pain, palpitations, syncope, peripheral edema, and claudication. Resp: Denies dyspnea at rest, cough, wheezing, coughing up blood, and pleurisy. GI: see HPI Derm: Denies rash, itching, dry skin Psych: Denies depression, anxiety, memory loss, confusion. No homicidal or suicidal ideation.  Heme: Denies bruising, bleeding, and enlarged lymph nodes.  Physical Exam: BP 122/75   Pulse 77    Temp (!) 97.5 F (36.4 C)   Ht $R'5\' 4"'Sn$  (1.626 m)   Wt 171 lb 3.2 oz (77.7 kg)   BMI 29.39 kg/m  General:   Alert and oriented. No distress noted. Pleasant and cooperative.  Head:  Normocephalic and atraumatic. Eyes:  Conjuctiva clear without scleral icterus. Mouth:  Mask in place Abdomen:  +BS, soft, non-tender and non-distended. No rebound or guarding. No HSM or masses noted. Msk:  Symmetrical without gross deformities. Normal posture. Extremities:  Without edema. Neurologic:  Alert and  oriented x4 Psych:  Alert and cooperative. Normal mood and affect.  ASSESSMENT/PLAN: Vanessa Neal is a 59 y.o. female presenting today with history of lymphocytic colitis and responding well to course of budesonide therapy with taper. Subsequently, she underwent hemorrhoid banding X 1 and has had complete resolution of symptomatic hemorrhoids. At this point, we will favor a conservative approach and see her back in 6-8 months. She is to call if any recurrent issues with hemorrhoids, and we will see her sooner.   Annitta Needs, PhD, ANP-BC [facility]

## 2020-04-08 ENCOUNTER — Ambulatory Visit (HOSPITAL_COMMUNITY): Payer: BC Managed Care – PPO

## 2020-04-08 ENCOUNTER — Other Ambulatory Visit (HOSPITAL_COMMUNITY): Payer: Self-pay

## 2020-04-08 ENCOUNTER — Encounter (HOSPITAL_COMMUNITY): Payer: Self-pay

## 2020-04-08 ENCOUNTER — Inpatient Hospital Stay (HOSPITAL_COMMUNITY): Payer: BC Managed Care – PPO

## 2020-04-08 ENCOUNTER — Other Ambulatory Visit (HOSPITAL_COMMUNITY): Payer: BC Managed Care – PPO

## 2020-04-08 DIAGNOSIS — R7309 Other abnormal glucose: Secondary | ICD-10-CM

## 2020-04-08 NOTE — Progress Notes (Signed)
Received order for labs from Vanessa Neal For CBC w/ diff, Lipid panel, CMP, and Hgb A1c. Labs ordered and patient is aware.

## 2020-04-09 ENCOUNTER — Inpatient Hospital Stay (HOSPITAL_COMMUNITY): Payer: BC Managed Care – PPO | Attending: Hematology

## 2020-04-09 ENCOUNTER — Other Ambulatory Visit (HOSPITAL_COMMUNITY)
Admission: RE | Admit: 2020-04-09 | Discharge: 2020-04-09 | Disposition: A | Discharge status: Home / Self Care | Payer: BC Managed Care – PPO | Source: Ambulatory Visit | Attending: Internal Medicine | Admitting: Internal Medicine

## 2020-04-09 ENCOUNTER — Other Ambulatory Visit: Payer: Self-pay

## 2020-04-09 DIAGNOSIS — M8580 Other specified disorders of bone density and structure, unspecified site: Secondary | ICD-10-CM | POA: Diagnosis not present

## 2020-04-09 DIAGNOSIS — C50911 Malignant neoplasm of unspecified site of right female breast: Secondary | ICD-10-CM

## 2020-04-09 DIAGNOSIS — R7309 Other abnormal glucose: Secondary | ICD-10-CM | POA: Insufficient documentation

## 2020-04-09 DIAGNOSIS — Z853 Personal history of malignant neoplasm of breast: Secondary | ICD-10-CM | POA: Insufficient documentation

## 2020-04-09 LAB — COMPREHENSIVE METABOLIC PANEL
ALT: 24 U/L (ref 0–44)
AST: 26 U/L (ref 15–41)
Albumin: 4.2 g/dL (ref 3.5–5.0)
Alkaline Phosphatase: 38 U/L (ref 38–126)
Anion gap: 9 (ref 5–15)
BUN: 21 mg/dL — ABNORMAL HIGH (ref 6–20)
CO2: 27 mmol/L (ref 22–32)
Calcium: 9.4 mg/dL (ref 8.9–10.3)
Chloride: 99 mmol/L (ref 98–111)
Creatinine, Ser: 0.93 mg/dL (ref 0.44–1.00)
GFR, Estimated: >60 mL/min (ref >60)
Glucose, Bld: 100 mg/dL — ABNORMAL HIGH (ref 70–99)
Potassium: 4.1 mmol/L (ref 3.5–5.1)
Sodium: 135 mmol/L (ref 135–145)
Total Bilirubin: 0.9 mg/dL (ref 0.3–1.2)
Total Protein: 7.8 g/dL (ref 6.5–8.1)

## 2020-04-09 LAB — CBC WITH DIFFERENTIAL/PLATELET
Abs Immature Granulocytes: 0.02 10*3/uL (ref 0.00–0.07)
Basophils Absolute: 0 10*3/uL (ref 0.0–0.1)
Basophils Relative: 1 %
Eosinophils Absolute: 0.1 10*3/uL (ref 0.0–0.5)
Eosinophils Relative: 2 %
HCT: 41.5 % (ref 36.0–46.0)
Hemoglobin: 14.5 g/dL (ref 12.0–15.0)
Immature Granulocytes: 0 %
Lymphocytes Relative: 45 %
Lymphs Abs: 2.8 10*3/uL (ref 0.7–4.0)
MCH: 35.4 pg — ABNORMAL HIGH (ref 26.0–34.0)
MCHC: 34.9 g/dL (ref 30.0–36.0)
MCV: 101.2 fL — ABNORMAL HIGH (ref 80.0–100.0)
Monocytes Absolute: 0.4 10*3/uL (ref 0.1–1.0)
Monocytes Relative: 7 %
Neutro Abs: 2.8 10*3/uL (ref 1.7–7.7)
Neutrophils Relative %: 45 %
Platelets: 294 10*3/uL (ref 150–400)
RBC: 4.1 MIL/uL (ref 3.87–5.11)
RDW: 12.2 % (ref 11.5–15.5)
WBC: 6.3 10*3/uL (ref 4.0–10.5)
nRBC: 0 % (ref 0.0–0.2)

## 2020-04-09 LAB — LIPID PANEL
Cholesterol: 206 mg/dL — ABNORMAL HIGH (ref 0–200)
HDL: 44 mg/dL (ref >40)
LDL Cholesterol: 136 mg/dL — ABNORMAL HIGH (ref 0–99)
Total CHOL/HDL Ratio: 4.7 RATIO
Triglycerides: 131 mg/dL (ref <150)
VLDL: 26 mg/dL (ref 0–40)

## 2020-04-09 LAB — HEMOGLOBIN A1C
Hgb A1c MFr Bld: 6 % — ABNORMAL HIGH (ref 4.8–5.6)
Mean Plasma Glucose: 125.5 mg/dL

## 2020-04-09 LAB — VITAMIN D 25 HYDROXY (VIT D DEFICIENCY, FRACTURES): Vit D, 25-Hydroxy: 51.79 ng/mL (ref 30–100)

## 2020-04-09 MED ORDER — ACETAMINOPHEN 325 MG PO TABS
ORAL_TABLET | ORAL | Status: AC
  Filled 2020-04-09: qty 1

## 2020-04-09 NOTE — Progress Notes (Signed)
Cc'ed to pcp °

## 2020-04-12 ENCOUNTER — Ambulatory Visit (HOSPITAL_COMMUNITY): Payer: BC Managed Care – PPO | Admitting: Hematology

## 2020-04-12 ENCOUNTER — Other Ambulatory Visit: Payer: Self-pay

## 2020-04-12 ENCOUNTER — Ambulatory Visit (HOSPITAL_COMMUNITY)
Admission: RE | Admit: 2020-04-12 | Discharge: 2020-04-12 | Disposition: A | Discharge status: Home / Self Care | Payer: BC Managed Care – PPO | Source: Ambulatory Visit | Attending: Hematology | Admitting: Hematology

## 2020-04-12 DIAGNOSIS — Z1231 Encounter for screening mammogram for malignant neoplasm of breast: Secondary | ICD-10-CM | POA: Diagnosis not present

## 2020-04-12 DIAGNOSIS — M85852 Other specified disorders of bone density and structure, left thigh: Secondary | ICD-10-CM | POA: Diagnosis not present

## 2020-04-12 DIAGNOSIS — Z780 Asymptomatic menopausal state: Secondary | ICD-10-CM | POA: Insufficient documentation

## 2020-04-13 ENCOUNTER — Ambulatory Visit (HOSPITAL_COMMUNITY): Payer: BC Managed Care – PPO | Admitting: Nurse Practitioner

## 2020-04-14 ENCOUNTER — Inpatient Hospital Stay (HOSPITAL_BASED_OUTPATIENT_CLINIC_OR_DEPARTMENT_OTHER): Payer: BC Managed Care – PPO | Admitting: Hematology

## 2020-04-14 ENCOUNTER — Other Ambulatory Visit (HOSPITAL_COMMUNITY): Payer: Self-pay | Admitting: Hematology

## 2020-04-14 ENCOUNTER — Other Ambulatory Visit: Payer: Self-pay

## 2020-04-14 ENCOUNTER — Ambulatory Visit: Payer: Self-pay | Admitting: Nurse Practitioner

## 2020-04-14 ENCOUNTER — Ambulatory Visit (HOSPITAL_COMMUNITY): Payer: BC Managed Care – PPO | Admitting: Nurse Practitioner

## 2020-04-14 VITALS — BP 126/70 | HR 79 | Temp 96.9°F | Resp 17 | Wt 170.8 lb

## 2020-04-14 DIAGNOSIS — C50911 Malignant neoplasm of unspecified site of right female breast: Secondary | ICD-10-CM | POA: Diagnosis not present

## 2020-04-14 DIAGNOSIS — Z853 Personal history of malignant neoplasm of breast: Secondary | ICD-10-CM | POA: Diagnosis not present

## 2020-04-14 DIAGNOSIS — M8580 Other specified disorders of bone density and structure, unspecified site: Secondary | ICD-10-CM | POA: Diagnosis not present

## 2020-04-14 DIAGNOSIS — Z1231 Encounter for screening mammogram for malignant neoplasm of breast: Secondary | ICD-10-CM

## 2020-04-14 NOTE — Patient Instructions (Addendum)
[place] at [facility] Discharge Instructions  You were seen today by Dr. Delton Coombes. He went over your recent results and scans. You will be scheduled for a mammogram in November 2022. Dr. Delton Coombes will see you back in 1 year for labs and follow up.   Thank you for choosing [place] at [facility] to provide your oncology and hematology care.  To afford each patient quality time with our provider, please arrive at least 15 minutes before your scheduled appointment time.   If you have a lab appointment with the [place] please come in thru the Main Entrance and check in at the main information desk  You need to re-schedule your appointment should you arrive 10 or more minutes late.  We strive to give you quality time with our providers, and arriving late affects you and other patients whose appointments are after yours.  Also, if you no show three or more times for appointments you may be dismissed from the clinic at the providers discretion.     Again, thank you for choosing [facility].  Our hope is that these requests will decrease the amount of time that you wait before being seen by our physicians.       _____________________________________________________________  Should you have questions after your visit to [facility], please contact our office at [phone] between the hours of 8:00 a.m. and 4:30 p.m.  Voicemails left after 4:00 p.m. will not be returned until the following business day.  For prescription refill requests, have your pharmacy contact our office and allow 72 hours.    Cancer Center Support Programs:   > Cancer Support Group  2nd Tuesday of the month 1pm-2pm, Journey Room

## 2020-04-14 NOTE — Progress Notes (Signed)
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 Pablo Ledger at [facility]   07/05/2010 - 08/18/2014 Chemotherapy   Tamoxifen 20 mg daily   08/19/2014 -  Anti-estrogen oral therapy   Aromasin daily   09/29/2014 Survivorship   Prolia started for osteopenia in the setting of Aromatase inhibitor.   10/08/2014 Pathology Results   BCI- low risk of late recurrence (2.7% btweenyrs 5-10), high likelihood of benefit from extended endocrine therapy, high relative risk reduction (16.5% absolute benefit), low risk of overall recurrence (4.8% btween yrs 0-10), and high benefit from EET.     CHIEF COMPLIANT: Follow-up for right breast cancer   INTERVAL HISTORY: [name] is a 59 y.o. female here today for follow up of her right breast cancer. Her last visit was on 04/10/2019.   Today she reports feeling well. She was started on Farxiga by Dr. Nevada Crane and she reports having occasional diarrhea from it. She is taking calcium and vitamin D. She had a mammogram done on 11/15.  She started walking more and tracking her sugar intake.   REVIEW OF SYSTEMS:   Review of Systems  Constitutional: Positive for appetite change (90%) and fatigue (90%).    Gastrointestinal: Positive for diarrhea.  All other systems reviewed and are negative.   I have reviewed the past medical history, past surgical history, social history and family history with the patient and they are unchanged from previous note.   ALLERGIES:   is allergic to banana and kiwi extract.   MEDICATIONS:  Current Outpatient Medications  Medication Sig Dispense Refill  . Calcium Carb-Cholecalciferol (CALCIUM 600 + D PO) Take 1 tablet by mouth in the morning and at bedtime.     . cetirizine (ZYRTEC) 10 MG tablet Take 10 mg by mouth daily. Takes Mon, Wed, and Fri    . diphenhydramine-acetaminophen (TYLENOL PM) 25-500 MG TABS tablet Take 1 tablet by mouth as needed (sleep).     Marland Kitchen FARXIGA 5 MG TABS tablet Take 5 mg by mouth daily.    . hydrochlorothiazide (HYDRODIURIL) 12.5 MG tablet Take 12.5 mg by mouth daily.    . Multiple Vitamins-Calcium (ONE-A-DAY WOMENS PO) Take 1 tablet by mouth daily.     . pravastatin (PRAVACHOL) 10 MG tablet Take 10 mg by mouth daily.    . Probiotic Product (ALIGN PO) Take 1 capsule by mouth daily.     Marland Kitchen venlafaxine (EFFEXOR) 37.5 MG tablet TAKE (1) TABLET BY MOUTH THREE TIMES DAILY WITH FOOD. (Patient taking differently: Take 37.5 mg by mouth daily. ) 90 tablet 5   No current facility-administered medications for this visit.     PHYSICAL EXAMINATION: Performance status (ECOG): 0 - Asymptomatic  Vitals:   04/14/20 1439  BP: 126/70  Pulse: 79  Resp: 17  Temp: (!)  96.9 F (36.1 C)  SpO2: 96%   Wt Readings from Last 3 Encounters:  04/14/20 170 lb 12.8 oz (77.5 kg)  04/06/20 171 lb 3.2 oz (77.7 kg)  02/18/20 175 lb 12.8 oz (79.7 kg)   Physical Exam Vitals reviewed.  Constitutional:      Appearance: Normal appearance.  Cardiovascular:     Rate and Rhythm: Normal rate and regular rhythm.     Pulses: Normal pulses.     Heart sounds: Normal heart sounds.  Pulmonary:     Effort: Pulmonary effort is normal.     Breath sounds: Normal  breath sounds.  Chest:     Breasts:        Right: No inverted nipple, mass, nipple discharge or skin change (lower-outer quadrant scar well-healed).        Left: No inverted nipple, mass, nipple discharge or skin change.  Abdominal:     Palpations: Abdomen is soft. There is no hepatomegaly, splenomegaly or mass.     Tenderness: There is no abdominal tenderness.  Musculoskeletal:     Right lower leg: No edema.     Left lower leg: No edema.  Lymphadenopathy:     Cervical: No cervical adenopathy.     Upper Body:     Right upper body: No supraclavicular, axillary or pectoral adenopathy.     Left upper body: No supraclavicular, axillary or pectoral adenopathy.  Neurological:     General: No focal deficit present.     Mental Status: She is alert and oriented to person, place, and time.  Psychiatric:        Mood and Affect: Mood normal.        Behavior: Behavior normal.     Breast Exam Chaperone: Milinda Antis, MD     LABORATORY DATA:  I have reviewed the data as listed CMP Latest Ref Rng & Units 04/09/2020 04/07/2019 12/12/2017  Glucose 70 - 99 mg/dL 100(H) 125(H) 90  BUN 6 - 20 mg/dL 21(H) 20 15  Creatinine 0.44 - 1.00 mg/dL 0.93 0.73 0.68  Sodium 135 - 145 mmol/L 135 136 138  Potassium 3.5 - 5.1 mmol/L 4.1 3.5 5.2  Chloride 98 - 111 mmol/L 99 101 102  CO2 22 - 32 mmol/L $RemoveB'27 26 21  'BcVBnhHb$ Calcium 8.9 - 10.3 mg/dL 9.4 8.9 9.8  Total Protein 6.5 - 8.1 g/dL 7.8 7.9 7.4  Total Bilirubin 0.3 - 1.2 mg/dL 0.9 0.7 0.4  Alkaline Phos 38 - 126 U/L 38 45 59  AST 15 - 41 U/L 26 32 26  ALT 0 - 44 U/L 24 36 22   No results found for: [id number] Lab Results  Component Value Date   WBC 6.3 04/09/2020   HGB 14.5 04/09/2020   HCT 41.5 04/09/2020   MCV 101.2 (H) 04/09/2020   PLT 294 04/09/2020   NEUTROABS 2.8 04/09/2020   Lab Results  Component Value Date   VD25OH 51.79 04/09/2020   VD25OH 48.9 04/05/2016   VD25OH 46.6 08/18/2014    ASSESSMENT:  1.  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 -Mammogram on 04/07/2019 was BI-RADS Category 2.  2.  Osteopenia: -DEXA scan on 04/12/2020 with T score -1.8. -DEXA scan on 09/14/2016 with T score of -1.8 in the left femoral neck. -DEXA scan on 08/31/2014 with T score of -1.8 in the left femoral neck. -She received 3 doses of Prolia, last dose on 09/29/2015.   PLAN:  1.  Right breast cancer: -Physical exam today shows normal lumpectomy scar around the nipple with no palpable masses or lymph nodes. -Reviewed labs from 04/09/2020 which were grossly within normal limits. -She had mammogram on 04/12/2020.  Report is not available yet. -Recommend follow-up in 1 year after repeat mammogram.  2.  Osteopenia: -Reviewed DEXA scan results from 04/12/2020 which is stable.  Continue calcium and vitamin D supplements.  Vitamin D was normal.   Breast Cancer therapy associated bone loss: I have recommended calcium, Vitamin D and weight bearing exercises.   No orders of the defined types were placed in this encounter.  The patient has a good understanding of the overall plan. she agrees with it. she will call with any problems that may develop before the next visit here.    Derek Jack, MD [place] [phone]   I, Milinda Antis, am acting as a scribe for Dr. Sanda Linger.  I, Derek Jack MD, have reviewed the above documentation  for accuracy and completeness, and I agree with the above.

## 2020-07-01 DIAGNOSIS — R0981 Nasal congestion: Secondary | ICD-10-CM | POA: Diagnosis not present

## 2020-07-01 DIAGNOSIS — R944 Abnormal results of kidney function studies: Secondary | ICD-10-CM | POA: Diagnosis not present

## 2020-07-01 DIAGNOSIS — I10 Essential (primary) hypertension: Secondary | ICD-10-CM | POA: Diagnosis not present

## 2020-07-01 DIAGNOSIS — E1165 Type 2 diabetes mellitus with hyperglycemia: Secondary | ICD-10-CM | POA: Diagnosis not present

## 2020-08-16 DIAGNOSIS — Z853 Personal history of malignant neoplasm of breast: Secondary | ICD-10-CM | POA: Diagnosis not present

## 2020-08-16 DIAGNOSIS — Z6833 Body mass index (BMI) 33.0-33.9, adult: Secondary | ICD-10-CM | POA: Diagnosis not present

## 2020-08-16 DIAGNOSIS — R1030 Lower abdominal pain, unspecified: Secondary | ICD-10-CM | POA: Diagnosis not present

## 2020-08-16 DIAGNOSIS — K52832 Lymphocytic colitis: Secondary | ICD-10-CM | POA: Diagnosis not present

## 2020-08-16 DIAGNOSIS — E6609 Other obesity due to excess calories: Secondary | ICD-10-CM | POA: Diagnosis not present

## 2020-08-16 DIAGNOSIS — R0981 Nasal congestion: Secondary | ICD-10-CM | POA: Diagnosis not present

## 2020-08-16 DIAGNOSIS — E782 Mixed hyperlipidemia: Secondary | ICD-10-CM | POA: Diagnosis not present

## 2020-08-16 DIAGNOSIS — I10 Essential (primary) hypertension: Secondary | ICD-10-CM | POA: Diagnosis not present

## 2020-08-16 DIAGNOSIS — R7301 Impaired fasting glucose: Secondary | ICD-10-CM | POA: Diagnosis not present

## 2020-08-19 DIAGNOSIS — E1165 Type 2 diabetes mellitus with hyperglycemia: Secondary | ICD-10-CM | POA: Diagnosis not present

## 2020-08-19 DIAGNOSIS — R944 Abnormal results of kidney function studies: Secondary | ICD-10-CM | POA: Diagnosis not present

## 2020-08-19 DIAGNOSIS — E782 Mixed hyperlipidemia: Secondary | ICD-10-CM | POA: Diagnosis not present

## 2020-08-19 DIAGNOSIS — R1030 Lower abdominal pain, unspecified: Secondary | ICD-10-CM | POA: Diagnosis not present

## 2020-08-19 DIAGNOSIS — I10 Essential (primary) hypertension: Secondary | ICD-10-CM | POA: Diagnosis not present

## 2020-08-26 ENCOUNTER — Encounter: Payer: Self-pay | Admitting: Internal Medicine

## 2020-09-20 DIAGNOSIS — D485 Neoplasm of uncertain behavior of skin: Secondary | ICD-10-CM | POA: Diagnosis not present

## 2020-09-20 DIAGNOSIS — D225 Melanocytic nevi of trunk: Secondary | ICD-10-CM | POA: Diagnosis not present

## 2020-09-20 DIAGNOSIS — Z1283 Encounter for screening for malignant neoplasm of skin: Secondary | ICD-10-CM | POA: Diagnosis not present

## 2020-09-20 DIAGNOSIS — S70362A Insect bite (nonvenomous), left thigh, initial encounter: Secondary | ICD-10-CM | POA: Diagnosis not present

## 2020-10-11 DIAGNOSIS — D485 Neoplasm of uncertain behavior of skin: Secondary | ICD-10-CM | POA: Diagnosis not present

## 2020-10-11 DIAGNOSIS — L988 Other specified disorders of the skin and subcutaneous tissue: Secondary | ICD-10-CM | POA: Diagnosis not present

## 2020-11-15 DIAGNOSIS — E1165 Type 2 diabetes mellitus with hyperglycemia: Secondary | ICD-10-CM | POA: Diagnosis not present

## 2020-11-15 DIAGNOSIS — I10 Essential (primary) hypertension: Secondary | ICD-10-CM | POA: Diagnosis not present

## 2020-11-17 DIAGNOSIS — Z0001 Encounter for general adult medical examination with abnormal findings: Secondary | ICD-10-CM | POA: Diagnosis not present

## 2021-02-17 DIAGNOSIS — E1165 Type 2 diabetes mellitus with hyperglycemia: Secondary | ICD-10-CM | POA: Diagnosis not present

## 2021-02-17 DIAGNOSIS — I10 Essential (primary) hypertension: Secondary | ICD-10-CM | POA: Diagnosis not present

## 2021-02-17 DIAGNOSIS — E538 Deficiency of other specified B group vitamins: Secondary | ICD-10-CM | POA: Diagnosis not present

## 2021-02-22 DIAGNOSIS — D539 Nutritional anemia, unspecified: Secondary | ICD-10-CM | POA: Diagnosis not present

## 2021-02-22 DIAGNOSIS — E782 Mixed hyperlipidemia: Secondary | ICD-10-CM | POA: Diagnosis not present

## 2021-02-22 DIAGNOSIS — I10 Essential (primary) hypertension: Secondary | ICD-10-CM | POA: Diagnosis not present

## 2021-02-22 DIAGNOSIS — E1165 Type 2 diabetes mellitus with hyperglycemia: Secondary | ICD-10-CM | POA: Diagnosis not present

## 2021-03-14 DIAGNOSIS — M545 Low back pain, unspecified: Secondary | ICD-10-CM | POA: Diagnosis not present

## 2021-03-15 DIAGNOSIS — N390 Urinary tract infection, site not specified: Secondary | ICD-10-CM | POA: Diagnosis not present

## 2021-04-01 ENCOUNTER — Other Ambulatory Visit (HOSPITAL_COMMUNITY): Payer: Self-pay | Admitting: Hematology

## 2021-04-01 DIAGNOSIS — Z1231 Encounter for screening mammogram for malignant neoplasm of breast: Secondary | ICD-10-CM

## 2021-04-13 ENCOUNTER — Other Ambulatory Visit (HOSPITAL_COMMUNITY): Payer: BC Managed Care – PPO

## 2021-04-14 ENCOUNTER — Inpatient Hospital Stay (HOSPITAL_COMMUNITY): Payer: BC Managed Care – PPO | Attending: Hematology

## 2021-04-14 ENCOUNTER — Ambulatory Visit (HOSPITAL_COMMUNITY): Payer: BC Managed Care – PPO

## 2021-04-18 ENCOUNTER — Ambulatory Visit (HOSPITAL_COMMUNITY): Payer: BC Managed Care – PPO | Admitting: Hematology

## 2021-04-18 ENCOUNTER — Ambulatory Visit (HOSPITAL_COMMUNITY)
Admission: RE | Admit: 2021-04-18 | Discharge: 2021-04-18 | Disposition: A | Discharge status: Home / Self Care | Payer: BC Managed Care – PPO | Source: Ambulatory Visit | Attending: Hematology | Admitting: Hematology

## 2021-04-18 ENCOUNTER — Other Ambulatory Visit: Payer: Self-pay

## 2021-04-18 DIAGNOSIS — Z1231 Encounter for screening mammogram for malignant neoplasm of breast: Secondary | ICD-10-CM | POA: Diagnosis not present

## 2021-04-28 ENCOUNTER — Ambulatory Visit (HOSPITAL_COMMUNITY): Payer: BC Managed Care – PPO | Admitting: Hematology

## 2021-04-28 ENCOUNTER — Ambulatory Visit (HOSPITAL_COMMUNITY): Payer: BC Managed Care – PPO | Admitting: Physician Assistant

## 2021-04-29 ENCOUNTER — Other Ambulatory Visit (HOSPITAL_COMMUNITY): Payer: Self-pay | Admitting: *Deleted

## 2021-04-29 DIAGNOSIS — R5383 Other fatigue: Secondary | ICD-10-CM

## 2021-04-29 DIAGNOSIS — C50911 Malignant neoplasm of unspecified site of right female breast: Secondary | ICD-10-CM

## 2021-04-29 NOTE — Progress Notes (Signed)
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 Pablo Ledger at [facility]   07/05/2010 - 08/18/2014 Chemotherapy   Tamoxifen 20 mg daily   08/19/2014 -  Anti-estrogen oral therapy   Aromasin daily   09/29/2014 Survivorship   Prolia started for osteopenia in the setting of Aromatase inhibitor.   10/08/2014 Pathology Results   BCI- low risk of late recurrence (2.7% btweenyrs 5-10), high likelihood of benefit from extended endocrine therapy, high relative risk reduction (16.5% absolute benefit), low risk of overall recurrence (4.8% btween yrs 0-10), and high benefit from EET.     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 female, returns for routine follow-up of her stage IIa right-sided breast cancer. [name] was last seen on 04/14/2020 by Dr. Delton Coombes.  At today's visit, she reports feeling well.  She denies any recent hospitalizations, surgeries, or changes in her baseline health status.  Most recent mammogram on 04/18/2021 showed no evidence of malignancy.  She denies any symptoms of recurrence such as new  lumps, bone pain, chest pain, dyspnea, or abdominal pain.  She has no new headaches, seizures, or focal neurologic deficits.  No B symptoms such as fever, chills, night sweats, unintentional weight loss.  She reports 75% energy and 100% appetite.  She is maintaining stable weight at this time, but is working on intentional weight loss through healthy lifestyle modifications.   REVIEW OF SYSTEMS:  Review of Systems  Constitutional:  Negative for appetite change, chills, diaphoresis, fatigue, fever and unexpected weight change.  HENT:   Negative for lump/mass and nosebleeds.   Eyes:  Negative for eye problems.  Respiratory:  Negative for cough, hemoptysis and shortness of breath.   Cardiovascular:  Negative for chest pain, leg swelling and palpitations.  Gastrointestinal:  Positive for diarrhea (Occasional). Negative for abdominal pain, blood in stool, constipation, nausea and vomiting.  Genitourinary:  Negative for hematuria.   Skin: Negative.   Neurological:  Positive for headaches (Occasional). Negative for dizziness and light-headedness.  Hematological:  Does not bruise/bleed easily.   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 ([place]) 01/04/2011   Ovarian cyst    Personal history of chemotherapy    Personal history of radiation therapy    Right thyroid nodule    Past Surgical History:  Procedure Laterality Date   ABDOMINAL HYSTERECTOMY N/A 06/16/2014   Procedure: HYSTERECTOMY ABDOMINAL;  Surgeon:  Jonnie Kind, MD;  Location: [facility];  Service: Gynecology;  Laterality: N/A;   BIOPSY  11/07/2019   Procedure: BIOPSY;  Surgeon: Daneil Dolin, MD;  Location: [facility] SU[facility];  Service: Endoscopy;;   BREAST CYST ASPIRATION  01/2012   BREAST CYST EXCISION     BREAST LUMPECTOMY      right   CESAREAN SECTION     X 2   CHOLECYSTECTOMY     COLONOSCOPY  03/11/2012   Procedure: COLONOSCOPY;  Surgeon: Danie Binder, MD;  Location: [facility];  Service: Endoscopy;  Laterality: N/A;  9:30 AM-changed to 9:50 Doris notified   COLONOSCOPY N/A 11/07/2019   Normal colon and TI. Non-bleeding internal hemorrhoids. S/p biopsy. Lymphocytic colitis. Prescribed budesonide.    LIPOMA EXCISION     from back   SALPINGOOPHORECTOMY Bilateral 06/16/2014   Procedure: BILATERAL SALPINGO OOPHORECTOMY;  Surgeon: Jonnie Kind, MD;  Location: [facility];  Service: Gynecology;  Laterality: Bilateral;   SCAR REVISION N/A 06/16/2014   Procedure: SCAR REVISION;  Surgeon: Jonnie Kind, MD;  Location: [facility];  Service: Gynecology;  Laterality: N/A;    SOCIAL HISTORY:  Social History   Socioeconomic History   Marital status: Married    Spouse name: Not on file   Number of children: Not on file   Years of education: Not on file   Highest education level: Not on file  Occupational History   Not on file  Tobacco Use   Smoking status: Never   Smokeless tobacco: Never  Substance and Sexual Activity   Alcohol use: No   Drug use: No   Sexual activity: Yes    Birth control/protection: Other-see comments, Surgical    Comment: Pt had chemo and no longer has a period.  Other Topics Concern   Not on file  Social History Narrative   Not on file   Social Determinants of Health   Financial Resource Strain: Not on file  Food Insecurity: Not on file  Transportation Needs: Not on file  Physical Activity: Not on file  Stress: Not on file  Social Connections: Not on file  Intimate Partner Violence: Not on file    FAMILY HISTORY:  Family History  Problem Relation Age of Onset   Cancer Mother        breast   Kidney failure Father    Cancer Maternal Grandmother        colon   Colon cancer Maternal Grandmother    Cancer Paternal Grandmother        ovarian   Cancer Sister        melanoma     CURRENT MEDICATIONS:  Current Outpatient Medications  Medication Sig Dispense Refill   Calcium Carb-Cholecalciferol (CALCIUM 600 + D PO) Take 1 tablet by mouth in the morning and at bedtime.      cetirizine (ZYRTEC) 10 MG tablet Take 10 mg by mouth daily. Takes Mon, Wed, and Fri     diphenhydramine-acetaminophen (TYLENOL PM) 25-500 MG TABS tablet Take 1 tablet by mouth as needed (sleep).      FARXIGA 5 MG TABS tablet Take 5 mg by mouth daily.     hydrochlorothiazide (HYDRODIURIL) 12.5 MG tablet Take 12.5 mg by mouth daily.     Multiple Vitamins-Calcium (ONE-A-DAY WOMENS PO) Take 1 tablet by mouth daily.      pravastatin (PRAVACHOL) 10 MG tablet Take 10 mg by mouth daily.     Probiotic Product (ALIGN PO) Take 1 capsule by mouth  daily.      venlafaxine (EFFEXOR) 37.5 MG tablet TAKE (1) TABLET BY MOUTH THREE TIMES DAILY WITH FOOD. (Patient taking differently: Take 37.5 mg by mouth daily. ) 90 tablet 5   No current facility-administered medications for this visit.    ALLERGIES:  Allergies  Allergen Reactions   Banana Nausea Only   Kiwi Extract Nausea Only    PHYSICAL EXAM:  Performance status (ECOG): 0 - Asymptomatic  There were no vitals filed for this visit. Wt Readings from Last 3 Encounters:  04/14/20 170 lb 12.8 oz (77.5 kg)  04/06/20 171 lb 3.2 oz (77.7 kg)  02/18/20 175 lb 12.8 oz (79.7 kg)   Physical Exam Constitutional:      Appearance: Normal appearance.  HENT:     Head: Normocephalic and atraumatic.     Mouth/Throat:     Mouth: Mucous membranes are moist.  Eyes:     Extraocular Movements: Extraocular movements intact.     Pupils: Pupils are equal, round, and reactive to light.  Cardiovascular:     Rate and Rhythm: Normal rate and regular rhythm.     Pulses: Normal pulses.     Heart sounds: Normal heart sounds.  Pulmonary:     Effort: Pulmonary effort is normal.     Breath sounds: Normal breath sounds.  Chest:    Abdominal:     General: Bowel sounds  are normal.     Palpations: Abdomen is soft.     Tenderness: There is no abdominal tenderness.  Musculoskeletal:        General: No swelling.     Right lower leg: No edema.     Left lower leg: No edema.  Lymphadenopathy:     Cervical: No cervical adenopathy.  Skin:    General: Skin is warm and dry.  Neurological:     General: No focal deficit present.     Mental Status: She is alert and oriented to person, place, and time.  Psychiatric:        Mood and Affect: Mood normal.        Behavior: Behavior normal.     LABORATORY DATA:  I have reviewed the labs as listed.  CBC Latest Ref Rng & Units 04/09/2020 04/07/2019 12/12/2017  WBC 4.0 - 10.5 K/uL 6.3 7.0 6.7  Hemoglobin 12.0 - 15.0 g/dL 14.5 13.4 13.0  Hematocrit 36.0 - 46.0 % 41.5 39.9 37.0  Platelets 150 - 400 K/uL 294 291 282   CMP Latest Ref Rng & Units 04/09/2020 04/07/2019 12/12/2017  Glucose 70 - 99 mg/dL 100(H) 125(H) 90  BUN 6 - 20 mg/dL 21(H) 20 15  Creatinine 0.44 - 1.00 mg/dL 0.93 0.73 0.68  Sodium 135 - 145 mmol/L 135 136 138  Potassium 3.5 - 5.1 mmol/L 4.1 3.5 5.2  Chloride 98 - 111 mmol/L 99 101 102  CO2 22 - 32 mmol/L _0 Calcium 8.9 - 10.3 mg/dL 9.4 8.9 9.8  Total Protein 6.5 - 8.1 g/dL 7.8 7.9 7.4  Total Bilirubin 0.3 - 1.2 mg/dL 0.9 0.7 0.4  Alkaline Phos 38 - 126 U/L 38 45 59  AST 15 - 41 U/L 26 32 26  ALT 0 - 44 U/L 24 36 22    DIAGNOSTIC IMAGING:  I have independently reviewed the scans and discussed with the patient. MM 3D SCREEN BREAST BILATERAL  Result Date: 04/19/2021 CLINICAL DATA:  Screening. EXAM: DIGITAL SCREENING BILATERAL MAMMOGRAM WITH TOMOSYNTHESIS AND CAD TECHNIQUE: Bilateral screening digital craniocaudal and mediolateral oblique mammograms  were obtained. Bilateral screening digital breast tomosynthesis was performed. The images were evaluated with computer-aided detection. COMPARISON:  Previous exam(s). ACR Breast Density Category c: The breast tissue is heterogeneously dense, which  may obscure small masses. FINDINGS: There are no findings suspicious for malignancy. IMPRESSION: No mammographic evidence of malignancy. A result letter of this screening mammogram will be mailed directly to the patient. RECOMMENDATION: Screening mammogram in one year. (Code:SM-B-01Y) BI-RADS CATEGORY  1: Negative. Electronically Signed   By: Ammie Ferrier M.D.   On: 04/19/2021 11:12      ASSESSMENT & PLAN: 1.  Right breast cancer, stage IIa: - Status post neoadjuvant chemotherapy with epirubicin, Cytoxan from 10/19/2009 through 12/27/2009 followed by 12 weekly doses of Taxol completed on 03/28/2010 - Right lumpectomy on 04/13/2010, ER/PR positive, HER-2 negative, 1.1 cm IDC, grade 2, 3 out of 3 lymph nodes negative, pT1c,pN0, ER/PR positive and HER-2 negative, Ki-67 of 85% - Radiation therapy to the right breast completed on 07/04/2010 -Tamoxifen from 07/05/2010 through 08/18/2014 followed by Aromasin for 1 year 3 months, BCI test showing no improvement for extended therapy - Mammogram on 04/18/2021 was BI-RADS Category 1, negative  - Physical exam today shows normal lumpectomy scar around the nipple with no palpable masses or lymph nodes.  -No red flag symptoms of recurrent cancer at this time - Reviewed labs from 05/02/2021 which were grossly within normal limits.   - PLAN: Follow-up in 1 year with repeat labs, mammogram, and office visit. - Discussed with patient that she could follow with her PCP for ongoing breast exams and mammograms, but she reports that she would like to continue to follow with oncology office at this time.   2.  Osteopenia: - DEXA scan on 04/12/2020 with T score -1.8. - DEXA scan on 09/14/2016 with T score of -1.8 in the left femoral neck. - DEXA scan on 08/31/2014 with T score of -1.8 in the left femoral neck. - She received 3 doses of Prolia, last dose on 09/29/2015. - She completed aromatase inhibitor therapy in 2017 - Most recent vitamin D was normal a year ago, vitamin D  today is pending. - PLAN: Continue calcium and vitamin D supplements.  Continue weightbearing exercises.  We will plan on repeat DEXA scan in November 2024.      3.  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   PLAN SUMMARY & DISPOSITION: Follow-up in 1 year with labs, mammogram, office visit.  All questions were answered. The patient knows to call the clinic with any problems, questions or concerns.  Medical decision making: Moderate  Time spent on visit: I spent 20 minutes counseling the patient face to face. The total time spent in the appointment was 30 minutes and more than 50% was on counseling.   Harriett Rush, PA-C  05/02/2021 5:12 PM

## 2021-05-02 ENCOUNTER — Inpatient Hospital Stay (HOSPITAL_COMMUNITY): Payer: BC Managed Care – PPO

## 2021-05-02 ENCOUNTER — Inpatient Hospital Stay (HOSPITAL_COMMUNITY): Payer: BC Managed Care – PPO | Attending: Hematology | Admitting: Physician Assistant

## 2021-05-02 ENCOUNTER — Other Ambulatory Visit: Payer: Self-pay

## 2021-05-02 VITALS — BP 124/84 | HR 66 | Temp 98.6°F | Resp 17 | Wt 174.6 lb

## 2021-05-02 DIAGNOSIS — Z170 Estrogen receptor positive status [ER+]: Secondary | ICD-10-CM | POA: Insufficient documentation

## 2021-05-02 DIAGNOSIS — R5383 Other fatigue: Secondary | ICD-10-CM

## 2021-05-02 DIAGNOSIS — M8580 Other specified disorders of bone density and structure, unspecified site: Secondary | ICD-10-CM | POA: Diagnosis not present

## 2021-05-02 DIAGNOSIS — C50911 Malignant neoplasm of unspecified site of right female breast: Secondary | ICD-10-CM | POA: Insufficient documentation

## 2021-05-02 DIAGNOSIS — Z79811 Long term (current) use of aromatase inhibitors: Secondary | ICD-10-CM | POA: Diagnosis not present

## 2021-05-02 LAB — CBC WITH DIFFERENTIAL/PLATELET
Abs Immature Granulocytes: 0.02 10*3/uL (ref 0.00–0.07)
Basophils Absolute: 0 10*3/uL (ref 0.0–0.1)
Basophils Relative: 0 %
Eosinophils Absolute: 0.1 10*3/uL (ref 0.0–0.5)
Eosinophils Relative: 1 %
HCT: 42.1 % (ref 36.0–46.0)
Hemoglobin: 14.5 g/dL (ref 12.0–15.0)
Immature Granulocytes: 0 %
Lymphocytes Relative: 48 %
Lymphs Abs: 3.9 10*3/uL (ref 0.7–4.0)
MCH: 35.3 pg — ABNORMAL HIGH (ref 26.0–34.0)
MCHC: 34.4 g/dL (ref 30.0–36.0)
MCV: 102.4 fL — ABNORMAL HIGH (ref 80.0–100.0)
Monocytes Absolute: 0.6 10*3/uL (ref 0.1–1.0)
Monocytes Relative: 7 %
Neutro Abs: 3.6 10*3/uL (ref 1.7–7.7)
Neutrophils Relative %: 44 %
Platelets: 265 10*3/uL (ref 150–400)
RBC: 4.11 MIL/uL (ref 3.87–5.11)
RDW: 12.4 % (ref 11.5–15.5)
WBC: 8.2 10*3/uL (ref 4.0–10.5)
nRBC: 0 % (ref 0.0–0.2)

## 2021-05-02 LAB — COMPREHENSIVE METABOLIC PANEL
ALT: 29 U/L (ref 0–44)
AST: 29 U/L (ref 15–41)
Albumin: 4.3 g/dL (ref 3.5–5.0)
Alkaline Phosphatase: 46 U/L (ref 38–126)
Anion gap: 9 (ref 5–15)
BUN: 24 mg/dL — ABNORMAL HIGH (ref 6–20)
CO2: 28 mmol/L (ref 22–32)
Calcium: 9.7 mg/dL (ref 8.9–10.3)
Chloride: 97 mmol/L — ABNORMAL LOW (ref 98–111)
Creatinine, Ser: 1.22 mg/dL — ABNORMAL HIGH (ref 0.44–1.00)
GFR, Estimated: 51 mL/min — ABNORMAL LOW (ref >60)
Glucose, Bld: 80 mg/dL (ref 70–99)
Potassium: 3.4 mmol/L — ABNORMAL LOW (ref 3.5–5.1)
Sodium: 134 mmol/L — ABNORMAL LOW (ref 135–145)
Total Bilirubin: 0.6 mg/dL (ref 0.3–1.2)
Total Protein: 8.3 g/dL — ABNORMAL HIGH (ref 6.5–8.1)

## 2021-05-02 LAB — VITAMIN D 25 HYDROXY (VIT D DEFICIENCY, FRACTURES): Vit D, 25-Hydroxy: 40.3 ng/mL (ref 30–100)

## 2021-05-02 NOTE — Patient Instructions (Signed)
[facility] at [facility] Discharge Instructions  You were seen today by Maxamus Colao PA-C for your history of right-sided breast cancer.  Your most recent mammogram did not show any signs of returning cancer, and there were no major abnormalities in your labs or physical exam today.  We will repeat your mammogram in November 2023 and we will see you in 1 year for follow-up visit.  Continue to take calcium and vitamin D supplements for your bone health.  I also recommend continued weightbearing exercises to strengthen your bones.    Thank you for choosing [facility] at [facility] to provide your oncology and hematology care.  To afford each patient quality time with our provider, please arrive at least 15 minutes before your scheduled appointment time.   If you have a lab appointment with the Cancer Center please come in thru the Main Entrance and check in at the main information desk.  You need to re-schedule your appointment should you arrive 10 or more minutes late.  We strive to give you quality time with our providers, and arriving late affects you and other patients whose appointments are after yours.  Also, if you no show three or more times for appointments you may be dismissed from the clinic at the providers discretion.     Again, thank you for choosing [facility].  Our hope is that these requests will decrease the amount of time that you wait before being seen by our physicians.       _____________________________________________________________  Should you have questions after your visit to  Cancer Center, please contact our office at [phone] and follow the prompts.  Our office hours are 8:00 a.m. and 4:30 p.m. Monday - Friday.  Please note that voicemails left after 4:00 p.m. may not be returned until the following business day.  We are closed weekends and major holidays.  You do have access to a nurse  24-7, just call the main number to the clinic [phone] and do not press any options, hold on the line and a nurse will answer the phone.    For prescription refill requests, have your pharmacy contact our office and allow 72 hours.    Due to Covid, you will need to wear a mask upon entering the hospital. If you do not have a mask, a mask will be given to you at the Main Entrance upon arrival. For doctor visits, patients may have 1 support person age 18 or older with them. For treatment visits, patients can not have anyone with them due to social distancing guidelines and our immunocompromised population.    

## 2021-07-07 DIAGNOSIS — U071 COVID-19: Secondary | ICD-10-CM | POA: Diagnosis not present

## 2021-07-07 DIAGNOSIS — R059 Cough, unspecified: Secondary | ICD-10-CM | POA: Diagnosis not present

## 2021-08-17 DIAGNOSIS — I10 Essential (primary) hypertension: Secondary | ICD-10-CM | POA: Diagnosis not present

## 2021-08-17 DIAGNOSIS — E782 Mixed hyperlipidemia: Secondary | ICD-10-CM | POA: Diagnosis not present

## 2021-08-17 DIAGNOSIS — R7301 Impaired fasting glucose: Secondary | ICD-10-CM | POA: Diagnosis not present

## 2021-08-17 DIAGNOSIS — Z79899 Other long term (current) drug therapy: Secondary | ICD-10-CM | POA: Diagnosis not present

## 2021-08-24 DIAGNOSIS — Z0001 Encounter for general adult medical examination with abnormal findings: Secondary | ICD-10-CM | POA: Diagnosis not present

## 2021-08-25 ENCOUNTER — Other Ambulatory Visit (HOSPITAL_COMMUNITY): Payer: Self-pay | Admitting: Family Medicine

## 2021-08-25 DIAGNOSIS — M8580 Other specified disorders of bone density and structure, unspecified site: Secondary | ICD-10-CM

## 2021-12-14 DIAGNOSIS — M25562 Pain in left knee: Secondary | ICD-10-CM | POA: Diagnosis not present

## 2022-01-13 DIAGNOSIS — B351 Tinea unguium: Secondary | ICD-10-CM | POA: Diagnosis not present

## 2022-01-13 DIAGNOSIS — M79675 Pain in left toe(s): Secondary | ICD-10-CM | POA: Diagnosis not present

## 2022-02-10 DIAGNOSIS — B351 Tinea unguium: Secondary | ICD-10-CM | POA: Diagnosis not present

## 2022-02-10 DIAGNOSIS — M79675 Pain in left toe(s): Secondary | ICD-10-CM | POA: Diagnosis not present

## 2022-02-17 DIAGNOSIS — E782 Mixed hyperlipidemia: Secondary | ICD-10-CM | POA: Diagnosis not present

## 2022-02-17 DIAGNOSIS — I10 Essential (primary) hypertension: Secondary | ICD-10-CM | POA: Diagnosis not present

## 2022-02-17 DIAGNOSIS — E1165 Type 2 diabetes mellitus with hyperglycemia: Secondary | ICD-10-CM | POA: Diagnosis not present

## 2022-02-25 DIAGNOSIS — E782 Mixed hyperlipidemia: Secondary | ICD-10-CM | POA: Diagnosis not present

## 2022-02-25 DIAGNOSIS — E1165 Type 2 diabetes mellitus with hyperglycemia: Secondary | ICD-10-CM | POA: Diagnosis not present

## 2022-02-25 DIAGNOSIS — D539 Nutritional anemia, unspecified: Secondary | ICD-10-CM | POA: Diagnosis not present

## 2022-02-25 DIAGNOSIS — I10 Essential (primary) hypertension: Secondary | ICD-10-CM | POA: Diagnosis not present

## 2022-04-21 ENCOUNTER — Ambulatory Visit (HOSPITAL_COMMUNITY): Payer: BC Managed Care – PPO

## 2022-04-24 ENCOUNTER — Ambulatory Visit (HOSPITAL_COMMUNITY)
Admission: RE | Admit: 2022-04-24 | Discharge: 2022-04-24 | Disposition: A | Discharge status: Home / Self Care | Payer: BC Managed Care – PPO | Source: Ambulatory Visit | Attending: Physician Assistant | Admitting: Physician Assistant

## 2022-04-24 ENCOUNTER — Inpatient Hospital Stay: Payer: BC Managed Care – PPO | Attending: Physician Assistant

## 2022-04-24 DIAGNOSIS — Z1231 Encounter for screening mammogram for malignant neoplasm of breast: Secondary | ICD-10-CM | POA: Insufficient documentation

## 2022-04-24 DIAGNOSIS — C50911 Malignant neoplasm of unspecified site of right female breast: Secondary | ICD-10-CM

## 2022-04-24 DIAGNOSIS — Z853 Personal history of malignant neoplasm of breast: Secondary | ICD-10-CM | POA: Insufficient documentation

## 2022-04-24 LAB — CBC WITH DIFFERENTIAL/PLATELET
Abs Immature Granulocytes: 0.01 10*3/uL (ref 0.00–0.07)
Basophils Absolute: 0 10*3/uL (ref 0.0–0.1)
Basophils Relative: 0 %
Eosinophils Absolute: 0.1 10*3/uL (ref 0.0–0.5)
Eosinophils Relative: 2 %
HCT: 41.6 % (ref 36.0–46.0)
Hemoglobin: 14.5 g/dL (ref 12.0–15.0)
Immature Granulocytes: 0 %
Lymphocytes Relative: 47 %
Lymphs Abs: 3.6 10*3/uL (ref 0.7–4.0)
MCH: 35.1 pg — ABNORMAL HIGH (ref 26.0–34.0)
MCHC: 34.9 g/dL (ref 30.0–36.0)
MCV: 100.7 fL — ABNORMAL HIGH (ref 80.0–100.0)
Monocytes Absolute: 0.6 10*3/uL (ref 0.1–1.0)
Monocytes Relative: 7 %
Neutro Abs: 3.3 10*3/uL (ref 1.7–7.7)
Neutrophils Relative %: 44 %
Platelets: 277 10*3/uL (ref 150–400)
RBC: 4.13 MIL/uL (ref 3.87–5.11)
RDW: 12.4 % (ref 11.5–15.5)
WBC: 7.6 10*3/uL (ref 4.0–10.5)
nRBC: 0 % (ref 0.0–0.2)

## 2022-04-24 LAB — COMPREHENSIVE METABOLIC PANEL
ALT: 29 U/L (ref 0–44)
AST: 28 U/L (ref 15–41)
Albumin: 4.3 g/dL (ref 3.5–5.0)
Alkaline Phosphatase: 44 U/L (ref 38–126)
Anion gap: 8 (ref 5–15)
BUN: 21 mg/dL (ref 8–23)
CO2: 25 mmol/L (ref 22–32)
Calcium: 9.6 mg/dL (ref 8.9–10.3)
Chloride: 103 mmol/L (ref 98–111)
Creatinine, Ser: 0.77 mg/dL (ref 0.44–1.00)
GFR, Estimated: >60 mL/min (ref >60)
Glucose, Bld: 121 mg/dL — ABNORMAL HIGH (ref 70–99)
Potassium: 3.6 mmol/L (ref 3.5–5.1)
Sodium: 136 mmol/L (ref 135–145)
Total Bilirubin: 0.6 mg/dL (ref 0.3–1.2)
Total Protein: 7.7 g/dL (ref 6.5–8.1)

## 2022-04-25 LAB — VITAMIN D 25 HYDROXY (VIT D DEFICIENCY, FRACTURES): Vit D, 25-Hydroxy: 41.79 ng/mL (ref 30–100)

## 2022-04-26 ENCOUNTER — Ambulatory Visit (HOSPITAL_COMMUNITY)
Admission: RE | Admit: 2022-04-26 | Discharge: 2022-04-26 | Disposition: A | Discharge status: Home / Self Care | Payer: BC Managed Care – PPO | Source: Ambulatory Visit | Attending: Family Medicine | Admitting: Family Medicine

## 2022-04-26 DIAGNOSIS — M8589 Other specified disorders of bone density and structure, multiple sites: Secondary | ICD-10-CM | POA: Diagnosis not present

## 2022-04-26 DIAGNOSIS — M8580 Other specified disorders of bone density and structure, unspecified site: Secondary | ICD-10-CM | POA: Diagnosis not present

## 2022-04-26 DIAGNOSIS — Z780 Asymptomatic menopausal state: Secondary | ICD-10-CM | POA: Diagnosis not present

## 2022-04-30 NOTE — Progress Notes (Unsigned)
[place], 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 Pablo Ledger at [facility]   07/05/2010 - 08/18/2014 Chemotherapy   Tamoxifen 20 mg daily   08/19/2014 -  Anti-estrogen oral therapy   Aromasin daily   09/29/2014 Survivorship   Prolia started for osteopenia in the setting of Aromatase inhibitor.   10/08/2014 Pathology Results   BCI- low risk of late recurrence (2.7% btweenyrs 5-10), high likelihood of benefit from extended endocrine therapy, high relative risk reduction (16.5% absolute benefit), low risk of overall recurrence (4.8% btween yrs 0-10), and high benefit from EET.     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, a 61 y.o. female, returns for routine follow-up of her stage IIa right-sided breast cancer. Vanessa Neal was last seen on 05/02/2021 by Tarri Abernethy PA-C.  At today's visit, she reports feeling well.  She denies any recent hospitalizations, surgeries, or changes in her baseline health status.  Most recent mammogram on 04/24/2022 showed no evidence of malignancy.  She denies any symptoms of recurrence such  as new lumps, bone pain, chest pain, dyspnea, or abdominal pain.   She has no new headaches, seizures, or focal neurologic deficits.  No B symptoms such as fever, chills, night sweats, unintentional weight loss.  She reports 100% energy and 100% appetite.  She is maintaining stable weight at this time, but is working on intentional weight loss through healthy lifestyle modifications.   REVIEW OF SYSTEMS:  Review of Systems  Constitutional:  Negative for appetite change, chills, diaphoresis, fatigue, fever and unexpected weight change.  HENT:   Negative for lump/mass and nosebleeds.   Eyes:  Negative for eye problems.  Respiratory:  Negative for cough, hemoptysis and shortness of breath.   Cardiovascular:  Negative for chest pain, leg swelling and palpitations.  Gastrointestinal:  Positive for diarrhea (chronic). Negative for abdominal pain, blood in stool, constipation, nausea and vomiting.  Genitourinary:  Negative for hematuria.   Skin: Negative.   Neurological:  Negative for dizziness, headaches and light-headedness.  Hematological:  Does not bruise/bleed easily.    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 ([place]) 01/04/2011   Ovarian cyst    Personal history of chemotherapy    Personal history of radiation therapy    Right thyroid nodule    Past Surgical History:  Procedure Laterality Date   ABDOMINAL HYSTERECTOMY N/A 06/16/2014   Procedure: HYSTERECTOMY ABDOMINAL;  Surgeon: Jonnie Kind, MD;  Location: [facility] ORS;  Service: Gynecology;  Laterality: N/A;   BIOPSY  11/07/2019   Procedure: BIOPSY;  Surgeon: Daneil Dolin, MD;  Location: [facility] ENDO SUITE;  Service: Endoscopy;;   BREAST CYST ASPIRATION  01/2012   BREAST CYST EXCISION     BREAST LUMPECTOMY     right   CESAREAN  SECTION     X 2   CHOLECYSTECTOMY     COLONOSCOPY  03/11/2012   Procedure: COLONOSCOPY;  Surgeon: Danie Binder, MD;  Location: [facility];  Service: Endoscopy;  Laterality: N/A;  9:30 AM-changed to 9:50 Doris notified   COLONOSCOPY N/A 11/07/2019   Normal colon and TI. Non-bleeding internal hemorrhoids. S/p biopsy. Lymphocytic colitis. Prescribed budesonide.    LIPOMA EXCISION     from back   SALPINGOOPHORECTOMY Bilateral 06/16/2014   Procedure: BILATERAL SALPINGO OOPHORECTOMY;  Surgeon: Jonnie Kind, MD;  Location: [facility] OR[facility];  Service: Gynecology;  Laterality: Bilateral;   SCAR REVISION N/A 06/16/2014   Procedure: SCAR REVISION;  Surgeon: Jonnie Kind, MD;  Location: [facility] OR[facility];  Service: Gynecology;  Laterality: N/A;    SOCIAL HISTORY:  Social History   Socioeconomic History   Marital status: Married    Spouse name: Not on file   Number of children: Not on file   Years of education: Not on file   Highest education level: Not on file  Occupational History   Not on file  Tobacco Use   Smoking status: Never   Smokeless tobacco: Never  Substance and Sexual Activity   Alcohol use: No   Drug use: No   Sexual activity: Yes    Birth control/protection: Other-see comments, Surgical    Comment: Pt had chemo and no longer has a period.  Other Topics Concern   Not on file  Social History Narrative   Not on file   Social Determinants of Health   Financial Resource Strain: Not on file  Food Insecurity: Not on file  Transportation Needs: Not on file  Physical Activity: Not on file  Stress: Not on file  Social Connections: Not on file  Intimate Partner Violence: Not on file    FAMILY HISTORY:  Family History  Problem Relation Age of Onset   Cancer Mother        breast   Kidney failure Father    Cancer Maternal Grandmother        colon   Colon cancer Maternal Grandmother    Cancer Paternal Grandmother        ovarian   Cancer Sister        melanoma    CURRENT  MEDICATIONS:  Current Outpatient Medications  Medication Sig Dispense Refill   Calcium Carb-Cholecalciferol (CALCIUM 600 + D PO) Take 1 tablet by mouth in the morning and at bedtime.      cetirizine (ZYRTEC) 10 MG tablet Take 10 mg by mouth daily. Takes Mon, Wed, and Fri     diphenhydramine-acetaminophen (TYLENOL PM) 25-500 MG TABS tablet Take 1 tablet by mouth as needed (sleep).      FARXIGA 5 MG TABS tablet Take 5 mg by mouth daily.     hydrochlorothiazide (HYDRODIURIL) 12.5 MG tablet Take 12.5 mg by mouth daily.     Multiple Vitamins-Calcium (ONE-A-DAY WOMENS PO) Take 1 tablet by mouth daily.      pravastatin (PRAVACHOL) 10 MG tablet Take 10 mg by mouth daily.     Probiotic Product (ALIGN PO) Take 1 capsule by  mouth daily.      venlafaxine (EFFEXOR) 37.5 MG tablet TAKE (1) TABLET BY MOUTH THREE TIMES DAILY WITH FOOD. (Patient taking differently: Take 37.5 mg by mouth daily.) 90 tablet 5   No current facility-administered medications for this visit.    ALLERGIES:  Allergies  Allergen Reactions   Banana Nausea Only   Kiwi Extract Nausea Only    PHYSICAL EXAM:  Performance status (ECOG): 0 - Asymptomatic  There were no vitals filed for this visit. Wt Readings from Last 3 Encounters:  05/02/21 174 lb 9.6 oz (79.2 kg)  04/14/20 170 lb 12.8 oz (77.5 kg)  04/06/20 171 lb 3.2 oz (77.7 kg)   Physical Exam Constitutional:      Appearance: Normal appearance.  HENT:     Head: Normocephalic and atraumatic.     Mouth/Throat:     Mouth: Mucous membranes are moist.  Eyes:     Extraocular Movements: Extraocular movements intact.     Pupils: Pupils are equal, round, and reactive to light.  Cardiovascular:     Rate and Rhythm: Normal rate and regular rhythm.     Pulses: Normal pulses.     Heart sounds: Normal heart sounds.  Pulmonary:     Effort: Pulmonary effort is normal.     Breath sounds: Normal breath sounds.  Chest:    Abdominal:     General: Bowel sounds are normal.      Palpations: Abdomen is soft.     Tenderness: There is no abdominal tenderness.  Musculoskeletal:        General: No swelling.     Right lower leg: No edema.     Left lower leg: No edema.  Lymphadenopathy:     Cervical: No cervical adenopathy.  Skin:    General: Skin is warm and dry.  Neurological:     General: No focal deficit present.     Mental Status: She is alert and oriented to person, place, and time.  Psychiatric:        Mood and Affect: Mood normal.        Behavior: Behavior normal.      LABORATORY DATA:  I have reviewed the labs as listed.     Latest Ref Rng & Units 04/24/2022    3:55 PM 05/02/2021    1:39 PM 04/09/2020    8:15 AM  CBC  WBC 4.0 - 10.5 K/uL 7.6  8.2  6.3   Hemoglobin 12.0 - 15.0 g/dL 14.5  14.5  14.5   Hematocrit 36.0 - 46.0 % 41.6  42.1  41.5   Platelets 150 - 400 K/uL 277  265  294       Latest Ref Rng & Units 04/24/2022    3:55 PM 05/02/2021    1:39 PM 04/09/2020    8:15 AM  CMP  Glucose 70 - 99 mg/dL 121  80  100   BUN 8 - 23 mg/dL _0 Creatinine 0.44 - 1.00 mg/dL 0.77  1.22  0.93   Sodium 135 - 145 mmol/L 136  134  135   Potassium 3.5 - 5.1 mmol/L 3.6  3.4  4.1   Chloride 98 - 111 mmol/L 103  97  99   CO2 22 - 32 mmol/L _1 Calcium 8.9 - 10.3 mg/dL 9.6  9.7  9.4   Total Protein 6.5 - 8.1 g/dL 7.7  8.3  7.8   Total Bilirubin 0.3 - 1.2 mg/dL 0.6  0.6  0.9   Alkaline Phos 38 - 126 U/L 44  46  38   AST 15 - 41 U/L _0 ALT 0 - 44 U/L _1 DIAGNOSTIC IMAGING:  I have independently reviewed the scans and discussed with the patient. DG BONE DENSITY (DXA)  Result Date: 04/26/2022 EXAM: DUAL X-RAY ABSORPTIOMETRY (DXA) FOR BONE MINERAL DENSITY IMPRESSION: Your patient Vanessa Neal completed a BMD test on 04/26/2022 using the [place] (software version: 14.10) manufactured by [employer]. The following summarizes the results of our evaluation. Technologist: [facility] PATIENT  BIOGRAPHICAL: Name: Vanessa Neal, Vanessa Neal Patient ID: [id number] Birth Date: 1961-01-24 Height: 64.0 in. Gender: Female Exam Date: 04/26/2022 Weight: 174.6 lbs. Indications: Bilateral Oophrectomy, Caucasian, Early Menopause, Follow up Osteopenia, Height Loss, Hx Breast Ca, Post Menopausal Fractures: Treatments: Vitamin D, Multivitamin, Calcium DENSITOMETRY RESULTS: Site      Region     Measured Date Measured Age WHO Classification Young Adult T-score BMD         %Change vs. Previous Significant Change (*) AP Spine L1-L2 04/26/2022 61.4 Osteopenia -1.2 1.018 g/cm2 1.0% - AP Spine L1-L2 04/12/2020 59.4 Osteopenia -1.3 1.008 g/cm2 -2.0% - AP Spine L1-L2 09/14/2016 55.8 Osteopenia -1.1 1.029 g/cm2 2.4% - AP Spine L1-L2 08/31/2014 53.8 Osteopenia -1.3 1.005 g/cm2 - - DualFemur Neck Left 04/26/2022 61.4 Osteopenia -1.7 0.802 g/cm2 2.3% - DualFemur Neck Left 04/12/2020 59.4 Osteopenia -1.8 0.784 g/cm2 -1.3% - DualFemur Neck Left 09/14/2016 55.8 Osteopenia -1.8 0.794 g/cm2 1.5% - DualFemur Neck Left 08/31/2014 53.8 Osteopenia -1.8 0.782 g/cm2 - - DualFemur Total Mean 04/26/2022 61.4 Osteopenia -1.1 0.875 g/cm2 -1.0% - DualFemur Total Mean 04/12/2020 59.4 Normal -1.0 0.884 g/cm2 5.5% Yes DualFemur Total Mean 09/14/2016 55.8 Osteopenia -1.3 0.838 g/cm2 -2.1% - DualFemur Total Mean 08/31/2014 53.8 Osteopenia -1.2 0.856 g/cm2 - - ASSESSMENT: The BMD measured at Femur Neck Left is 0.802 g/cm2 with a T-score of -1.7. This patient is considered osteopenic according to [facility]) criteria. The scan quality is good. Compared with the prior study on 04/12/20, the BMD of the total mean shows no statistically significant change. L3 and L4 were excluded due to advanced degenerative changes. World Pharmacologist [facility]) criteria for post-menopausal, Caucasian Women: Normal:       T-score at or above -1 SD Osteopenia:   T-score between -1 and -2.5 SD Osteoporosis: T-score at or below -2.5 SD RECOMMENDATIONS: 1. All  patients should optimize calcium and vitamin D intake. 2. Consider FDA-approved medical therapies in postmenopausal women and med aged 38 years and older, based on the following: a. A hip or vertebral (clinical or morphometric) fracture b. T-score< -2.5 at the femoral neck or spine after appropriate evaluation to exclude secondary causes c. Low bone mass (T-score between -1.0 and -2.5 at the femoral neck or spine) and a 10-year probability of a hip fracture > 3% or a 10-year probability of a major osteoporosis-related fracture > 20% based on the [place]-adapted WHO algorithm d. Clinician judgment and/or patient preferences may indicate treatment for people with 10-year fracture probabilities above or below these levels FOLLOW-UP: Patients with diagnosis of osteoporsis or at high risk for fracture should have regular bone mineral density tests. For patients eligible for Medicare, routine testing is allowed once every 2 years. The testing frequency can be increased to one year for patients who have rapidly progressing disease, those who are receiving or discontinuing medical therapy to  restore bone mass, or have additional risk factors. I have reviewed this report, and agree with the above findings. [facility] Your patient Vanessa Neal completed a FRAX assessment on 04/26/2022 using the [place] (analysis version: 14.10) manufactured by [employer]. The following summarizes the results of our evaluation. PATIENT BIOGRAPHICAL: Name: Vanessa Neal, Vanessa Neal Patient ID: [id number] Birth Date: [birth year] Height:    64.0 in. Gender:     Female    [age]       Weight:    174.6 lbs. Ethnicity:  White                            Exam Date: 04/26/2022 FRAX* RESULTS:  (version: 3.5) 10-year Probability of Fracture1 Major Osteoporotic Fracture2 Hip Fracture 8.7% 0.9% Population: [place] (Caucasian) Risk Factors: None Based on Femur (Left) Neck BMD 1 -The 10-year probability of fracture may be lower  than reported if the patient has received treatment. 2 -Major Osteoporotic Fracture: Clinical Spine, Forearm, Hip or Shoulder *FRAX is a Materials engineer of the [employer] for Metabolic Bone Disease, a Gate (WHO) [facility]. ASSESSMENT: The probability of a major osteoporotic fracture is 8.7% within the next ten years. The probability of a hip fracture is 0.9% within the next ten years. Electronically Signed   By: Zerita Boers M.D.   On: 04/26/2022 08:56   MM 3D SCREEN BREAST BILATERAL  Result Date: 04/25/2022 CLINICAL DATA:  Screening. EXAM: DIGITAL SCREENING BILATERAL MAMMOGRAM WITH TOMOSYNTHESIS AND CAD TECHNIQUE: Bilateral screening digital craniocaudal and mediolateral oblique mammograms were obtained. Bilateral screening digital breast tomosynthesis was performed. The images were evaluated with computer-aided detection. COMPARISON:  Previous exam(s). ACR Breast Density Category c: The breast tissue is heterogeneously dense, which may obscure small masses. FINDINGS: There are no findings suspicious for malignancy. IMPRESSION: No mammographic evidence of malignancy. A result letter of this screening mammogram will be mailed directly to the patient. RECOMMENDATION: Screening mammogram in one year. (Code:SM-B-01Y) BI-RADS CATEGORY  1: Negative. Electronically Signed   By: Abelardo Diesel M.D.   On: 04/25/2022 13:53      ASSESSMENT & PLAN: 1.  Right breast cancer, stage IIa: - Status post neoadjuvant chemotherapy with epirubicin, Cytoxan from 10/19/2009 through 12/27/2009 followed by 12 weekly doses of Taxol completed on 03/28/2010 - Right lumpectomy on 04/13/2010, ER/PR positive, HER-2 negative, 1.1 cm IDC, grade 2, 3 out of 3 lymph nodes negative, pT1c,pN0, ER/PR positive and HER-2 negative, Ki-67 of 85% - Radiation therapy to the right breast completed on 07/04/2010 -Tamoxifen from 07/05/2010 through 08/18/2014 followed by Aromasin for 1 year 3  months, BCI test showing no improvement for extended therapy - Mammogram on 04/24/2022 was BI-RADS Category 1, negative  - Physical exam today shows normal lumpectomy scar around the nipple with no palpable masses or lymph nodes. - No red flag symptoms of recurrent cancer at this time - Reviewed labs from 04/24/2022 which were grossly within normal limits.   - PLAN: Follow-up in 1 year with repeat labs, mammogram, and office visit. - Discussed with patient that she could follow with her PCP for ongoing breast exams and mammograms, but she reports that she would like to continue to follow with oncology office at this time.   2.  Osteopenia: - DEXA scan on 04/12/2020 with T score -1.8. - DEXA scan on 09/14/2016 with T score of -  1.8 in the left femoral neck. - DEXA scan on 08/31/2014 with T score of -1.8 in the left femoral neck. - Most recent DEXA scan (04/26/2022): T-score -1.7, osteopenia - She received 3 doses of Prolia, last dose on 09/29/2015. - She completed aromatase inhibitor therapy in 2017 - Most recent vitamin D (04/24/2022) normal at 41.79 - PLAN: Continue calcium and vitamin D supplements.  Continue weightbearing exercises.   3.  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   PLAN SUMMARY: >> Screening mammogram in 1 year >> Labs in 1 year (CBC/D, CMP, vitamin D) >> Office visit with physical exam in 1 year, 1 week after labs/mammogram   All questions were answered. The patient knows to call the clinic with any problems, questions or concerns.  Medical decision making:Low  Time spent on visit: I spent 15 minutes counseling the patient face to face. The total time spent in the appointment was 22 minutes and more than 50% was on counseling.   Harriett Rush, PA-C  05/01/22 3:11 PM

## 2022-05-01 ENCOUNTER — Inpatient Hospital Stay: Payer: BC Managed Care – PPO | Attending: Physician Assistant | Admitting: Physician Assistant

## 2022-05-01 ENCOUNTER — Other Ambulatory Visit: Payer: Self-pay

## 2022-05-01 VITALS — BP 128/81 | HR 81 | Temp 98.6°F | Resp 18 | Ht 63.0 in | Wt 179.9 lb

## 2022-05-01 DIAGNOSIS — Z853 Personal history of malignant neoplasm of breast: Secondary | ICD-10-CM | POA: Diagnosis not present

## 2022-05-01 DIAGNOSIS — M8580 Other specified disorders of bone density and structure, unspecified site: Secondary | ICD-10-CM | POA: Diagnosis not present

## 2022-05-01 DIAGNOSIS — C50911 Malignant neoplasm of unspecified site of right female breast: Secondary | ICD-10-CM | POA: Diagnosis not present

## 2022-05-01 DIAGNOSIS — R5383 Other fatigue: Secondary | ICD-10-CM

## 2022-05-01 DIAGNOSIS — Z08 Encounter for follow-up examination after completed treatment for malignant neoplasm: Secondary | ICD-10-CM | POA: Insufficient documentation

## 2022-05-01 DIAGNOSIS — Z1231 Encounter for screening mammogram for malignant neoplasm of breast: Secondary | ICD-10-CM

## 2022-05-01 NOTE — Patient Instructions (Signed)
Vanessa Neal at [facility] Discharge Instructions  You were seen today by Tarri Abernethy PA-C for your history of right-sided breast cancer.  Your most recent mammogram did not show any signs of returning cancer, and there were no major abnormalities in your labs or physical exam today.  We will repeat your mammogram in November 2023 and we will see you in 1 year for follow-up visit.  Continue to take calcium and vitamin D supplements for your bone health.  I also recommend continued weightbearing exercises to strengthen your bones.    Thank you for choosing [facility] at [facility] to provide your oncology and hematology care.  To afford each patient quality time with our provider, please arrive at least 15 minutes before your scheduled appointment time.   If you have a lab appointment with the [facility] please come in thru the Main Entrance and check in at the main information desk.  You need to re-schedule your appointment should you arrive 10 or more minutes late.  We strive to give you quality time with our providers, and arriving late affects you and other patients whose appointments are after yours.  Also, if you no show three or more times for appointments you may be dismissed from the clinic at the providers discretion.     Again, thank you for choosing [facility].  Our hope is that these requests will decrease the amount of time that you wait before being seen by our physicians.       _____________________________________________________________  Should you have questions after your visit to [facility], please contact our office at [phone] and follow the prompts.  Our office hours are 8:00 a.m. and 4:30 p.m. Monday - Friday.  Please note that voicemails left after 4:00 p.m. may not be returned until the following business day.  We are closed weekends and major holidays.  You do have access to a nurse  24-7, just call the main number to the clinic [phone] and do not press any options, hold on the line and a nurse will answer the phone.    For prescription refill requests, have your pharmacy contact our office and allow 72 hours.    Due to Covid, you will need to wear a mask upon entering the hospital. If you do not have a mask, a mask will be given to you at the Main Entrance upon arrival. For doctor visits, patients may have 1 support person age 56 or older with them. For treatment visits, patients can not have anyone with them due to social distancing guidelines and our immunocompromised population.

## 2022-06-17 DIAGNOSIS — M545 Low back pain, unspecified: Secondary | ICD-10-CM | POA: Diagnosis not present

## 2022-07-24 DIAGNOSIS — R059 Cough, unspecified: Secondary | ICD-10-CM | POA: Diagnosis not present

## 2022-07-24 DIAGNOSIS — U071 COVID-19: Secondary | ICD-10-CM | POA: Diagnosis not present

## 2022-08-23 DIAGNOSIS — I10 Essential (primary) hypertension: Secondary | ICD-10-CM | POA: Diagnosis not present

## 2022-08-23 DIAGNOSIS — E782 Mixed hyperlipidemia: Secondary | ICD-10-CM | POA: Diagnosis not present

## 2022-08-23 DIAGNOSIS — E1165 Type 2 diabetes mellitus with hyperglycemia: Secondary | ICD-10-CM | POA: Diagnosis not present

## 2022-08-30 DIAGNOSIS — I10 Essential (primary) hypertension: Secondary | ICD-10-CM | POA: Diagnosis not present

## 2022-08-30 DIAGNOSIS — D539 Nutritional anemia, unspecified: Secondary | ICD-10-CM | POA: Diagnosis not present

## 2022-08-30 DIAGNOSIS — E782 Mixed hyperlipidemia: Secondary | ICD-10-CM | POA: Diagnosis not present

## 2022-08-30 DIAGNOSIS — E1165 Type 2 diabetes mellitus with hyperglycemia: Secondary | ICD-10-CM | POA: Diagnosis not present

## 2022-08-30 DIAGNOSIS — E785 Hyperlipidemia, unspecified: Secondary | ICD-10-CM | POA: Diagnosis not present

## 2022-08-30 DIAGNOSIS — J069 Acute upper respiratory infection, unspecified: Secondary | ICD-10-CM | POA: Diagnosis not present

## 2022-12-22 DIAGNOSIS — M79675 Pain in left toe(s): Secondary | ICD-10-CM | POA: Diagnosis not present

## 2022-12-22 DIAGNOSIS — L600 Ingrowing nail: Secondary | ICD-10-CM | POA: Diagnosis not present

## 2023-01-12 DIAGNOSIS — B351 Tinea unguium: Secondary | ICD-10-CM | POA: Diagnosis not present

## 2023-01-12 DIAGNOSIS — M79674 Pain in right toe(s): Secondary | ICD-10-CM | POA: Diagnosis not present

## 2023-02-06 ENCOUNTER — Telehealth: Payer: Self-pay | Admitting: Physician Assistant

## 2023-02-06 NOTE — Telephone Encounter (Signed)
Spoke with pt regarding MAMMOGRAM denial/coding and her upcoming mammo. She stated her 03-08-2022 mammo was denied by ins but could not remember why. I reviewed orders for 2022-2024. Mar 08, 2021 was coded with dx Z12.31 2022-03-08 was coded with dx C50... and the upcoming mammo has been coded with both C50 and Z12.31. Advised her that I would try to find out if the ICD 10 codes made a difference in payment.

## 2023-04-02 DIAGNOSIS — I10 Essential (primary) hypertension: Secondary | ICD-10-CM | POA: Diagnosis not present

## 2023-04-02 DIAGNOSIS — E559 Vitamin D deficiency, unspecified: Secondary | ICD-10-CM | POA: Diagnosis not present

## 2023-04-02 DIAGNOSIS — M2570 Osteophyte, unspecified joint: Secondary | ICD-10-CM | POA: Diagnosis not present

## 2023-04-02 DIAGNOSIS — E782 Mixed hyperlipidemia: Secondary | ICD-10-CM | POA: Diagnosis not present

## 2023-04-02 DIAGNOSIS — E1165 Type 2 diabetes mellitus with hyperglycemia: Secondary | ICD-10-CM | POA: Diagnosis not present

## 2023-04-05 DIAGNOSIS — Z0001 Encounter for general adult medical examination with abnormal findings: Secondary | ICD-10-CM | POA: Diagnosis not present

## 2023-04-05 DIAGNOSIS — E782 Mixed hyperlipidemia: Secondary | ICD-10-CM | POA: Diagnosis not present

## 2023-04-05 DIAGNOSIS — I10 Essential (primary) hypertension: Secondary | ICD-10-CM | POA: Diagnosis not present

## 2023-04-05 DIAGNOSIS — E1165 Type 2 diabetes mellitus with hyperglycemia: Secondary | ICD-10-CM | POA: Diagnosis not present

## 2023-04-05 DIAGNOSIS — D539 Nutritional anemia, unspecified: Secondary | ICD-10-CM | POA: Diagnosis not present

## 2023-04-12 ENCOUNTER — Encounter (HOSPITAL_COMMUNITY): Payer: Self-pay | Admitting: Hematology & Oncology

## 2023-04-17 ENCOUNTER — Encounter: Payer: Self-pay | Admitting: *Deleted

## 2023-04-17 ENCOUNTER — Telehealth: Payer: Self-pay | Admitting: *Deleted

## 2023-04-17 NOTE — Telephone Encounter (Signed)
Patient called to cancel upcoming appointments in December, as she was offered option to follow up with PCP last year going forward and had declined at that time.  She now wants to follow up with PCP unless oncology services are needed in the future.  Rojelio Brenner, PAC made aware.  She will keep her current mammogram appointment scheduled for 04/30/23.

## 2023-04-30 ENCOUNTER — Other Ambulatory Visit: Payer: BC Managed Care – PPO

## 2023-04-30 ENCOUNTER — Encounter (HOSPITAL_COMMUNITY): Payer: Self-pay

## 2023-04-30 ENCOUNTER — Other Ambulatory Visit (HOSPITAL_COMMUNITY): Payer: Self-pay | Admitting: Family Medicine

## 2023-04-30 ENCOUNTER — Ambulatory Visit (HOSPITAL_COMMUNITY)
Admission: RE | Admit: 2023-04-30 | Discharge: 2023-04-30 | Disposition: A | Discharge status: Home / Self Care | Payer: BC Managed Care – PPO | Source: Ambulatory Visit | Attending: Physician Assistant | Admitting: Physician Assistant

## 2023-04-30 ENCOUNTER — Inpatient Hospital Stay: Payer: BC Managed Care – PPO

## 2023-04-30 DIAGNOSIS — C50911 Malignant neoplasm of unspecified site of right female breast: Secondary | ICD-10-CM | POA: Insufficient documentation

## 2023-04-30 DIAGNOSIS — Z1231 Encounter for screening mammogram for malignant neoplasm of breast: Secondary | ICD-10-CM

## 2023-05-03 ENCOUNTER — Ambulatory Visit: Payer: BC Managed Care – PPO | Admitting: Physician Assistant

## 2023-05-07 ENCOUNTER — Inpatient Hospital Stay: Payer: BC Managed Care – PPO | Admitting: Physician Assistant

## 2023-05-07 ENCOUNTER — Ambulatory Visit: Payer: BC Managed Care – PPO | Admitting: Physician Assistant

## 2023-05-17 DIAGNOSIS — C44519 Basal cell carcinoma of skin of other part of trunk: Secondary | ICD-10-CM | POA: Diagnosis not present

## 2023-05-17 DIAGNOSIS — Z1283 Encounter for screening for malignant neoplasm of skin: Secondary | ICD-10-CM | POA: Diagnosis not present

## 2023-05-17 DIAGNOSIS — D225 Melanocytic nevi of trunk: Secondary | ICD-10-CM | POA: Diagnosis not present

## 2023-05-17 DIAGNOSIS — L218 Other seborrheic dermatitis: Secondary | ICD-10-CM | POA: Diagnosis not present

## 2023-07-19 DIAGNOSIS — C44519 Basal cell carcinoma of skin of other part of trunk: Secondary | ICD-10-CM | POA: Diagnosis not present

## 2023-08-10 DIAGNOSIS — C44519 Basal cell carcinoma of skin of other part of trunk: Secondary | ICD-10-CM | POA: Diagnosis not present

## 2023-08-24 DIAGNOSIS — C44519 Basal cell carcinoma of skin of other part of trunk: Secondary | ICD-10-CM | POA: Diagnosis not present

## 2023-08-24 DIAGNOSIS — L821 Other seborrheic keratosis: Secondary | ICD-10-CM | POA: Diagnosis not present

## 2023-09-06 DIAGNOSIS — J029 Acute pharyngitis, unspecified: Secondary | ICD-10-CM | POA: Diagnosis not present

## 2023-09-24 DIAGNOSIS — H6503 Acute serous otitis media, bilateral: Secondary | ICD-10-CM | POA: Diagnosis not present

## 2023-09-24 DIAGNOSIS — R42 Dizziness and giddiness: Secondary | ICD-10-CM | POA: Diagnosis not present

## 2023-09-24 DIAGNOSIS — R112 Nausea with vomiting, unspecified: Secondary | ICD-10-CM | POA: Diagnosis not present

## 2023-09-30 ENCOUNTER — Ambulatory Visit: Admission: EM | Admit: 2023-09-30 | Discharge: 2023-09-30 | Disposition: A | Discharge status: Home / Self Care

## 2023-09-30 DIAGNOSIS — H65193 Other acute nonsuppurative otitis media, bilateral: Secondary | ICD-10-CM

## 2023-09-30 DIAGNOSIS — R590 Localized enlarged lymph nodes: Secondary | ICD-10-CM

## 2023-09-30 DIAGNOSIS — W57XXXA Bitten or stung by nonvenomous insect and other nonvenomous arthropods, initial encounter: Secondary | ICD-10-CM

## 2023-09-30 NOTE — Discharge Instructions (Signed)
 Continue the Astelin nasal spray twice daily, antihistamine daily and you may use a decongestant such as Sudafed as needed to help with the ear pressure.  Continue to monitor the swollen lymph nodes, you may apply heat and take ibuprofen  as needed.  Follow-up with primary care for a recheck

## 2023-09-30 NOTE — ED Triage Notes (Signed)
 Pt reports ear pain, sinus pressure and a knot behind the right ear. States last week she was seen at her primary and was told that she had fluid in her ears. Was given azstelin nasal spray, found a tick bite on the left and is unsure of the two are related.

## 2023-09-30 NOTE — ED Provider Notes (Addendum)
 [facility]    CSN: [id number] Arrival date & time: 09/30/23  1110      History   Chief Complaint No chief complaint on file.   HPI Vanessa Neal is a 63 y.o. female.   Patient presenting today following up on middle ear effusions diagnosed at primary care last week that were thought to be causing vertigo spells.  She was given Astelin nasal spray and states symptoms have improved but now noticing some swollen lymph nodes below the ears to bilateral neck.  She also pulled a tick off of the left side of her neck around this time and is unsure if this is related.  Denies fever, chills, body aches, fatigue, nausea, vomiting, rashes.  So far not trying anything other than the Astelin and antihistamines.    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) 01/04/2011   Ovarian cyst    Personal history of chemotherapy    Personal history of radiation therapy    Right thyroid  nodule     Patient Active Problem List   Diagnosis Date Noted   Prolapsed internal hemorrhoids, grade 2 02/18/2020   Lymphocytic colitis 01/13/2020   Hematochezia 10/13/2019   Loose stools 10/13/2019   Hair loss 12/12/2017   Elevated hemoglobin A1c 12/12/2017   Fatigue 12/12/2017   Osteopenia 05/03/2017   History of breast cancer 04/05/2016   Hemorrhoids 04/05/2016   Screening for colorectal cancer 04/05/2016   Osteopenia determined by x-ray 09/25/2014   High risk medication use 09/25/2014   S/P total hysterectomy and bilateral salpingo-oophorectomy 06/16/2014   Fecal occult blood test positive 04/29/2014   Right thyroid  nodule 04/21/2013   BRCA negative 02/14/2013   Infiltrating ductal carcinoma of 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) 01/04/2011    Past Surgical History:  Procedure Laterality Date   ABDOMINAL HYSTERECTOMY N/A  06/16/2014   Procedure: HYSTERECTOMY ABDOMINAL;  Surgeon: Albino Hum, MD;  Location: [facility];  Service: Gynecology;  Laterality: N/A;   BIOPSY  11/07/2019   Procedure: BIOPSY;  Surgeon: Suzette Espy, MD;  Location: [facility];  Service: Endoscopy;;   BREAST CYST ASPIRATION  01/2012   BREAST CYST EXCISION     BREAST LUMPECTOMY     right   CESAREAN SECTION     X 2   CHOLECYSTECTOMY     COLONOSCOPY  03/11/2012   Procedure: COLONOSCOPY;  Surgeon: Alyce Jubilee, MD;  Location: [facility];  Service: Endoscopy;  Laterality: N/A;  9:30 AM-changed to 9:50 Doris notified   COLONOSCOPY N/A 11/07/2019   Normal colon and TI. Non-bleeding internal hemorrhoids. S/p biopsy. Lymphocytic colitis. Prescribed budesonide .    LIPOMA EXCISION     from back   SALPINGOOPHORECTOMY Bilateral 06/16/2014   Procedure: BILATERAL SALPINGO OOPHORECTOMY;  Surgeon: Albino Hum, MD;  Location: [facility];  Service: Gynecology;  Laterality: Bilateral;   SCAR REVISION N/A 06/16/2014   Procedure: SCAR REVISION;  Surgeon: Albino Hum, MD;  Location: [facility];  Service: Gynecology;  Laterality: N/A;    OB History     Gravida  2   Para  2   Term      Preterm      AB      Living  2      SAB  IAB      Ectopic      Multiple      Live Births  2            Home Medications    Prior to Admission medications   Medication Sig Start Date End Date Taking? Authorizing Provider  Calcium Carb-Cholecalciferol (CALCIUM 600 + D PO) Take 1 tablet by mouth in the morning and at bedtime.     [provider]  cetirizine (ZYRTEC) 10 MG tablet Take 10 mg by mouth daily. Takes Mon, Wed, and Fri    [provider]  diphenhydramine -acetaminophen  (TYLENOL  PM) 25-500 MG TABS tablet Take 1 tablet by mouth as needed (sleep).     [provider]  FARXIGA 5 MG TABS tablet Take 5 mg by mouth daily. 09/22/19   [provider]  Multiple Vitamins-Calcium (ONE-A-DAY WOMENS PO) Take  1 tablet by mouth daily.     [provider]  pravastatin (PRAVACHOL) 20 MG tablet Take 20 mg by mouth at bedtime. 02/25/22   [provider]  Probiotic Product (ALIGN PO) Take 1 capsule by mouth daily.     [provider]  venlafaxine  (EFFEXOR ) 37.5 MG tablet TAKE (1) TABLET BY MOUTH THREE TIMES DAILY WITH FOOD. Patient taking differently: Take 37.5 mg by mouth daily. 12/30/15   Doretta Gant, PA-C    Family History Family History  Problem Relation Age of Onset   Cancer Mother        breast   Kidney failure Father    Cancer Maternal Grandmother        colon   Colon cancer Maternal Grandmother    Cancer Paternal Grandmother        ovarian   Cancer Sister        melanoma    Social History Social History   Tobacco Use   Smoking status: Never   Smokeless tobacco: Never  Substance Use Topics   Alcohol use: No   Drug use: No     Allergies   Banana and Kiwi extract   Review of Systems Review of Systems Per HPI  Physical Exam Triage Vital Signs ED Triage Vitals  Encounter Vitals Group     BP 09/30/23 1115 132/78     Systolic BP Percentile --      Diastolic BP Percentile --      Pulse Rate 09/30/23 1115 73     Resp 09/30/23 1115 18     Temp 09/30/23 1115 98.6 F (37 C)     Temp Source 09/30/23 1115 Oral     SpO2 09/30/23 1115 96 %     Weight --      Height --      Head Circumference --      Peak Flow --      Pain Score 09/30/23 1119 0     Pain Loc --      Pain Education --      Exclude from Growth Chart --    No data found.  Updated Vital Signs BP 132/78 (BP Location: Right Arm)   Pulse 73   Temp 98.6 F (37 C) (Oral)   Resp 18   SpO2 96%   Visual Acuity Right Eye Distance:   Left Eye Distance:   Bilateral Distance:    Right Eye Near:   Left Eye Near:    Bilateral Near:     Physical Exam Vitals and nursing note reviewed.  Constitutional:      Appearance:  Normal appearance. She is not ill-appearing.  HENT:      Head: Atraumatic.     Ears:     Comments: Mild bilateral middle ear effusions    Mouth/Throat:     Mouth: Mucous membranes are moist.     Pharynx: Oropharynx is clear.  Eyes:     Extraocular Movements: Extraocular movements intact.     Conjunctiva/sclera: Conjunctivae normal.  Cardiovascular:     Rate and Rhythm: Normal rate and regular rhythm.     Heart sounds: Normal heart sounds.  Pulmonary:     Effort: Pulmonary effort is normal.     Breath sounds: Normal breath sounds.  Musculoskeletal:        General: Normal range of motion.     Cervical back: Normal range of motion and neck supple.  Lymphadenopathy:     Cervical: Cervical adenopathy present.  Skin:    General: Skin is warm and dry.     Comments: Small area of erythema and edema surrounding a tick bite to the left base of neck  Neurological:     Mental Status: She is alert and oriented to person, place, and time.     Motor: No weakness.     Gait: Gait normal.  Psychiatric:        Mood and Affect: Mood normal.        Thought Content: Thought content normal.        Judgment: Judgment normal.      UC Treatments / Results  Labs (all labs ordered are listed, but only abnormal results are displayed) Labs Reviewed - No data to display  EKG   Radiology No results found.  Procedures Procedures (including critical care time)  Medications Ordered in UC Medications - No data to display  Initial Impression / Assessment and Plan / UC Course  I have reviewed the triage vital signs and the nursing notes.  Pertinent labs & imaging results that were available during my care of the patient were reviewed by me and considered in my medical decision making (see chart for details).     Continue Astelin, antihistamines, decongestants as needed for middle ear effusions.  Discussed warm compresses, anti-inflammatory medications as needed for the adenopathy and follow-up with PCP if worsening or unresolving.  Do suspect  secondary to inflammatory cause from middle ear effusions.  Tick bite appears to be healing well, discussed steroid creams topically as needed.  Final Clinical Impressions(s) / UC Diagnoses   Final diagnoses:  Cervical lymphadenopathy  Acute MEE (middle ear effusion), bilateral     Discharge Instructions      Continue the Astelin nasal spray twice daily, antihistamine daily and you may use a decongestant such as Sudafed as needed to help with the ear pressure.  Continue to monitor the swollen lymph nodes, you may apply heat and take ibuprofen  as needed.  Follow-up with primary care for a recheck    ED Prescriptions   None    PDMP not reviewed this encounter.   Corbin Dess, [place] 09/30/23 1203    Tacy Expose [place], [place] 09/30/23 1204

## 2023-10-10 ENCOUNTER — Observation Stay (HOSPITAL_COMMUNITY)
Admission: EM | Admit: 2023-10-10 | Discharge: 2023-10-11 | Disposition: A | Discharge status: Home / Self Care | Attending: Family Medicine | Admitting: Family Medicine

## 2023-10-10 ENCOUNTER — Inpatient Hospital Stay (HOSPITAL_COMMUNITY)

## 2023-10-10 ENCOUNTER — Encounter (HOSPITAL_COMMUNITY): Payer: Self-pay

## 2023-10-10 ENCOUNTER — Other Ambulatory Visit: Payer: Self-pay

## 2023-10-10 ENCOUNTER — Emergency Department (HOSPITAL_COMMUNITY)

## 2023-10-10 DIAGNOSIS — R42 Dizziness and giddiness: Secondary | ICD-10-CM | POA: Diagnosis not present

## 2023-10-10 DIAGNOSIS — E876 Hypokalemia: Secondary | ICD-10-CM | POA: Diagnosis present

## 2023-10-10 DIAGNOSIS — I639 Cerebral infarction, unspecified: Principal | ICD-10-CM | POA: Diagnosis present

## 2023-10-10 DIAGNOSIS — I6389 Other cerebral infarction: Principal | ICD-10-CM | POA: Insufficient documentation

## 2023-10-10 DIAGNOSIS — E041 Nontoxic single thyroid nodule: Secondary | ICD-10-CM | POA: Diagnosis not present

## 2023-10-10 DIAGNOSIS — Z79899 Other long term (current) drug therapy: Secondary | ICD-10-CM | POA: Insufficient documentation

## 2023-10-10 DIAGNOSIS — K52832 Lymphocytic colitis: Secondary | ICD-10-CM | POA: Diagnosis present

## 2023-10-10 DIAGNOSIS — I10 Essential (primary) hypertension: Secondary | ICD-10-CM | POA: Diagnosis not present

## 2023-10-10 DIAGNOSIS — Z8673 Personal history of transient ischemic attack (TIA), and cerebral infarction without residual deficits: Secondary | ICD-10-CM | POA: Diagnosis not present

## 2023-10-10 DIAGNOSIS — Z853 Personal history of malignant neoplasm of breast: Secondary | ICD-10-CM | POA: Diagnosis not present

## 2023-10-10 DIAGNOSIS — I6782 Cerebral ischemia: Secondary | ICD-10-CM | POA: Diagnosis not present

## 2023-10-10 DIAGNOSIS — C50919 Malignant neoplasm of unspecified site of unspecified female breast: Secondary | ICD-10-CM | POA: Diagnosis not present

## 2023-10-10 LAB — CBC WITH DIFFERENTIAL/PLATELET
Abs Immature Granulocytes: 0.03 10*3/uL (ref 0.00–0.07)
Basophils Absolute: 0 10*3/uL (ref 0.0–0.1)
Basophils Relative: 0 %
Eosinophils Absolute: 0.1 10*3/uL (ref 0.0–0.5)
Eosinophils Relative: 2 %
HCT: 41 % (ref 36.0–46.0)
Hemoglobin: 14.9 g/dL (ref 12.0–15.0)
Immature Granulocytes: 0 %
Lymphocytes Relative: 34 %
Lymphs Abs: 3.2 10*3/uL (ref 0.7–4.0)
MCH: 35.1 pg — ABNORMAL HIGH (ref 26.0–34.0)
MCHC: 36.3 g/dL — ABNORMAL HIGH (ref 30.0–36.0)
MCV: 96.5 fL (ref 80.0–100.0)
Monocytes Absolute: 0.5 10*3/uL (ref 0.1–1.0)
Monocytes Relative: 5 %
Neutro Abs: 5.5 10*3/uL (ref 1.7–7.7)
Neutrophils Relative %: 59 %
Platelets: 272 10*3/uL (ref 150–400)
RBC: 4.25 MIL/uL (ref 3.87–5.11)
RDW: 12.7 % (ref 11.5–15.5)
WBC: 9.3 10*3/uL (ref 4.0–10.5)
nRBC: 0 % (ref 0.0–0.2)

## 2023-10-10 LAB — I-STAT CHEM 8, ED
BUN: 23 mg/dL (ref 8–23)
Calcium, Ion: 1.17 mmol/L (ref 1.15–1.40)
Chloride: 99 mmol/L (ref 98–111)
Creatinine, Ser: 1 mg/dL (ref 0.44–1.00)
Glucose, Bld: 145 mg/dL — ABNORMAL HIGH (ref 70–99)
HCT: 43 % (ref 36.0–46.0)
Hemoglobin: 14.6 g/dL (ref 12.0–15.0)
Potassium: 3.2 mmol/L — ABNORMAL LOW (ref 3.5–5.1)
Sodium: 137 mmol/L (ref 135–145)
TCO2: 25 mmol/L (ref 22–32)

## 2023-10-10 LAB — BASIC METABOLIC PANEL WITH GFR
Anion gap: 13 (ref 5–15)
BUN: 19 mg/dL (ref 8–23)
CO2: 23 mmol/L (ref 22–32)
Calcium: 9.7 mg/dL (ref 8.9–10.3)
Chloride: 100 mmol/L (ref 98–111)
Creatinine, Ser: 0.95 mg/dL (ref 0.44–1.00)
GFR, Estimated: >60 mL/min (ref >60)
Glucose, Bld: 140 mg/dL — ABNORMAL HIGH (ref 70–99)
Potassium: 3.2 mmol/L — ABNORMAL LOW (ref 3.5–5.1)
Sodium: 136 mmol/L (ref 135–145)

## 2023-10-10 LAB — ETHANOL: Alcohol, Ethyl (B): <15 mg/dL (ref <15)

## 2023-10-10 LAB — PROTIME-INR
INR: 0.9 (ref 0.8–1.2)
Prothrombin Time: 12.6 s (ref 11.4–15.2)

## 2023-10-10 LAB — TSH: TSH: 7.412 u[IU]/mL — ABNORMAL HIGH (ref 0.350–4.500)

## 2023-10-10 MED ORDER — SODIUM CHLORIDE 0.9 % IV SOLN
INTRAVENOUS | Status: AC
Start: 2023-10-10 — End: 2023-10-11

## 2023-10-10 MED ORDER — ATORVASTATIN CALCIUM 40 MG PO TABS
40.0000 mg | ORAL_TABLET | Freq: Every day | ORAL | Status: DC
  Administered 2023-10-10 – 2023-10-11 (×2): 40 mg via ORAL
  Filled 2023-10-10 (×2): qty 1

## 2023-10-10 MED ORDER — MECLIZINE HCL 12.5 MG PO TABS
12.5000 mg | ORAL_TABLET | Freq: Three times a day (TID) | ORAL | Status: DC | PRN
  Administered 2023-10-11: 12.5 mg via ORAL
  Filled 2023-10-10: qty 1

## 2023-10-10 MED ORDER — ENOXAPARIN SODIUM 40 MG/0.4ML IJ SOSY
40.0000 mg | PREFILLED_SYRINGE | INTRAMUSCULAR | Status: DC
  Administered 2023-10-10: 40 mg via SUBCUTANEOUS
  Filled 2023-10-10: qty 0.4

## 2023-10-10 MED ORDER — LORATADINE 10 MG PO TABS
10.0000 mg | ORAL_TABLET | Freq: Every day | ORAL | Status: DC
  Administered 2023-10-10 – 2023-10-11 (×2): 10 mg via ORAL
  Filled 2023-10-10 (×2): qty 1

## 2023-10-10 MED ORDER — ACETAMINOPHEN 325 MG PO TABS
650.0000 mg | ORAL_TABLET | ORAL | Status: DC | PRN
  Administered 2023-10-11: 650 mg via ORAL
  Filled 2023-10-10 (×2): qty 2

## 2023-10-10 MED ORDER — IOHEXOL 350 MG/ML SOLN
75.0000 mL | Freq: Once | INTRAVENOUS | Status: AC | PRN
  Administered 2023-10-10: 75 mL via INTRAVENOUS

## 2023-10-10 MED ORDER — POTASSIUM CHLORIDE CRYS ER 20 MEQ PO TBCR
40.0000 meq | EXTENDED_RELEASE_TABLET | Freq: Once | ORAL | Status: AC
  Administered 2023-10-10: 40 meq via ORAL
  Filled 2023-10-10: qty 2

## 2023-10-10 MED ORDER — VENLAFAXINE HCL 75 MG PO TABS
37.5000 mg | ORAL_TABLET | Freq: Every day | ORAL | Status: DC
  Administered 2023-10-10 – 2023-10-11 (×2): 37.5 mg via ORAL
  Filled 2023-10-10: qty 0.5
  Filled 2023-10-10: qty 1
  Filled 2023-10-10 (×2): qty 0.5
  Filled 2023-10-10: qty 1

## 2023-10-10 MED ORDER — ASPIRIN 81 MG PO TBEC
81.0000 mg | DELAYED_RELEASE_TABLET | Freq: Every day | ORAL | Status: DC
  Administered 2023-10-11: 81 mg via ORAL
  Filled 2023-10-10: qty 1

## 2023-10-10 MED ORDER — STROKE: EARLY STAGES OF RECOVERY BOOK
Freq: Once | Status: AC
  Filled 2023-10-10: qty 1

## 2023-10-10 MED ORDER — GADOBUTROL 1 MMOL/ML IV SOLN
7.0000 mL | Freq: Once | INTRAVENOUS | Status: AC | PRN
  Administered 2023-10-10: 7 mL via INTRAVENOUS

## 2023-10-10 MED ORDER — SENNOSIDES-DOCUSATE SODIUM 8.6-50 MG PO TABS: 1.0000 | ORAL_TABLET | Freq: Every evening | ORAL | Status: DC | PRN

## 2023-10-10 MED ORDER — ACETAMINOPHEN 160 MG/5ML PO SOLN: 650.0000 mg | ORAL | Status: DC | PRN

## 2023-10-10 MED ORDER — ACETAMINOPHEN 650 MG RE SUPP: 650.0000 mg | RECTAL | Status: DC | PRN

## 2023-10-10 MED ORDER — CLOPIDOGREL BISULFATE 75 MG PO TABS
75.0000 mg | ORAL_TABLET | Freq: Every day | ORAL | Status: DC
  Administered 2023-10-10 – 2023-10-11 (×2): 75 mg via ORAL
  Filled 2023-10-10 (×2): qty 1

## 2023-10-10 MED ORDER — ASPIRIN 325 MG PO TABS
325.0000 mg | ORAL_TABLET | Freq: Once | ORAL | Status: AC
  Administered 2023-10-10: 325 mg via ORAL
  Filled 2023-10-10: qty 1

## 2023-10-10 NOTE — Assessment & Plan Note (Signed)
 Follows with PA - [place].  2011- History of stage IIa right-sided breast cancer, underwent chemotherapy, radiation therapy and lumpectomy.  Under surveillance.

## 2023-10-10 NOTE — Assessment & Plan Note (Signed)
 Replete K, check mag

## 2023-10-10 NOTE — Assessment & Plan Note (Signed)
 Presenting with dizziness.  Initially started 3 weeks ago, improved with meclizine, severe yesterday.  MRI shows punctate acute cortical infarct at the right parieto-occipital junction, other small chronic infarcts. ??  If infarct corresponds to symptoms.  All other etiology for her dizziness or contributing- ??BPPV, vestibular neuronitis. - EDP was up to Dr. Lindzen, admit as needed pain, aspirin, Plavix, CTA head and neck -Echocardiogram -PT eval -Hemoglobin A1c, Lipid Panel - Meclizine as needed -Check orthostatic vitals - N/s + 20 kcl 100cc/hr x 20hrs

## 2023-10-10 NOTE — ED Triage Notes (Signed)
 Pt reports she has been having intermittent dizziness with fluid in her ears and pressure in her head x 2 weeks.  Pt reports she saw a doctor and was feeling better with meclizine, nasal spray and nausea meds but it seems to have returned with a vengeance today and they are not helping today.

## 2023-10-10 NOTE — ED Provider Notes (Signed)
 [place] [facility]PART[facility] AT [facility] Provider Note   CSN: [id number] Arrival date & time: 10/10/23  1136     History  Chief Complaint  Patient presents with   Dizziness    Vanessa Neal is a 63 y.o. female.  She has a remote history of breast cancer.  She has had 3 weeks of intermittent headache dizziness.  Saw urgent care and tried meclizine nasal spray nausea medication.  Symptoms much worse today.  She said she was sweaty and clammy when it happened and had to vomit.  She does not think she has had a fever.  No focal weakness or numbness no blurry vision or double vision.  She thinks she might of had vertigo many years ago.  She describes the dizziness as both room spinning and lightheadedness.  The history is provided by the patient and the spouse.  Dizziness Quality:  Head spinning and lightheadedness Severity:  Moderate Onset quality:  Gradual Duration:  3 weeks Timing:  Intermittent Progression:  Worsening Chronicity:  New Relieved by:  Nothing Associated symptoms: headaches, nausea and vomiting   Associated symptoms: no chest pain, no shortness of breath, no syncope, no vision changes and no weakness   Risk factors: no hx of stroke        Home Medications Prior to Admission medications   Medication Sig Start Date End Date Taking? Authorizing Provider  Calcium Carb-Cholecalciferol (CALCIUM 600 + D PO) Take 1 tablet by mouth in the morning and at bedtime.     [provider]  cetirizine (ZYRTEC) 10 MG tablet Take 10 mg by mouth daily. Takes Mon, Wed, and Fri    [provider]  diphenhydramine -acetaminophen  (TYLENOL  PM) 25-500 MG TABS tablet Take 1 tablet by mouth as needed (sleep).     [provider]  FARXIGA 5 MG TABS tablet Take 5 mg by mouth daily. 09/22/19   [provider]  Multiple Vitamins-Calcium (ONE-A-DAY WOMENS PO) Take 1 tablet by mouth daily.     [provider]  pravastatin (PRAVACHOL) 20 MG  tablet Take 20 mg by mouth at bedtime. 02/25/22   [provider]  Probiotic Product (ALIGN PO) Take 1 capsule by mouth daily.     [provider]  venlafaxine  (EFFEXOR ) 37.5 MG tablet TAKE (1) TABLET BY MOUTH THREE TIMES DAILY WITH FOOD. Patient taking differently: Take 37.5 mg by mouth daily. 12/30/15   Kefalas, Thomas S, PA-C      Allergies    Banana and Kiwi extract    Review of Systems   Review of Systems  Constitutional:  Positive for diaphoresis. Negative for fever.  Eyes:  Negative for visual disturbance.  Respiratory:  Negative for shortness of breath.   Cardiovascular:  Negative for chest pain and syncope.  Gastrointestinal:  Positive for nausea and vomiting.  Neurological:  Positive for dizziness, light-headedness and headaches. Negative for speech difficulty and weakness.    Physical Exam Updated Vital Signs BP (!) 149/91 (BP Location: Right Arm)   Pulse 75   Temp 99 F (37.2 C)   Resp 18   Ht 5\' 3"  (1.6 m)   Wt 79.4 kg   SpO2 100%   BMI 31.00 kg/m  Physical Exam Vitals and nursing note reviewed.  Constitutional:      General: She is not in acute distress.    Appearance: Normal appearance. She is well-developed.  HENT:     Head: Normocephalic and atraumatic.  Eyes:     Conjunctiva/sclera: Conjunctivae  normal.     Comments: No nystagmus  Cardiovascular:     Rate and Rhythm: Normal rate and regular rhythm.     Heart sounds: No murmur heard. Pulmonary:     Effort: Pulmonary effort is normal. No respiratory distress.     Breath sounds: Normal breath sounds. No stridor. No wheezing.  Abdominal:     Palpations: Abdomen is soft.     Tenderness: There is no abdominal tenderness. There is no guarding or rebound.  Musculoskeletal:        General: No tenderness or deformity. Normal range of motion.     Cervical back: Neck supple.  Skin:    General: Skin is warm and dry.  Neurological:     General: No focal deficit present.     Mental Status:  She is alert and oriented to person, place, and time.     GCS: GCS eye subscore is 4. GCS verbal subscore is 5. GCS motor subscore is 6.     Cranial Nerves: No cranial nerve deficit.     Sensory: No sensory deficit.     Motor: No weakness.     Gait: Gait normal.     ED Results / Procedures / Treatments   Labs (all labs ordered are listed, but only abnormal results are displayed) Labs Reviewed  CBC WITH DIFFERENTIAL/PLATELET - Abnormal; Notable for the following components:      Result Value   MCH 35.1 (*)    MCHC 36.3 (*)    All other components within normal limits  TSH - Abnormal; Notable for the following components:   TSH 7.412 (*)    All other components within normal limits  I-STAT CHEM 8, ED - Abnormal; Notable for the following components:   Potassium 3.2 (*)    Glucose, Bld 145 (*)    All other components within normal limits  PROTIME-INR  ETHANOL  BASIC METABOLIC PANEL WITH GFR  HEMOGLOBIN A1C  HIV ANTIBODY (ROUTINE TESTING W REFLEX)  LIPID PANEL    EKG None  Radiology MR Brain W and Wo Contrast Result Date: 10/10/2023 CLINICAL DATA:  Provided history: Neuro deficit, acute, stroke suspected. Dizziness. History of breast cancer. EXAM: MRI HEAD WITHOUT AND WITH CONTRAST TECHNIQUE: Multiplanar, multiecho pulse sequences of the brain and surrounding structures were obtained without and with intravenous contrast. CONTRAST:  7mL GADAVIST GADOBUTROL 1 MMOL/ML IV SOLN COMPARISON:  None. FINDINGS: Brain: No age-advanced or lobar predominant cerebral atrophy. Punctate focus of cortical restricted diffusion at the right parietooccipital junction (series 5, image 30). There is no corresponding enhancement at this site and this is consistent with an acute infarct. Tiny chronic cortical infarct within the right parietal lobe (series 11, image 18). Possible small chronic cortical infarct within the left parietal lobe (versus artifact) (series 11, image 22). Several small foci of T2  FLAIR hyperintense signal abnormality scattered within the bilateral cerebral white matter, nonspecific but compatible with changes of chronic small vessel ischemia. Small chronic infarcts within the bilateral cerebellar hemispheres. No cortical encephalomalacia is identified. No evidence of an intracranial mass. No chronic intracranial blood products. No extra-axial fluid collection. No midline shift. No pathologic intracranial enhancement identified. Vascular: Maintained flow voids within the proximal large arterial vessels. Skull and upper cervical spine: No focal worrisome marrow lesion. Sinuses/Orbits: No mass or acute finding within the imaged orbits. Susceptibility artifact partially obscures the left maxillary sinus. No significant paranasal sinus disease is visible. IMPRESSION: 1. Punctate acute cortical infarct at the right parietooccipital junction. 2.  No evidence of intracranial metastatic disease. 3. Tiny chronic cortical infarct within the right parietal lobe. 4. Possible small chronic cortical infarct within the left parietal lobe (versus artifact). 5. Mild chronic small vessel ischemic changes within the cerebral white matter. 6. Small chronic infarcts within the bilateral cerebellar hemispheres. Electronically Signed   By: Bascom Lily D.O.   On: 10/10/2023 14:27    Procedures Procedures    Medications Ordered in ED Medications  clopidogrel (PLAVIX) tablet 75 mg (75 mg Oral Given 10/10/23 1516)  loratadine (CLARITIN) tablet 10 mg (10 mg Oral Given 10/10/23 1709)  meclizine (ANTIVERT) tablet 12.5 mg (has no administration in time range)  venlafaxine  (EFFEXOR ) tablet 37.5 mg (37.5 mg Oral Given 10/10/23 1709)  atorvastatin (LIPITOR) tablet 40 mg (40 mg Oral Given 10/10/23 1709)   stroke: early stages of recovery book (has no administration in time range)  0.9 %  sodium chloride  infusion (has no administration in time range)  acetaminophen  (TYLENOL ) tablet 650 mg (has no administration in  time range)    Or  acetaminophen  (TYLENOL ) 160 MG/5ML solution 650 mg (has no administration in time range)    Or  acetaminophen  (TYLENOL ) suppository 650 mg (has no administration in time range)  senna-docusate (Senokot-S) tablet 1 tablet (has no administration in time range)  enoxaparin (LOVENOX) injection 40 mg (has no administration in time range)  aspirin EC tablet 81 mg (has no administration in time range)  gadobutrol (GADAVIST) 1 MMOL/ML injection 7 mL (7 mLs Intravenous Contrast Given 10/10/23 1325)  aspirin tablet 325 mg (325 mg Oral Given 10/10/23 1516)  potassium chloride SA (KLOR-CON M) CR tablet 40 mEq (40 mEq Oral Given 10/10/23 1709)  iohexol (OMNIPAQUE) 350 MG/ML injection 75 mL (75 mLs Intravenous Contrast Given 10/10/23 1641)    ED Course/ Medical Decision Making/ A&P Clinical Course as of 10/10/23 1712  Wed Oct 10, 2023  1459 Discussed with Dr. Renaee Caro neurology.  He is recommending admission to the hospital, aspirin Plavix, CTA head neck, telemetry.  Will review with patient and consult hospitalist [MB]  1520 Discussed with Dr. Quintella Buck [facility] hospitalist who will evaluate patient for admission.  Patient was updated on plan and she is agreeable for admission [MB]    Clinical Course User Index [MB] Tonya Fredrickson, MD                                 Medical Decision Making Amount and/or Complexity of Data Reviewed Labs: ordered. Radiology: ordered.  Risk OTC drugs. Prescription drug management. Decision regarding hospitalization.   This patient complains of dizziness headache; this involves an extensive number of treatment Options and is a complaint that carries with it a high risk of complications and morbidity. The differential includes vertigo, dehydration, dizziness, stroke, bleed, mass  I ordered, reviewed and interpreted labs, which included CBC normal, i-STAT chemistries with mildly low potassium I ordered medication aspirin and Plavix and reviewed PMP  when indicated. I ordered imaging studies which included MRI brain and I independently    visualized and interpreted imaging which showed acute punctate stroke Additional history obtained from patient's husband Previous records obtained and reviewed in epic, no recent admissions I consulted neurology Dr. Renaee Caro and [facility] hospitalist Dr. Quintella Buck I and discussed lab and imaging findings and discussed disposition.  Cardiac monitoring reviewed, sinus rhythm Social determinants considered, no significant barriers Critical Interventions: None  After the interventions stated above, I reevaluated the  patient and found patient to be fairly asymptomatic with stable vitals Admission and further testing considered, she would benefit from mission to the hospital for further stroke workup.  She is agreeable to plan for admission.         Final Clinical Impression(s) / ED Diagnoses Final diagnoses:  Acute CVA (cerebrovascular accident) (HCC)  Dizziness    Rx / DC Orders ED Discharge Orders     None         Tonya Fredrickson, MD 10/10/23 1715

## 2023-10-10 NOTE — Assessment & Plan Note (Signed)
 Stable.  Chronic unchanged loose stools.

## 2023-10-10 NOTE — Plan of Care (Signed)
  Problem: Education: Goal: Knowledge of secondary prevention will improve (MUST DOCUMENT ALL) Outcome: Progressing Goal: Knowledge of patient specific risk factors will improve (DELETE if not current risk factor) Outcome: Progressing   Problem: Ischemic Stroke/TIA Tissue Perfusion: Goal: Complications of ischemic stroke/TIA will be minimized Outcome: Progressing   Problem: Coping: Goal: Will identify appropriate support needs Outcome: Progressing

## 2023-10-10 NOTE — Progress Notes (Signed)
   10/10/23 1959  TOC Brief Assessment  Insurance and Status Reviewed  Patient has primary care physician Yes  Home environment has been reviewed From home  Prior level of function: Independent  Prior/Current Home Services No current home services  Social Drivers of Health Review SDOH reviewed no interventions necessary  Readmission risk has been reviewed Yes  Transition of care needs no transition of care needs at this time   Transition of Care Department [facility]) has reviewed patient and no other TOC needs have been identified at this time. We will continue to monitor patient advancement through interdisciplinary progression rounds. If new patient needs arise, please place a TOC consult.

## 2023-10-10 NOTE — Assessment & Plan Note (Signed)
 Stable. -Hold hydrochlorothiazide, allow for permissive hypertension in the setting of acute stroke

## 2023-10-10 NOTE — H&P (Signed)
 History and Physical    Vanessa Neal WUJ:[id number] DOB: 06/19/1960 DOA: 10/10/2023  PCP: Wendi Ham, NP   Patient coming from: Home  I have personally briefly reviewed patient's old medical records in [facility]  Chief Complaint: Dizziness  HPI: Vanessa Neal is a 63 y.o. female with medical history significant for breast cancer, microcytic colitis. Patient presented to the ED with complaints of dizziness started 3 weeks ago.  She went to an urgent care, she was given meclizine, was also diagnosed with middle ear effusions.  This helped with the dizziness.  She was also given nasal sprays and Zofran .  She reports yesterday symptoms returned and were very severe, with headache, and vomiting.  Reports dizziness when lying down and standing up. No weakness of her extremity, no facial asymmetry, no change in speech.  No prior strokes.  ED Course: Temperature 99.  Heart rate 60s to 70s.  Blood pressure 116-149.  O2 sat greater than 98% on room air. Potassium 3.2. MRI showed -punctate acute cortical infarct at the right parieto-occipital junction.  Also tiny chronic cortical infarct in the right parietal lobe, and small chronic infarcts within the bilateral cerebellar hemispheres. EDP talked to Dr. Lindzen, recommended admission here, aspirin Plavix, CTA head and neck.  Review of Systems: As per HPI all other systems reviewed and negative.  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) 01/04/2011   Ovarian cyst    Personal history of chemotherapy    Personal history of radiation therapy    Right thyroid  nodule     Past Surgical History:  Procedure Laterality Date   ABDOMINAL HYSTERECTOMY N/A 06/16/2014   Procedure: HYSTERECTOMY ABDOMINAL;  Surgeon: Albino Hum, MD;  Location: [facility];  Service:  Gynecology;  Laterality: N/A;   BIOPSY  11/07/2019   Procedure: BIOPSY;  Surgeon: Suzette Espy, MD;  Location: [facility];  Service: Endoscopy;;   BREAST CYST ASPIRATION  01/2012   BREAST CYST EXCISION     BREAST LUMPECTOMY     right   CESAREAN SECTION     X 2   CHOLECYSTECTOMY     COLONOSCOPY  03/11/2012   Procedure: COLONOSCOPY;  Surgeon: Alyce Jubilee, MD;  Location: [facility];  Service: Endoscopy;  Laterality: N/A;  9:30 AM-changed to 9:50 Doris notified   COLONOSCOPY N/A 11/07/2019   Normal colon and TI. Non-bleeding internal hemorrhoids. S/p biopsy. Lymphocytic colitis. Prescribed budesonide .    LIPOMA EXCISION     from back   SALPINGOOPHORECTOMY Bilateral 06/16/2014   Procedure: BILATERAL SALPINGO OOPHORECTOMY;  Surgeon: Albino Hum, MD;  Location: [facility];  Service: Gynecology;  Laterality: Bilateral;   SCAR REVISION N/A 06/16/2014   Procedure: SCAR REVISION;  Surgeon: Albino Hum, MD;  Location: [facility];  Service: Gynecology;  Laterality: N/A;     reports that she has never smoked. She has never used smokeless tobacco. She reports that she does not drink alcohol and does not use drugs.  Allergies  Allergen Reactions   Banana Nausea Only   Kiwi Extract Nausea Only    Family History  Problem Relation Age of Onset   Cancer Mother        breast   Kidney failure Father    Cancer Maternal Grandmother  colon   Colon cancer Maternal Grandmother    Cancer Paternal Grandmother        ovarian   Cancer Sister        melanoma    Prior to Admission medications   Medication Sig Start Date End Date Taking? Authorizing Provider  azelastine (ASTELIN) 0.1 % nasal spray Place 2 sprays into both nostrils 2 (two) times daily. 09/24/23  Yes [provider]  hydrochlorothiazide (HYDRODIURIL) 12.5 MG tablet Take 1 tablet by mouth daily. 09/11/23  Yes [provider]  meclizine (ANTIVERT) 12.5 MG tablet Take 2 tablets 3 times a day by oral route for 5  days. 09/24/23  Yes [provider]  meloxicam (MOBIC) 15 MG tablet Take 1 tablet by mouth daily as needed. 11/13/22  Yes [provider]  ondansetron  (ZOFRAN -ODT) 8 MG disintegrating tablet DISSOLVE ONE TABLET UNDER THE TONGUE TWICE DAILY FOR 10 DAYS 09/24/23  Yes [provider]  azithromycin (ZITHROMAX) 250 MG tablet TAKE 2 TABLETS BY MOUTH ON DAY 1, THEN TAKE 1 TABLET DAILY ON DAYS 2-5    [provider]  Calcium Carb-Cholecalciferol (CALCIUM 600 + D PO) Take 1 tablet by mouth in the morning and at bedtime.     [provider]  cetirizine (ZYRTEC) 10 MG tablet Take 10 mg by mouth daily. Takes Mon, Wed, and Fri    [provider]  clobetasol (TEMOVATE) 0.05 % external solution Apply topically 2 (two) times daily as needed.    [provider]  diphenhydramine -acetaminophen  (TYLENOL  PM) 25-500 MG TABS tablet Take 1 tablet by mouth as needed (sleep).     [provider]  FARXIGA 5 MG TABS tablet Take 5 mg by mouth daily. 09/22/19   [provider]  meclizine (ANTIVERT) 25 MG tablet TAKE ONE TABLET BY MOUTH THREE TIMES DAILY FOR 5 DAYS    [provider]  methocarbamol (ROBAXIN) 750 MG tablet Take 1 tablet by mouth every 8 (eight) hours as needed.    [provider]  Multiple Vitamins-Calcium (ONE-A-DAY WOMENS PO) Take 1 tablet by mouth daily.     [provider]  pravastatin (PRAVACHOL) 20 MG tablet Take 20 mg by mouth at bedtime. 02/25/22   [provider]  Probiotic Product (ALIGN PO) Take 1 capsule by mouth daily.     [provider]  venlafaxine  (EFFEXOR ) 37.5 MG tablet TAKE (1) TABLET BY MOUTH THREE TIMES DAILY WITH FOOD. Patient taking differently: Take 37.5 mg by mouth daily. 12/30/15   Doretta Gant, PA-C    Physical Exam: Vitals:   10/10/23 1400 10/10/23 1415 10/10/23 1430 10/10/23 1445  BP: 128/83 116/88 132/83 (!) 143/74  Pulse: 69 64 65 64  Resp:      Temp:       SpO2: 100% 99% 96% 100%  Weight:      Height:        Constitutional: NAD, calm, comfortable Vitals:   10/10/23 1400 10/10/23 1415 10/10/23 1430 10/10/23 1445  BP: 128/83 116/88 132/83 (!) 143/74  Pulse: 69 64 65 64  Resp:      Temp:      SpO2: 100% 99% 96% 100%  Weight:      Height:       Eyes: PERRL, lids and conjunctivae normal ENMT: Mucous membranes are moist.  Neck: normal, supple, no masses, no thyromegaly Respiratory: clear to auscultation bilaterally, no wheezing, no crackles. Normal respiratory effort. No accessory muscle use.  Cardiovascular: Regular rate and rhythm, no  murmurs / rubs / gallops. No extremity edema. 2+ pedal pulses. No carotid bruits.  Abdomen: no tenderness, no masses palpated. No hepatosplenomegaly. Bowel sounds positive.  Musculoskeletal: no clubbing / cyanosis. No joint deformity upper and lower extremities. Good ROM, no contractures. Normal muscle tone.  Skin: no rashes, lesions, ulcers. No induration Neurologic: 5 of 5 strength bilateral upper and lower extremities, sensation intact globally, no facial asymmetry, speech fluent without evidence of aphasia,.  Psychiatric: Normal judgment and insight. Alert and oriented x 3. Normal mood.   Labs on Admission: I have personally reviewed following labs and imaging studies  CBC: Recent Labs  Lab 10/10/23 1220 10/10/23 1226  WBC 9.3  --   NEUTROABS 5.5  --   HGB 14.9 14.6  HCT 41.0 43.0  MCV 96.5  --   PLT 272  --    Basic Metabolic Panel: Recent Labs  Lab 10/10/23 1226  NA 137  K 3.2*  CL 99  GLUCOSE 145*  BUN 23  CREATININE 1.00   Radiological Exams on Admission: MR Brain W and Wo Contrast Result Date: 10/10/2023 CLINICAL DATA:  Provided history: Neuro deficit, acute, stroke suspected. Dizziness. History of breast cancer. EXAM: MRI HEAD WITHOUT AND WITH CONTRAST TECHNIQUE: Multiplanar, multiecho pulse sequences of the brain and surrounding structures were obtained without and with  intravenous contrast. CONTRAST:  7mL GADAVIST GADOBUTROL 1 MMOL/ML IV SOLN COMPARISON:  None. FINDINGS: Brain: No age-advanced or lobar predominant cerebral atrophy. Punctate focus of cortical restricted diffusion at the right parietooccipital junction (series 5, image 30). There is no corresponding enhancement at this site and this is consistent with an acute infarct. Tiny chronic cortical infarct within the right parietal lobe (series 11, image 18). Possible small chronic cortical infarct within the left parietal lobe (versus artifact) (series 11, image 22). Several small foci of T2 FLAIR hyperintense signal abnormality scattered within the bilateral cerebral white matter, nonspecific but compatible with changes of chronic small vessel ischemia. Small chronic infarcts within the bilateral cerebellar hemispheres. No cortical encephalomalacia is identified. No evidence of an intracranial mass. No chronic intracranial blood products. No extra-axial fluid collection. No midline shift. No pathologic intracranial enhancement identified. Vascular: Maintained flow voids within the proximal large arterial vessels. Skull and upper cervical spine: No focal worrisome marrow lesion. Sinuses/Orbits: No mass or acute finding within the imaged orbits. Susceptibility artifact partially obscures the left maxillary sinus. No significant paranasal sinus disease is visible. IMPRESSION: 1. Punctate acute cortical infarct at the right parietooccipital junction. 2. No evidence of intracranial metastatic disease. 3. Tiny chronic cortical infarct within the right parietal lobe. 4. Possible small chronic cortical infarct within the left parietal lobe (versus artifact). 5. Mild chronic small vessel ischemic changes within the cerebral white matter. 6. Small chronic infarcts within the bilateral cerebellar hemispheres. Electronically Signed   By: Bascom Lily D.O.   On: 10/10/2023 14:27   EKG: Pending  Assessment/Plan Principal  Problem:   Acute CVA (cerebrovascular accident) [facility]) Active Problems:   Hypokalemia   History of breast cancer   Lymphocytic colitis   HTN (hypertension)  Assessment and Plan: * Acute CVA (cerebrovascular accident) (HCC) Presenting with dizziness.  Initially started 3 weeks ago, improved with meclizine, severe yesterday.  MRI shows punctate acute cortical infarct at the right parieto-occipital junction, other small chronic infarcts. ??  If infarct corresponds to symptoms.  All other etiology for her dizziness or contributing- ??BPPV, vestibular neuronitis. - EDP was up to Dr. Lindzen, admit as needed pain, aspirin,  Plavix, CTA head and neck -Echocardiogram -PT eval -Hemoglobin A1c, Lipid Panel - Meclizine as needed -Check orthostatic vitals - N/s + 20 kcl 100cc/hr x 20hrs  Hypokalemia Replete K, check mag  HTN (hypertension) Stable. -Hold hydrochlorothiazide, allow for permissive hypertension in the setting of acute stroke  Lymphocytic colitis Stable.  Chronic unchanged loose stools.  History of breast cancer Follows with PA [place].  2011- History of stage IIa right-sided breast cancer, underwent chemotherapy, radiation therapy and lumpectomy.  Under surveillance.   DVT prophylaxis: Lovenox Code Status: FULL code Family Communication: Spouse at bedside Disposition Plan: ~ 2 days Consults called: None  Admission status: Inpt tele  I certify that at the point of admission it is my clinical judgment that the patient will require inpatient hospital care spanning beyond 2 midnights from the point of admission due to high intensity of service, high risk for further deterioration and high frequency of surveillance required.    Author: Pati Bonine, MD 10/10/2023 4:04 PM  For on call review [email or web address].

## 2023-10-11 ENCOUNTER — Observation Stay (HOSPITAL_BASED_OUTPATIENT_CLINIC_OR_DEPARTMENT_OTHER)

## 2023-10-11 ENCOUNTER — Other Ambulatory Visit (HOSPITAL_COMMUNITY): Payer: Self-pay | Admitting: *Deleted

## 2023-10-11 DIAGNOSIS — R29700 NIHSS score 0: Secondary | ICD-10-CM | POA: Diagnosis not present

## 2023-10-11 DIAGNOSIS — I739 Peripheral vascular disease, unspecified: Secondary | ICD-10-CM

## 2023-10-11 DIAGNOSIS — I6389 Other cerebral infarction: Secondary | ICD-10-CM

## 2023-10-11 DIAGNOSIS — Z853 Personal history of malignant neoplasm of breast: Secondary | ICD-10-CM | POA: Diagnosis not present

## 2023-10-11 DIAGNOSIS — I639 Cerebral infarction, unspecified: Secondary | ICD-10-CM | POA: Diagnosis not present

## 2023-10-11 DIAGNOSIS — E876 Hypokalemia: Secondary | ICD-10-CM | POA: Diagnosis not present

## 2023-10-11 DIAGNOSIS — I10 Essential (primary) hypertension: Secondary | ICD-10-CM | POA: Diagnosis not present

## 2023-10-11 LAB — ECHOCARDIOGRAM COMPLETE
AR max vel: 3.58 cm2
AV Area VTI: 3.38 cm2
AV Area mean vel: 3.28 cm2
AV Mean grad: 3 mmHg
AV Peak grad: 5.9 mmHg
Ao pk vel: 1.21 m/s
Area-P 1/2: 4.06 cm2
Height: 63 in
MV M vel: 5.04 m/s
MV Peak grad: 101.6 mmHg
S' Lateral: 2.7 cm
Weight: 2800.72 [oz_av]

## 2023-10-11 LAB — HEMOGLOBIN A1C
Hgb A1c MFr Bld: 6.8 % — ABNORMAL HIGH (ref 4.8–5.6)
Mean Plasma Glucose: 148 mg/dL

## 2023-10-11 LAB — GLUCOSE, CAPILLARY
Glucose-Capillary: 182 mg/dL — ABNORMAL HIGH (ref 70–99)
Glucose-Capillary: 81 mg/dL (ref 70–99)

## 2023-10-11 LAB — LIPID PANEL
Cholesterol: 179 mg/dL (ref 0–200)
HDL: 36 mg/dL — ABNORMAL LOW (ref >40)
LDL Cholesterol: 104 mg/dL — ABNORMAL HIGH (ref 0–99)
Total CHOL/HDL Ratio: 5 ratio
Triglycerides: 197 mg/dL — ABNORMAL HIGH (ref <150)
VLDL: 39 mg/dL (ref 0–40)

## 2023-10-11 LAB — HIV ANTIBODY (ROUTINE TESTING W REFLEX): HIV Screen 4th Generation wRfx: NONREACTIVE

## 2023-10-11 MED ORDER — CLOPIDOGREL BISULFATE 75 MG PO TABS: 75.0000 mg | ORAL_TABLET | Freq: Every day | ORAL | 0 refills | Status: DC

## 2023-10-11 MED ORDER — ASPIRIN 81 MG PO TBEC: 81.0000 mg | DELAYED_RELEASE_TABLET | Freq: Every day | ORAL | 3 refills | Status: AC

## 2023-10-11 MED ORDER — ATORVASTATIN CALCIUM 40 MG PO TABS: 40.0000 mg | ORAL_TABLET | Freq: Every day | ORAL | 11 refills | Status: AC

## 2023-10-11 MED ORDER — INSULIN ASPART 100 UNIT/ML IJ SOLN
0.0000 [IU] | Freq: Three times a day (TID) | INTRAMUSCULAR | Status: DC
  Administered 2023-10-11: 2 [IU] via SUBCUTANEOUS

## 2023-10-11 MED ORDER — CLOPIDOGREL BISULFATE 75 MG PO TABS: 75.0000 mg | ORAL_TABLET | Freq: Every day | ORAL | 0 refills | Status: AC

## 2023-10-11 MED ORDER — INSULIN ASPART 100 UNIT/ML IJ SOLN: 0.0000 [IU] | Freq: Every day | INTRAMUSCULAR | Status: DC

## 2023-10-11 MED ORDER — POTASSIUM CHLORIDE ER 10 MEQ PO TBCR
10.0000 meq | EXTENDED_RELEASE_TABLET | Freq: Every day | ORAL | 2 refills | Status: DC
Start: 2023-10-11 — End: 2024-01-09

## 2023-10-11 MED ORDER — ASPIRIN 81 MG PO TBEC: 81.0000 mg | DELAYED_RELEASE_TABLET | Freq: Every day | ORAL | 0 refills | Status: DC

## 2023-10-11 NOTE — Discharge Instructions (Signed)
 1)Please take Aspirin 81 mg daily along with Plavix 75 mg daily for 21 days then after that STOP the Plavix  and continue ONLY Aspirin 81 mg daily indefinitely--   2)Avoid ibuprofen /Advil /Aleve/Motrin /Goody Powders/Naproxen/BC powders/Meloxicam/Diclofenac/Indomethacin and other Nonsteroidal anti-inflammatory medications as these will make you more likely to bleed and can cause stomach ulcers, can also cause Kidney problems.   3)You will get a 30 day Heart Monitor in the mail within the next 7 business days--- please follow the instructions on the package  4) outpatient neurology follow-up has been scheduled for you

## 2023-10-11 NOTE — Evaluation (Signed)
 Physical Therapy Evaluation Patient Details Name: Vanessa Neal MRN: [id number] DOB: January 01, 1961 Today's Date: 10/11/2023  History of Present Illness  Vanessa Neal is a 63 y.o. female with medical history significant for breast cancer, microcytic colitis.  Patient presented to the ED with complaints of dizziness started 3 weeks ago.  She went to an urgent care, she was given meclizine, was also diagnosed with middle ear effusions.  This helped with the dizziness.  She was also given nasal sprays and Zofran .  She reports yesterday symptoms returned and were very severe, with headache, and vomiting.  Reports dizziness when lying down and standing up.  No weakness of her extremity, no facial asymmetry, no change in speech.  No prior strokes.   Clinical Impression  Patient functioning at baseline for functional mobility and gait demonstrating good return for ambulating in room, hallways and stairs without loss of balance or need for an AD. Plan:  Patient discharged from physical therapy to care of nursing for ambulation daily as tolerated for length of stay.          If plan is discharge home, recommend the following:     Can travel by private vehicle        Equipment Recommendations None recommended by PT  Recommendations for Other Services       Functional Status Assessment Patient has not had a recent decline in their functional status     Precautions / Restrictions Precautions Precautions: None Restrictions Weight Bearing Restrictions Per Provider Order: No      Mobility  Bed Mobility Overal bed mobility: Independent                  Transfers Overall transfer level: Independent                      Ambulation/Gait Ambulation/Gait assistance: Independent Gait Distance (Feet): 200 Feet Assistive device: None Gait Pattern/deviations: WFL(Within Functional Limits) Gait velocity: normal     General Gait Details: Good return for ambulating in room, hallways  without loss of balance or need for an AD  Stairs Stairs: Yes Stairs assistance: Modified independent (Device/Increase time) Stair Management: Two rails, Alternating pattern Number of Stairs: 4 General stair comments: demonstrates good return for going up/down stairs using bilateral side rails without loss of balance  Wheelchair Mobility     Tilt Bed    Modified Rankin (Stroke Patients Only)       Balance Overall balance assessment: Independent                                           Pertinent Vitals/Pain Pain Assessment Pain Assessment: Faces Pain Location: head Pain Descriptors / Indicators: Headache Pain Intervention(s): Monitored during session    Home Living Family/patient expects to be discharged to:: Private residence Living Arrangements: Spouse/significant other Available Help at Discharge: Family;Available 24 hours/day Type of Home: House Home Access: Stairs to enter Entrance Stairs-Rails: Can reach both Entrance Stairs-Number of Steps: 5   Home Layout: One level Home Equipment: Cane - quad;Cane - single point      Prior Function Prior Level of Function : Independent/Modified Independent                     Extremity/Trunk Assessment   Upper Extremity Assessment Upper Extremity Assessment: Defer to OT evaluation    Lower Extremity Assessment Lower Extremity  Assessment: Overall WFL for tasks assessed    Cervical / Trunk Assessment Cervical / Trunk Assessment: Normal  Communication   Communication Communication: No apparent difficulties    Cognition Arousal: Alert Behavior During Therapy: WFL for tasks assessed/performed   PT - Cognitive impairments: No apparent impairments                         Following commands: Intact       Cueing Cueing Techniques: Verbal cues     General Comments      Exercises     Assessment/Plan    PT Assessment Patient does not need any further PT services  PT  Problem List         PT Treatment Interventions      PT Goals (Current goals can be found in the Care Plan section)  Acute Rehab PT Goals Patient Stated Goal: return home PT Goal Formulation: With patient Time For Goal Achievement: 10/11/23 Potential to Achieve Goals: Good    Frequency       Co-evaluation PT/OT/SLP Co-Evaluation/Treatment: Yes Reason for Co-Treatment: To address functional/ADL transfers PT goals addressed during session: Mobility/safety with mobility;Balance OT goals addressed during session: ADL's and self-care       AM-PAC PT "6 Clicks" Mobility  Outcome Measure Help needed turning from your back to your side while in a flat bed without using bedrails?: None Help needed moving from lying on your back to sitting on the side of a flat bed without using bedrails?: None Help needed moving to and from a bed to a chair (including a wheelchair)?: None Help needed standing up from a chair using your arms (e.g., wheelchair or bedside chair)?: None Help needed to walk in hospital room?: None Help needed climbing 3-5 steps with a railing? : None 6 Click Score: 24    End of Session   Activity Tolerance: Patient tolerated treatment well Patient left: in bed;with call bell/phone within reach Nurse Communication: Mobility status PT Visit Diagnosis: Unsteadiness on feet (R26.81);Other abnormalities of gait and mobility (R26.89);Muscle weakness (generalized) (M62.81)    Time: 1610-9604 PT Time Calculation (min) (ACUTE ONLY): 21 min   Charges:   PT Evaluation $PT Eval Moderate Complexity: 1 Mod PT Treatments $Therapeutic Activity: 8-22 mins PT General Charges $$ ACUTE PT VISIT: 1 Visit         12:06 PM, 10/11/23 Walton Guppy, MPT Physical Therapist with [facility] [phone] office [phone] mobile phone

## 2023-10-11 NOTE — Consult Note (Signed)
 I connected with  Vanessa Neal on 10/11/23 by a video enabled telemedicine application and verified that I am speaking with the correct person using two identifiers.   I discussed the limitations of evaluation and management by telemedicine. The patient expressed understanding and agreed to proceed.  Location of patient: [facility] Location of physician: [facility]  Neurology Consultation Reason for Consult: stroke Referring Physician: Dr. Colin Dawley  CC: Dizziness  History is obtained from: Patient, chart review  HPI: Vanessa Neal is a 63 y.o. 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  Patient states for about 3 weeks every time she gets up she notices spinning sensation/room spinning.  She was seen at an urgent care and was told that she has bilateral middle ear effusions, was prescribed some nasal spray, meclizine.  States her symptoms improved but were still "on and off".  At times she also noticed some mild headache.  On Tuesday she had another episode of dizziness with profuse sweating as well as tingling all over.  She eventually came to the hospital yesterday.  MRI brain was done which showed acute infarct in the right parieto-occipital junction.  Therefore neurology was consulted.  Last known normal: Unclear Event happened at home No tPA and thrombectomy as outside window and no LVO mRS 0   ROS: All other systems reviewed and negative except as noted in the HPI.   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) 01/04/2011   Ovarian cyst    Personal history of chemotherapy    Personal history of radiation therapy    Right thyroid  nodule     Family History  Problem  Relation Age of Onset   Cancer Mother        breast   Kidney failure Father    Cancer Maternal Grandmother        colon   Colon cancer Maternal Grandmother    Cancer Paternal Grandmother        ovarian   Cancer Sister        melanoma    Social History:  reports that she has never smoked. She has never used smokeless tobacco. She reports that she does not drink alcohol and does not use drugs.   Medications Prior to Admission  Medication Sig Dispense Refill Last Dose/Taking   azelastine (ASTELIN) 0.1 % nasal spray Place 2 sprays into both nostrils 2 (two) times daily.   10/09/2023 Bedtime   b complex vitamins capsule Take 1 capsule by mouth daily.   10/08/2023   Calcium Carb-Cholecalciferol (CALCIUM 600 + D PO) Take 1 tablet by mouth in the morning and at bedtime.    10/08/2023   cetirizine (ZYRTEC) 10 MG tablet Take 10 mg by mouth daily.   10/08/2023   clobetasol (TEMOVATE) 0.05 % external solution Apply 1 Application topically 2 (two) times daily as needed (dry skin).   Past Month   diphenhydramine -acetaminophen  (TYLENOL  PM) 25-500 MG TABS tablet Take 1 tablet by mouth as needed (sleep).    Past Month   FARXIGA 5 MG TABS tablet Take 5 mg by mouth daily.   10/08/2023   hydrochlorothiazide (HYDRODIURIL) 12.5 MG tablet Take 1 tablet by mouth daily.   10/08/2023  meclizine (ANTIVERT) 12.5 MG tablet Take 12.5 mg by mouth 3 (three) times daily as needed for dizziness.   10/10/2023 Morning   Multiple Vitamins-Calcium (ONE-A-DAY WOMENS PO) Take 1 tablet by mouth daily.    10/08/2023   ondansetron  (ZOFRAN -ODT) 8 MG disintegrating tablet Take 8 mg by mouth every 8 (eight) hours as needed for nausea or vomiting.   10/10/2023 Morning   pravastatin (PRAVACHOL) 20 MG tablet Take 20 mg by mouth at bedtime.   10/08/2023   venlafaxine  (EFFEXOR ) 37.5 MG tablet TAKE (1) TABLET BY MOUTH THREE TIMES DAILY WITH FOOD. (Patient taking differently: Take 37.5 mg by mouth daily.) 90 tablet 5 10/08/2023   vitamin B-12  (CYANOCOBALAMIN ) 500 MCG tablet Take 500 mcg by mouth daily.   10/08/2023      Exam: Current vital signs: BP 138/73 (BP Location: Left Arm)   Pulse 71   Temp 98.2 F (36.8 C) (Oral)   Resp 18   Ht 5\' 3"  (1.6 m)   Wt 79.4 kg   SpO2 100%   BMI 31.01 kg/m  Vital signs in last 24 hours: Temp:  [98.2 F (36.8 C)-99 F (37.2 C)] 98.2 F (36.8 C) (05/15 0918) Pulse Rate:  [62-76] 71 (05/15 0918) Resp:  [16-18] 18 (05/15 0918) BP: (116-159)/(70-91) 138/73 (05/15 0918) SpO2:  [94 %-100 %] 100 % (05/15 0918) Weight:  [79.4 kg] 79.4 kg (05/14 1648)   Physical Exam  Constitutional: Appears well-developed and well-nourished.  Psych: Affect appropriate to situation Neuro: AO x 3, no aphasia, cranial nerves grossly intact, antigravity send without drift in upper extremities, sensation intact to light touch, FTN intact bilaterally  NIHSS-0  I have reviewed labs in epic and the results pertinent to this consultation are: CBC:  Recent Labs  Lab 10/10/23 1220 10/10/23 1226  WBC 9.3  --   NEUTROABS 5.5  --   HGB 14.9 14.6  HCT 41.0 43.0  MCV 96.5  --   PLT 272  --     Basic Metabolic Panel:  Lab Results  Component Value Date   NA 137 10/10/2023   K 3.2 (L) 10/10/2023   CO2 23 10/10/2023   GLUCOSE 145 (H) 10/10/2023   BUN 23 10/10/2023   CREATININE 1.00 10/10/2023   CALCIUM 9.7 10/10/2023   GFRNONAA >60 10/10/2023   GFRAA >60 04/07/2019   Lipid Panel:  Lab Results  Component Value Date   LDLCALC 104 (H) 10/11/2023   HgbA1c:  Lab Results  Component Value Date   HGBA1C 6.8 (H) 10/10/2023   Urine Drug Screen: No results found for: "LABOPIA", "COCAINSCRNUR", "LABBENZ", "AMPHETMU", "THCU", "LABBARB"  Alcohol Level     Component Value Date/Time   ETH <15 10/10/2023 1600     I have reviewed the images obtained:  MRI brain with and without contrast 10/10/2023: Punctate focus of cortical restricted diffusion at the right parietooccipital junction (series 5, image  30). There is no corresponding enhancement at this site and this is consistent withan acute infarct.Tiny chronic cortical infarct within the right parietal lobe (series 11, image 18). Possible small chronic cortical infarct within the left parietal lobe (versus artifact) (series 11, image 22). Several small foci of T2 FLAIR hyperintense signal abnormality scattered within the bilateral cerebral white matter, nonspecific but compatible with changes of chronic small vessel ischemia. Small chronic infarcts within the bilateral cerebellar hemispheres.  CTA head and neck with and without contrast 10/10/2023: No large vessel occlusion. 1.5 mm outpouching along the right supraclinoid ICA at the origin  of the posterior communicating artery favored to reflect an infundibular origin versus less likely small aneurysm. Mild chronic microvascular ischemic changes. Small remote infarcts noted in the left cerebellum.     ASSESSMENT/PLAN: 63 year old female who presented with dizziness.  MRI brain incidentally showed stroke.  Therefore neurology was consulted.  Acute ischemic stroke (incidental) - Patient symptoms are most likely not related to the stroke.  Etiology: Likely small vessel disease.  She does have other small strokes in different vascular territories therefore could be cardioembolic  Recommendations: - Aspirin 81 mg and Plavix 75 mg daily for 3 weeks followed by aspirin 81 mg daily - Atorvastatin 40 mg daily for goal LDL less than 70 - TTE ordered and pending.  If negative for thrombus, recommend 30-day cardiac monitor to look for paroxysmal A-fib - Goal blood pressure: Normotension - Defer further management of dizziness to primary team - PT/OT - Follow-up with neurology in 2 to 3 months (order placed) - Discussed plan with patient -Discussed plan with Dr. Quintella Buck via secure chat    Thank you for allowing us  to participate in the care of this patient. If you have any further questions, please  contact  me or neurohospitalist.   Roxy Cordial [facility] neuro[facility]ist

## 2023-10-11 NOTE — Plan of Care (Signed)

## 2023-10-11 NOTE — Progress Notes (Signed)
*  PRELIMINARY RESULTS* Echocardiogram 2D Echocardiogram has been performed.  Bernis Brisker 10/11/2023, 1:32 PM

## 2023-10-11 NOTE — Evaluation (Signed)
 Occupational Therapy Evaluation Patient Details Name: Vanessa Neal MRN: [id number] DOB: 12/24/1960 Today's Date: 10/11/2023   History of Present Illness   Vanessa Neal is a 63 y.o. female with medical history significant for breast cancer, microcytic colitis.  Patient presented to the ED with complaints of dizziness started 3 weeks ago.  She went to an urgent care, she was given meclizine, was also diagnosed with middle ear effusions.  This helped with the dizziness.  She was also given nasal sprays and Zofran .  She reports yesterday symptoms returned and were very severe, with headache, and vomiting.  Reports dizziness when lying down and standing up.  No weakness of her extremity, no facial asymmetry, no change in speech.  No prior strokes. (Per MD)     Clinical Impressions Pt agreeable to OT and PT co-evaluation. Pt apperas to be at or near baseline function. Independent for ADL's and ambulation without AD per observation today. Good B UE and not new vision issues noted. Pt is not recommended for further acute OT services and will be discharged to care of nursing staff for remaining length of stay.       Functional Status Assessment   Patient has not had a recent decline in their functional status     Equipment Recommendations   None recommended by OT             Precautions/Restrictions   Precautions Precautions: None Restrictions Weight Bearing Restrictions Per Provider Order: No     Mobility Bed Mobility Overal bed mobility: Independent                  Transfers Overall transfer level: Independent                        Balance Overall balance assessment: Independent                                         ADL either performed or assessed with clinical judgement   ADL Overall ADL's : Independent                                             Vision Baseline Vision/History: 1 Wears glasses Ability to  See in Adequate Light: 1 Impaired Patient Visual Report: No change from baseline Vision Assessment?: No apparent visual deficits     Perception Perception: Not tested       Praxis Praxis: Not tested       Pertinent Vitals/Pain Pain Assessment Pain Assessment: Faces Faces Pain Scale: Hurts a little bit Pain Location: head Pain Descriptors / Indicators: Headache Pain Intervention(s): Monitored during session     Extremity/Trunk Assessment Upper Extremity Assessment Upper Extremity Assessment: Overall WFL for tasks assessed   Lower Extremity Assessment Lower Extremity Assessment: Defer to PT evaluation   Cervical / Trunk Assessment Cervical / Trunk Assessment: Normal   Communication Communication Communication: No apparent difficulties   Cognition Arousal: Alert Behavior During Therapy: WFL for tasks assessed/performed Cognition: No apparent impairments                               Following commands: Intact       Cueing  General Comments  Cueing Techniques: Verbal cues                 Home Living Family/patient expects to be discharged to:: Private residence Living Arrangements: Spouse/significant other Available Help at Discharge: Family;Available 24 hours/day Type of Home: House Home Access: Stairs to enter [employer] of Steps: 5 Entrance Stairs-Rails: Can reach both Home Layout: One level     Bathroom Shower/Tub: Walk-in shower (4 inch lip)   Bathroom Toilet: Handicapped height Bathroom Accessibility: Yes How Accessible: Accessible via walker Home Equipment: Cane - quad;Cane - single point          Prior Functioning/Environment Prior Level of Function : Independent/Modified Independent                                            Co-evaluation PT/OT/SLP Co-Evaluation/Treatment: Yes Reason for Co-Treatment: To address functional/ADL transfers   OT goals addressed during session: ADL's and  self-care                       End of Session    Activity Tolerance: Patient tolerated treatment well Patient left: in bed;with call bell/phone within reach  OT Visit Diagnosis: Other symptoms and signs involving the nervous system (F62.130)                Time: 8657-8469 OT Time Calculation (min): 10 min Charges:  OT General Charges $OT Visit: 1 Visit OT Evaluation $OT Eval Low Complexity: 1 Low  Preslynn Bier OT, MOT  Thurnell Floss 10/11/2023, 9:33 AM

## 2023-10-11 NOTE — Discharge Summary (Signed)
 Vanessa Neal, is a 63 y.o. female  DOB 1961/01/31  MRN [id number].  Admission date:  10/10/2023  Admitting Physician  Marlow Berenguer Quintella Buck, MD  Discharge Date:  10/11/2023   Primary MD  Wendi Ham, NP  Recommendations for primary care physician for things to follow:   1)Please take Aspirin  81 mg daily along with Plavix  75 mg daily for 21 days then after that STOP the Plavix   and continue ONLY Aspirin  81 mg daily indefinitely--   2)Avoid ibuprofen /Advil /Aleve/Motrin Juluis Ok Powders/Naproxen/BC powders/Meloxicam/Diclofenac/Indomethacin and other Nonsteroidal anti-inflammatory medications as these will make you more likely to bleed and can cause stomach ulcers, can also cause Kidney problems.   3)You will get a 30 day Heart Monitor in the mail within the next 7 business days--- please follow the instructions on the package  4) outpatient neurology follow-up has been scheduled for you  Admission Diagnosis  Acute CVA (cerebrovascular accident) (HCC) [I63.9] Stroke [facility]) [I63.9]   Discharge Diagnosis  Acute CVA (cerebrovascular accident) (HCC) [I63.9] Stroke [facility]) [I63.9]    Principal Problem:   Acute CVA (cerebrovascular accident) (HCC) Active Problems:   Hypokalemia   History of breast cancer   Lymphocytic colitis   HTN (hypertension)   Stroke [facility])      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) 01/04/2011   Ovarian cyst    Personal history of chemotherapy    Personal history of radiation therapy    Right thyroid  nodule     Past Surgical History:  Procedure Laterality Date   ABDOMINAL HYSTERECTOMY N/A 06/16/2014   Procedure: HYSTERECTOMY ABDOMINAL;  Surgeon: Albino Hum, MD;  Location: [facility] ORS;  Service: Gynecology;   Laterality: N/A;   BIOPSY  11/07/2019   Procedure: BIOPSY;  Surgeon: Suzette Espy, MD;  Location: [facility] ENDO SUITE;  Service: Endoscopy;;   BREAST CYST ASPIRATION  01/2012   BREAST CYST EXCISION     BREAST LUMPECTOMY     right   CESAREAN SECTION     X 2   CHOLECYSTECTOMY     COLONOSCOPY  03/11/2012   Procedure: COLONOSCOPY;  Surgeon: Alyce Jubilee, MD;  Location: [facility] ENDO SU[facility];  Service: Endoscopy;  Laterality: N/A;  9:30 AM-changed to 9:50 Doris notified   COLONOSCOPY N/A 11/07/2019   Normal colon and TI. Non-bleeding internal hemorrhoids. S/p biopsy. Lymphocytic colitis. Prescribed budesonide .    LIPOMA EXCISION     from back   SALPINGOOPHORECTOMY Bilateral 06/16/2014   Procedure: BILATERAL SALPINGO OOPHORECTOMY;  Surgeon: Albino Hum, MD;  Location: [facility] ORS;  Service: Gynecology;  Laterality: Bilateral;   SCAR REVISION N/A 06/16/2014   Procedure: SCAR REVISION;  Surgeon: Albino Hum, MD;  Location: [facility] ORS;  Service: Gynecology;  Laterality: N/A;     HPI  from the history and physical done on the day of admission:  Chief Complaint: Dizziness   HPI: Vanessa Neal is a 63 y.o. female with medical history significant for breast cancer, microcytic colitis. Patient presented to the ED with complaints of dizziness started 3 weeks ago.  She went to an urgent care, she was given meclizine , was also diagnosed with middle ear effusions.  This helped with the dizziness.  She was also given nasal sprays and Zofran .  She reports yesterday symptoms returned and were very severe, with headache, and vomiting.  Reports dizziness when lying down and standing up. No weakness of her extremity, no facial asymmetry, no change in speech.  No prior strokes.   ED Course: Temperature 99.  Heart rate 60s to 70s.  Blood pressure 116-149.  O2 sat greater than 98% on room air. Potassium 3.2. MRI showed -punctate acute cortical infarct at the right parieto-occipital junction.  Also tiny chronic cortical  infarct in the right parietal lobe, and small chronic infarcts within the bilateral cerebellar hemispheres. EDP talked to Dr. Renaee Caro, recommended admission here, aspirin  Plavix , CTA head and neck.   Review of Systems: As per HPI all other systems reviewed and negative.     Hospital Course:   Assessment and Plan: 1)Acute CVA (cerebrovascular accident) [facility]) MRI Brain shows--Punctate acute cortical infarct at the right parietooccipital Junction,  No evidence of intracranial metastatic disease, Tiny chronic cortical infarct within the right parietal lobe, Possible small chronic cortical infarct within the left parietal lobe (versus artifact) with Small chronic infarcts within the bilateral cerebellar hemispheres. Presenting with dizziness.  Initially started 3 weeks prior to admission, improved with meclizine , got worse 24 hours prior to admission,   --other etiology for her dizziness or contributing- ??BPPV, vestibular neuronitis. -Echo with EF of 60 to 65%, no mitral stenosis, no aortic stenosis --MRI brain, CTA head and neck without LVO - Neuroconsult appreciated   take Aspirin  81 mg daily along with Plavix  75 mg daily for 21 days then after that STOP the Plavix   and continue ONLY Aspirin  81 mg daily indefinitely--for secondary stroke Prevention (Per The multicenter SAMMPRIS trial) -- Atorvastatin  as prescribed -- Outpatient neuro follow-up as advised - 30-day heart monitor advised to look for arrhythmias  2)Hypokalemia Due to HCTZ use and chronic loose stools - c/n  potassium supplementation while on HCTZ    3)HTN (hypertension) -Resume HCTZ 12.5 mg daily, take potassium supplement along with  4)Recent URI/ear congestion and vertiginous type symptoms--- continue azelastine nasal spray,  - May use as needed meclizine   Lymphocytic colitis Stable.  Chronic unchanged loose stools.  History of breast cancer Follows with [place].  2011- History of stage IIa right-sided breast  cancer, underwent chemotherapy, radiation therapy and lumpectomy.  Under surveillance. - Brain MRI without brain metastasis  Discharge Condition: stable  Follow UP--- neurology as outpatient   Consults obtained -neurology  Diet and Activity recommendation:  As advised  Discharge Instructions    Discharge Instructions     Ambulatory referral to Neurology   Complete by: As directed    An appointment is requested in approximately: 8 weeks   Call MD for:  difficulty breathing, headache or visual disturbances   Complete by: As directed    Call MD for:  persistant dizziness or light-headedness   Complete by: As directed    Call MD for:  persistant nausea and vomiting   Complete by: As directed    Call MD for:  temperature >100.4   Complete by: As directed    Diet - low sodium heart  healthy   Complete by: As directed    Discharge instructions   Complete by: As directed    1)Please take Aspirin  81 mg daily along with Plavix  75 mg daily for 21 days then after that STOP the Plavix   and continue ONLY Aspirin  81 mg daily indefinitely--   2)Avoid ibuprofen /Advil /Aleve/Motrin /Goody Powders/Naproxen/BC powders/Meloxicam/Diclofenac/Indomethacin and other Nonsteroidal anti-inflammatory medications as these will make you more likely to bleed and can cause stomach ulcers, can also cause Kidney problems.   3)You will get a 30 day Heart Monitor in the mail within the next 7 business days--- please follow the instructions on the package  4) outpatient neurology follow-up has been scheduled for you   Increase activity slowly   Complete by: As directed          Discharge Medications     Allergies as of 10/11/2023       Reactions   Banana Nausea Only   Kiwi Extract Nausea Only        Medication List     STOP taking these medications    pravastatin 20 MG tablet Commonly known as: PRAVACHOL       TAKE these medications    aspirin  EC 81 MG tablet Take 1 tablet (81 mg total)  by mouth daily with breakfast. Swallow whole.   atorvastatin  40 MG tablet Commonly known as: LIPITOR Take 1 tablet (40 mg total) by mouth daily. Start taking on: Oct 12, 2023   azelastine 0.1 % nasal spray Commonly known as: ASTELIN Place 2 sprays into both nostrils 2 (two) times daily.   b complex vitamins capsule Take 1 capsule by mouth daily.   CALCIUM  600 + D PO Take 1 tablet by mouth in the morning and at bedtime.   cetirizine 10 MG tablet Commonly known as: ZYRTEC Take 10 mg by mouth daily.   clobetasol 0.05 % external solution Commonly known as: TEMOVATE Apply 1 Application topically 2 (two) times daily as needed (dry skin).   clopidogrel  75 MG tablet Commonly known as: PLAVIX  Take 1 tablet (75 mg total) by mouth daily. take Aspirin  81 mg daily along with Plavix  75 mg daily for 21 days then after that STOP the Plavix   and continue ONLY Aspirin  81 mg daily indefinitely--   diphenhydramine -acetaminophen  25-500 MG Tabs tablet Commonly known as: TYLENOL  PM Take 1 tablet by mouth as needed (sleep).   Farxiga 5 MG Tabs tablet Generic drug: dapagliflozin propanediol Take 5 mg by mouth daily.   hydrochlorothiazide 12.5 MG tablet Commonly known as: HYDRODIURIL Take 1 tablet by mouth daily.   meclizine  12.5 MG tablet Commonly known as: ANTIVERT  Take 12.5 mg by mouth 3 (three) times daily as needed for dizziness.   ondansetron  8 MG disintegrating tablet Commonly known as: ZOFRAN -ODT Take 8 mg by mouth every 8 (eight) hours as needed for nausea or vomiting.   ONE-A-DAY WOMENS PO Take 1 tablet by mouth daily.   potassium chloride  10 MEQ tablet Commonly known as: KLOR-CON  Take 1 tablet (10 mEq total) by mouth daily. Take While taking Lasix /furosemide    venlafaxine  37.5 MG tablet Commonly known as: EFFEXOR  TAKE (1) TABLET BY MOUTH THREE TIMES DAILY WITH FOOD. What changed: See the new instructions.   vitamin B-12 500 MCG tablet Commonly known as:  CYANOCOBALAMIN  Take 500 mcg by mouth daily.        Major procedures and Radiology Reports - PLEASE review detailed and final reports for all details, in brief -   ECHOCARDIOGRAM COMPLETE Result Date: 10/11/2023  ECHOCARDIOGRAM REPORT   Patient Name:   DAHNA HATTABAUGH Date of Exam: 10/11/2023 Medical Rec #:  [id number]    Height:       63.0 in Accession #:    [id number]   Weight:       175.0 lb Date of Birth:  1961/03/25     BSA:          1.827 m Patient Age:    62 years     BP:           138/73 mmHg Patient Gender: F            HR:           71 bpm. Exam Location:  [facility] Procedure: 2D Echo, Cardiac Doppler and Color Doppler (Both Spectral and Color            Flow Doppler were utilized during procedure). Indications:    Stroke I63.9  History:        Patient has prior history of Echocardiogram examinations. Risk                 Factors:Hypertension. Hx of breast cancer.  Sonographer:    Denese Finn RCS Referring Phys: [phone] EJIROGHENE E Schuyler Olden IMPRESSIONS  1. Left ventricular ejection fraction, by estimation, is 60 to 65%. The left ventricle has normal function. The left ventricle has no regional wall motion abnormalities. Left ventricular diastolic parameters were normal.  2. Right ventricular systolic function is normal. The right ventricular size is normal.  3. The mitral valve is normal in structure. No evidence of mitral valve regurgitation. No evidence of mitral stenosis.  4. The aortic valve is tricuspid. Aortic valve regurgitation is not visualized. No aortic stenosis is present.  5. The inferior vena cava is normal in size with greater than 50% respiratory variability, suggesting right atrial pressure of 3 mmHg. FINDINGS  Left Ventricle: Left ventricular ejection fraction, by estimation, is 60 to 65%. The left ventricle has normal function. The left ventricle has no regional wall motion abnormalities. The left ventricular internal cavity size was normal in size. There is  no left ventricular  hypertrophy. Left ventricular diastolic parameters were normal. Right Ventricle: The right ventricular size is normal. Right vetricular wall thickness was not well visualized. Right ventricular systolic function is normal. Left Atrium: Left atrial size was normal in size. Right Atrium: Right atrial size was normal in size. Pericardium: The pericardium was not well visualized. Mitral Valve: The mitral valve is normal in structure. There is mild thickening of the mitral valve leaflet(s). There is mild calcification of the mitral valve leaflet(s). Mild mitral annular calcification. No evidence of mitral valve regurgitation. No evidence of mitral valve stenosis. Tricuspid Valve: The tricuspid valve is normal in structure. Tricuspid valve regurgitation is not demonstrated. No evidence of tricuspid stenosis. Aortic Valve: The aortic valve is tricuspid. Aortic valve regurgitation is not visualized. No aortic stenosis is present. Aortic valve mean gradient measures 3.0 mmHg. Aortic valve peak gradient measures 5.9 mmHg. Aortic valve area, by VTI measures 3.38 cm. Pulmonic Valve: The pulmonic valve was not well visualized. Pulmonic valve regurgitation is not visualized. No evidence of pulmonic stenosis. Aorta: The aortic root is normal in size and structure. Venous: The inferior vena cava is normal in size with greater than 50% respiratory variability, suggesting right atrial pressure of 3 mmHg. IAS/Shunts: No atrial level shunt detected by color flow Doppler.  LEFT VENTRICLE PLAX 2D LVIDd:  4.30 cm   Diastology LVIDs:         2.70 cm   LV e' medial:    9.90 cm/s LV PW:         0.70 cm   LV E/e' medial:  10.4 LV IVS:        0.80 cm   LV e' lateral:   8.81 cm/s LVOT diam:     2.00 cm   LV E/e' lateral: 11.7 LV SV:         93 LV SV Index:   51 LVOT Area:     3.14 cm  RIGHT VENTRICLE RV S prime:     14.70 cm/s TAPSE (M-mode): 2.7 cm LEFT ATRIUM             Index        RIGHT ATRIUM           Index LA diam:        3.60  cm 1.97 cm/m   RA Area:     15.50 cm LA Vol (A2C):   70.1 ml 38.37 ml/m  RA Volume:   42.40 ml  23.21 ml/m LA Vol (A4C):   61.6 ml 33.71 ml/m LA Biplane Vol: 67.0 ml 36.67 ml/m  AORTIC VALVE AV Area (Vmax):    3.58 cm AV Area (Vmean):   3.28 cm AV Area (VTI):     3.38 cm AV Vmax:           121.00 cm/s AV Vmean:          86.700 cm/s AV VTI:            0.274 m AV Peak Grad:      5.9 mmHg AV Mean Grad:      3.0 mmHg LVOT Vmax:         138.00 cm/s LVOT Vmean:        90.500 cm/s LVOT VTI:          0.295 m LVOT/AV VTI ratio: 1.08  AORTA Ao Root diam: 3.10 cm MITRAL VALVE MV Area (PHT): 4.06 cm     SHUNTS MV Decel Time: 187 msec     Systemic VTI:  0.30 m MR Peak grad: 101.6 mmHg    Systemic Diam: 2.00 cm MR Mean grad: 63.0 mmHg MR Vmax:      504.00 cm/s MR Vmean:     372.0 cm/s MV E velocity: 103.00 cm/s MV A velocity: 87.00 cm/s MV E/A ratio:  1.18 Armida Lander MD Electronically signed by Armida Lander MD Signature Date/Time: 10/11/2023/1:45:14 PM    Final    CT ANGIO HEAD NECK W WO CM Result Date: 10/10/2023 CLINICAL DATA:  Acute stroke, presenting with dizziness. Acute cortical infarct in the right parieto-occipital lobes on MRI. EXAM: CT ANGIOGRAPHY HEAD AND NECK WITH AND WITHOUT CONTRAST TECHNIQUE: Multidetector CT imaging of the head and neck was performed using the standard protocol during bolus administration of intravenous contrast. Multiplanar CT image reconstructions and MIPs were obtained to evaluate the vascular anatomy. Carotid stenosis measurements (when applicable) are obtained utilizing NASCET criteria, using the distal internal carotid diameter as the denominator. RADIATION DOSE REDUCTION: This exam was performed according to the departmental dose-optimization program which includes automated exposure control, adjustment of the mA and/or kV according to patient size and/or use of iterative reconstruction technique. CONTRAST:  75mL OMNIPAQUE  IOHEXOL  350 MG/ML SOLN COMPARISON:  Earlier same  day MRI head. FINDINGS: CT HEAD FINDINGS Brain: No acute intracranial hemorrhage. No CT evidence of acute  infarct. Nonspecific hypoattenuation in the periventricular and subcortical white matter favored to reflect chronic microvascular ischemic changes. Remote infarcts in the left cerebellar hemisphere noted. No edema, mass effect, or midline shift. The basilar cisterns are patent. Ventricles: Ventricles are normal in size and configuration. Vascular: No hyperdense vessel. Skull: No acute or aggressive finding. Sinuses/orbits: The visualized paranasal sinuses are clear. Orbits are symmetric. Other: Mastoid air cells are clear. CTA NECK FINDINGS Aortic arch: Standard configuration of the aortic arch. Imaged portion shows no evidence of aneurysm or dissection. No significant stenosis of the major arch vessel origins. Pulmonary arteries: As permitted by contrast timing, there are no filling defects in the visualized pulmonary arteries. Subclavian arteries: The subclavian arteries are patent bilaterally. Right carotid system: No evidence of dissection, stenosis (50% or greater), or occlusion. Left carotid system: No evidence of dissection, stenosis (50% or greater), or occlusion. Vertebral arteries: Codominant. No evidence of dissection, stenosis (50% or greater), or occlusion. Skeleton: No acute or aggressive finding noted. Other neck: The visualized airway is patent. No cervical lymphadenopathy. Subcentimeter nodule with peripheral calcification in the right thyroid  lobe. Upper chest: Visualized lung apices are clear. Review of the MIP images confirms the above findings CTA HEAD FINDINGS ANTERIOR CIRCULATION: The intracranial ICAs are patent bilaterally. 1.5 mm outpouching along the right supraclinoid ICA at the origin of the right posterior communicating artery likely reflecting infundibular origin of the vessel versus less likely small aneurysm. No significant stenosis, proximal occlusion, or vascular malformation.  MCAs: The middle cerebral arteries are patent bilaterally. ACAs: The anterior cerebral arteries are patent bilaterally. POSTERIOR CIRCULATION: No significant stenosis, proximal occlusion, aneurysm, or vascular malformation. PCAs: The posterior cerebral arteries are patent bilaterally. Pcomm: The posterior communicating arteries are visualized bilaterally. SCAs: The superior cerebellar arteries are patent bilaterally. Basilar artery: Patent AICAs: Patent PICAs: Visualized on the left. Vertebral arteries: The intracranial vertebral arteries are patent. Venous sinuses: As permitted by contrast timing, patent. Anatomic variants: None Review of the MIP images confirms the above findings IMPRESSION: No CT evidence of acute intracranial abnormality. Punctate infarct noted on MRI is not well visualized on the current study. No large vessel occlusion. 1.5 mm outpouching along the right supraclinoid ICA at the origin of the posterior communicating artery favored to reflect an infundibular origin versus less likely small aneurysm. Mild chronic microvascular ischemic changes. Small remote infarcts noted in the left cerebellum. Electronically Signed   By: Denny Flack M.D.   On: 10/10/2023 18:09   MR Brain W and Wo Contrast Result Date: 10/10/2023 CLINICAL DATA:  Provided history: Neuro deficit, acute, stroke suspected. Dizziness. History of breast cancer. EXAM: MRI HEAD WITHOUT AND WITH CONTRAST TECHNIQUE: Multiplanar, multiecho pulse sequences of the brain and surrounding structures were obtained without and with intravenous contrast. CONTRAST:  7mL GADAVIST  GADOBUTROL  1 MMOL/ML IV SOLN COMPARISON:  None. FINDINGS: Brain: No age-advanced or lobar predominant cerebral atrophy. Punctate focus of cortical restricted diffusion at the right parietooccipital junction (series 5, image 30). There is no corresponding enhancement at this site and this is consistent with an acute infarct. Tiny chronic cortical infarct within the  right parietal lobe (series 11, image 18). Possible small chronic cortical infarct within the left parietal lobe (versus artifact) (series 11, image 22). Several small foci of T2 FLAIR hyperintense signal abnormality scattered within the bilateral cerebral white matter, nonspecific but compatible with changes of chronic small vessel ischemia. Small chronic infarcts within the bilateral cerebellar hemispheres. No cortical encephalomalacia is identified. No evidence of an  intracranial mass. No chronic intracranial blood products. No extra-axial fluid collection. No midline shift. No pathologic intracranial enhancement identified. Vascular: Maintained flow voids within the proximal large arterial vessels. Skull and upper cervical spine: No focal worrisome marrow lesion. Sinuses/Orbits: No mass or acute finding within the imaged orbits. Susceptibility artifact partially obscures the left maxillary sinus. No significant paranasal sinus disease is visible. IMPRESSION: 1. Punctate acute cortical infarct at the right parietooccipital junction. 2. No evidence of intracranial metastatic disease. 3. Tiny chronic cortical infarct within the right parietal lobe. 4. Possible small chronic cortical infarct within the left parietal lobe (versus artifact). 5. Mild chronic small vessel ischemic changes within the cerebral white matter. 6. Small chronic infarcts within the bilateral cerebellar hemispheres. Electronically Signed   By: Bascom Lily D.O.   On: 10/10/2023 14:27   Today   Subjective    Yalitza Teed today has no new complaints         Husband at bedside, questions answered No fever  Or chills   Patient has been seen and examined prior to discharge   Objective   Blood pressure 137/75, pulse 65, temperature 98.2 F (36.8 C), temperature source Oral, resp. rate 17, height 5\' 3"  (1.6 m), weight 79.4 kg, SpO2 95%.   Intake/Output Summary (Last 24 hours) at 10/11/2023 1811 Last data filed at 10/11/2023  0523 Gross per 24 hour  Intake 1198.21 ml  Output --  Net 1198.21 ml   Exam Gen:- Awake Alert, no acute distress  HEENT:- [place].AT, No sclera icterus Neck-Supple Neck,No JVD,.  Lungs-  CTAB , good air movement bilaterally CV- S1, S2 normal, regular Abd-  +ve B.Sounds, Abd Soft, No tenderness,    Extremity/Skin:- No  edema,   good pulses Psych-affect is appropriate, oriented x3 Neuro-improved vertigo, no additional new focal deficits, no tremors    Data Review   CBC w Diff:  Lab Results  Component Value Date   WBC 9.3 10/10/2023   HGB 14.6 10/10/2023   HGB 13.0 12/12/2017   HCT 43.0 10/10/2023   HCT 37.0 12/12/2017   PLT 272 10/10/2023   PLT 282 12/12/2017   LYMPHOPCT 34 10/10/2023   MONOPCT 5 10/10/2023   EOSPCT 2 10/10/2023   BASOPCT 0 10/10/2023    CMP:  Lab Results  Component Value Date   NA 137 10/10/2023   NA 138 12/12/2017   K 3.2 (L) 10/10/2023   CL 99 10/10/2023   CO2 23 10/10/2023   BUN 23 10/10/2023   BUN 15 12/12/2017   CREATININE 1.00 10/10/2023   CREATININE 0.76 04/29/2014   PROT 7.7 04/24/2022   PROT 7.4 12/12/2017   ALBUMIN 4.3 04/24/2022   ALBUMIN 4.2 12/12/2017   BILITOT 0.6 04/24/2022   BILITOT 0.4 12/12/2017   ALKPHOS 44 04/24/2022   AST 28 04/24/2022   ALT 29 04/24/2022  .  Total Discharge time is about 33 minutes  Colin Dawley M.D on 10/11/2023 at 6:11 PM  Go to [email or web address] -  for contact info  [facility] - Office  [phone]

## 2023-10-11 NOTE — Progress Notes (Signed)
 Has showed no stroke deficits during night.  C/O headache rated a 3 but no dizziness.  Has been ambulating to bathroom independently

## 2023-10-12 ENCOUNTER — Telehealth: Payer: Self-pay

## 2023-10-12 DIAGNOSIS — I639 Cerebral infarction, unspecified: Secondary | ICD-10-CM

## 2023-10-12 NOTE — Telephone Encounter (Signed)
-----   Message from Colin Dawley sent at 10/11/2023  5:57 PM EDT ----- Regarding: 30 day heart Monitor --Stroke patient Please mail 30 day heart Monitor --Stroke patient  Thank you Colin Dawley, MD

## 2023-10-12 NOTE — Telephone Encounter (Signed)
 Monitor order placed. To be mailed to patient.

## 2023-10-15 ENCOUNTER — Encounter (HOSPITAL_COMMUNITY): Payer: Self-pay | Admitting: Hematology & Oncology

## 2023-10-17 DIAGNOSIS — Z8673 Personal history of transient ischemic attack (TIA), and cerebral infarction without residual deficits: Secondary | ICD-10-CM | POA: Diagnosis not present

## 2023-10-17 DIAGNOSIS — E785 Hyperlipidemia, unspecified: Secondary | ICD-10-CM | POA: Diagnosis not present

## 2023-10-17 DIAGNOSIS — I10 Essential (primary) hypertension: Secondary | ICD-10-CM | POA: Diagnosis not present

## 2023-10-17 DIAGNOSIS — E039 Hypothyroidism, unspecified: Secondary | ICD-10-CM | POA: Diagnosis not present

## 2023-10-17 DIAGNOSIS — R42 Dizziness and giddiness: Secondary | ICD-10-CM | POA: Diagnosis not present

## 2023-10-19 DIAGNOSIS — I498 Other specified cardiac arrhythmias: Secondary | ICD-10-CM | POA: Diagnosis not present

## 2023-10-26 ENCOUNTER — Ambulatory Visit: Attending: Cardiology

## 2023-10-26 DIAGNOSIS — I639 Cerebral infarction, unspecified: Secondary | ICD-10-CM

## 2023-11-06 DIAGNOSIS — R42 Dizziness and giddiness: Secondary | ICD-10-CM | POA: Diagnosis not present

## 2023-11-06 DIAGNOSIS — Z8673 Personal history of transient ischemic attack (TIA), and cerebral infarction without residual deficits: Secondary | ICD-10-CM | POA: Diagnosis not present

## 2023-11-09 DIAGNOSIS — R42 Dizziness and giddiness: Secondary | ICD-10-CM | POA: Diagnosis not present

## 2023-11-20 ENCOUNTER — Other Ambulatory Visit: Payer: Self-pay

## 2023-11-20 DIAGNOSIS — I639 Cerebral infarction, unspecified: Secondary | ICD-10-CM

## 2023-12-03 DIAGNOSIS — E039 Hypothyroidism, unspecified: Secondary | ICD-10-CM | POA: Diagnosis not present

## 2024-01-08 DIAGNOSIS — I498 Other specified cardiac arrhythmias: Secondary | ICD-10-CM | POA: Diagnosis not present

## 2024-01-09 ENCOUNTER — Encounter: Payer: Self-pay | Admitting: Neurology

## 2024-01-09 ENCOUNTER — Ambulatory Visit: Admitting: Neurology

## 2024-01-09 VITALS — BP 106/70 | HR 75 | Ht 65.0 in | Wt 173.5 lb

## 2024-01-09 DIAGNOSIS — H8110 Benign paroxysmal vertigo, unspecified ear: Secondary | ICD-10-CM

## 2024-01-09 DIAGNOSIS — I6381 Other cerebral infarction due to occlusion or stenosis of small artery: Secondary | ICD-10-CM | POA: Diagnosis not present

## 2024-01-09 NOTE — Patient Instructions (Signed)
 Continue current medications Continue with vestibular therapy tomorrow Continue follow-up PCP Return as needed

## 2024-01-09 NOTE — Progress Notes (Signed)
 [facility]  PATIENT: Vanessa Neal DOB: Jul 25, 1960  REQUESTING CLINICIAN: Hyacinth Honey, NP HISTORY FROM: Patient  REASON FOR VISIT: Stroke    HISTORICAL  CHIEF COMPLAINT:  Chief Complaint  Patient presents with   New Patient (Initial Visit)    [id number], husband present, internal referral for Acute CVA:pt stated that she's more fatigued    HISTORY OF PRESENT ILLNESS:  This is a 63 year old woman past medical history of hypertension, hyperlipidemia, history of breast cancer 13 years ago who is presenting after being diagnosed with a punctate right parietal occipital stroke.  Patient presented to the ED after 3 weeks of vertigo with difficulty walking, nausea and vomiting.  Found to have a tiny punctate stroke in the right parietal occipital region.  She was started on Aspirin  and Plavix  for 21 days, continued on aspirin  alone.  Her Lipitor was increased from 20 to 40 mg daily due to LDL of 104.  She also had a cardiac monitor, no arrhythmia, no signs of atrial fibrillation. Since discharge from the hospital, she does report some episode of tiredness, her TSH was elevated while in the hospital, patient was started on Synthroid and she is following up with PCP for additional testing.  She also reported additional episode of vertigo and dizziness, but no falls.  When it comes her vertigo symptoms, patient tells me prior to being diagnosed with stroke, she was having vertigo described as room spinning sensation, associated with nausea, vomiting and difficulty walking.  She will have vertigo symptoms lasting for few minutes, and worse with head movement mainly when turning to the right.   Hospital course and summary Vanessa Neal is a 63 y.o. female with medical history significant for breast cancer, microcytic colitis. Patient presented to the ED with complaints of dizziness started 3 weeks ago.  She went to an urgent care, she was given meclizine , was also diagnosed with  middle ear effusions.  This helped with the dizziness.  She was also given nasal sprays and Zofran .  She reports yesterday symptoms returned and were very severe, with headache, and vomiting.  Reports dizziness when lying down and standing up. No weakness of her extremity, no facial asymmetry, no change in speech.  No prior strokes.   ED Course: Temperature 99.  Heart rate 60s to 70s.  Blood pressure 116-149.  O2 sat greater than 98% on room air. Potassium 3.2. MRI showed -punctate acute cortical infarct at the right parieto-occipital junction.  Also tiny chronic cortical infarct in the right parietal lobe, and small chronic infarcts within the bilateral cerebellar hemispheres.  OTHER MEDICAL CONDITIONS: Hypertension, Hyperlipidemia, History of breast cancer   REVIEW OF SYSTEMS: Full 14 system review of systems performed and negative with exception of: As noted in the HPI   ALLERGIES: Allergies  Allergen Reactions   Banana Nausea Only   Kiwi Extract Nausea Only    HOME MEDICATIONS: Outpatient Medications Prior to Visit  Medication Sig Dispense Refill   aspirin  EC 81 MG tablet Take 1 tablet (81 mg total) by mouth daily with breakfast. Swallow whole. 100 tablet 3   atorvastatin  (LIPITOR) 40 MG tablet Take 1 tablet (40 mg total) by mouth daily. 30 tablet 11   azelastine (ASTELIN) 0.1 % nasal spray Place 2 sprays into both nostrils 2 (two) times daily. (Patient taking differently: Place 2 sprays into both nostrils as needed.)     b complex vitamins capsule Take 1 capsule by mouth daily.     Calcium  Carb-Cholecalciferol (CALCIUM  600 +  D PO) Take 1 tablet by mouth in the morning and at bedtime.      cetirizine (ZYRTEC) 10 MG tablet Take 10 mg by mouth daily.     clobetasol (TEMOVATE) 0.05 % external solution Apply 1 Application topically 2 (two) times daily as needed (dry skin).     diphenhydramine -acetaminophen  (TYLENOL  PM) 25-500 MG TABS tablet Take 1 tablet by mouth as needed (sleep).       FARXIGA 5 MG TABS tablet Take 5 mg by mouth daily.     hydrochlorothiazide (HYDRODIURIL) 12.5 MG tablet Take 1 tablet by mouth daily.     Multiple Vitamins-Calcium  (ONE-A-DAY WOMENS PO) Take 1 tablet by mouth daily.      venlafaxine  (EFFEXOR ) 37.5 MG tablet TAKE (1) TABLET BY MOUTH THREE TIMES DAILY WITH FOOD. 90 tablet 5   vitamin B-12 (CYANOCOBALAMIN ) 500 MCG tablet Take 500 mcg by mouth daily.     meclizine  (ANTIVERT ) 12.5 MG tablet Take 12.5 mg by mouth 3 (three) times daily as needed for dizziness.     ondansetron  (ZOFRAN -ODT) 8 MG disintegrating tablet Take 8 mg by mouth every 8 (eight) hours as needed for nausea or vomiting.     potassium chloride  (KLOR-CON ) 10 MEQ tablet Take 1 tablet (10 mEq total) by mouth daily. Take While taking Lasix /furosemide  30 tablet 2   No facility-administered medications prior to visit.    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) 01/04/2011   Ovarian cyst    Personal history of chemotherapy    Personal history of radiation therapy    Right thyroid  nodule     PAST SURGICAL HISTORY: Past Surgical History:  Procedure Laterality Date   ABDOMINAL HYSTERECTOMY N/A 06/16/2014   Procedure: HYSTERECTOMY ABDOMINAL;  Surgeon: Norleen Edsel GAILS, MD;  Location: [facility];  Service: Gynecology;  Laterality: N/A;   BIOPSY  11/07/2019   Procedure: BIOPSY;  Surgeon: Shaaron Lamar HERO, MD;  Location: [facility];  Service: Endoscopy;;   BREAST CYST ASPIRATION  01/2012   BREAST CYST EXCISION     BREAST LUMPECTOMY     right   CESAREAN SECTION     X 2   CHOLECYSTECTOMY     COLONOSCOPY  03/11/2012   Procedure: COLONOSCOPY;  Surgeon: Margo LITTIE Haddock, MD;  Location: [facility];  Service: Endoscopy;  Laterality: N/A;  9:30 AM-changed to 9:50 Doris notified   COLONOSCOPY  N/A 11/07/2019   Normal colon and TI. Non-bleeding internal hemorrhoids. S/p biopsy. Lymphocytic colitis. Prescribed budesonide .    LIPOMA EXCISION     from back   SALPINGOOPHORECTOMY Bilateral 06/16/2014   Procedure: BILATERAL SALPINGO OOPHORECTOMY;  Surgeon: Norleen Edsel GAILS, MD;  Location: [facility];  Service: Gynecology;  Laterality: Bilateral;   SCAR REVISION N/A 06/16/2014   Procedure: SCAR REVISION;  Surgeon: Norleen Edsel GAILS, MD;  Location: [facility];  Service: Gynecology;  Laterality: N/A;    FAMILY HISTORY: Family History  Problem Relation Age of Onset   Cancer Mother        breast   Kidney failure Father    Cancer Maternal Grandmother        colon   Colon cancer Maternal Grandmother    Cancer Paternal Grandmother        ovarian   Cancer Sister  melanoma    SOCIAL HISTORY: Social History   Socioeconomic History   Marital status: Married    Spouse name: Not on file   Number of children: Not on file   Years of education: Not on file   Highest education level: Not on file  Occupational History   Not on file  Tobacco Use   Smoking status: Never   Smokeless tobacco: Never  Vaping Use   Vaping status: Never Used  Substance and Sexual Activity   Alcohol use: No   Drug use: No   Sexual activity: Yes    Birth control/protection: Other-see comments, Surgical    Comment: Pt had chemo and no longer has a period.  Other Topics Concern   Not on file  Social History Narrative   Not on file   Social Drivers of Health   Financial Resource Strain: Not on file  Food Insecurity: No Food Insecurity (10/10/2023)   Hunger Vital Sign    Worried About Running Out of Food in the Last Year: Never true    Ran Out of Food in the Last Year: Never true  Transportation Needs: No Transportation Needs (10/10/2023)   PRAPARE - Administrator, Civil Service (Medical): No    Lack of Transportation (Non-Medical): No  Physical Activity: Not on file  Stress: Not on file  Social  Connections: Not on file  Intimate Partner Violence: Not At Risk (10/10/2023)   Humiliation, Afraid, Rape, and Kick questionnaire    Fear of Current or Ex-Partner: No    Emotionally Abused: No    Physically Abused: No    Sexually Abused: No     PHYSICAL EXAM  GENERAL EXAM/CONSTITUTIONAL: Vitals:  Vitals:   01/09/24 1453  BP: 106/70  Pulse: 75  Weight: 173 lb 8 oz (78.7 kg)  Height: 5' 5 (1.651 m)   Body mass index is 28.87 kg/m. Wt Readings from Last 3 Encounters:  01/09/24 173 lb 8 oz (78.7 kg)  10/10/23 175 lb 0.7 oz (79.4 kg)  05/01/22 179 lb 14.4 oz (81.6 kg)   Patient is in no distress; well developed, nourished and groomed; neck is supple  MUSCULOSKELETAL: Gait, strength, tone, movements noted in Neurologic exam below  NEUROLOGIC: MENTAL STATUS:      No data to display         awake, alert, oriented to person, place and time recent and remote memory intact normal attention and concentration language fluent, comprehension intact, naming intact fund of knowledge appropriate  CRANIAL NERVE:  2nd, 3rd, 4th, 6th - Visual fields full to confrontation, extraocular muscles intact, no nystagmus 5th - facial sensation symmetric 7th - facial strength symmetric 8th - hearing intact 9th - palate elevates symmetrically, uvula midline 11th - shoulder shrug symmetric 12th - tongue protrusion midline  MOTOR:  normal bulk and tone, full strength in the BUE, BLE  SENSORY:  normal and symmetric to light touch  COORDINATION:  finger-nose-finger, fine finger movements normal  GAIT/STATION:  Normal, able to tandem walk     DIAGNOSTIC DATA (LABS, IMAGING, TESTING) - I reviewed patient records, labs, notes, testing and imaging myself where available.  Lab Results  Component Value Date   WBC 9.3 10/10/2023   HGB 14.6 10/10/2023   HCT 43.0 10/10/2023   MCV 96.5 10/10/2023   PLT 272 10/10/2023      Component Value Date/Time   NA 137 10/10/2023 1226   NA  138 12/12/2017 0917   K 3.2 (L) 10/10/2023 1226  CL 99 10/10/2023 1226   CO2 23 10/10/2023 1224   GLUCOSE 145 (H) 10/10/2023 1226   BUN 23 10/10/2023 1226   BUN 15 12/12/2017 0917   CREATININE 1.00 10/10/2023 1226   CREATININE 0.76 04/29/2014 0919   CALCIUM  9.7 10/10/2023 1224   PROT 7.7 04/24/2022 1555   PROT 7.4 12/12/2017 0917   ALBUMIN 4.3 04/24/2022 1555   ALBUMIN 4.2 12/12/2017 0917   AST 28 04/24/2022 1555   ALT 29 04/24/2022 1555   ALKPHOS 44 04/24/2022 1555   BILITOT 0.6 04/24/2022 1555   BILITOT 0.4 12/12/2017 0917   GFRNONAA >60 10/10/2023 1224   GFRAA >60 04/07/2019 0818   Lab Results  Component Value Date   CHOL 179 10/11/2023   HDL 36 (L) 10/11/2023   LDLCALC 104 (H) 10/11/2023   TRIG 197 (H) 10/11/2023   CHOLHDL 5.0 10/11/2023   Lab Results  Component Value Date   HGBA1C 6.8 (H) 10/10/2023   Lab Results  Component Value Date   VITAMINB12 360 09/28/2014   Lab Results  Component Value Date   TSH 7.412 (H) 10/10/2023    MRI Brain 10/10/2023 1. Punctate acute cortical infarct at the right parieto-occipital junction. 2. No evidence of intracranial metastatic disease. 3. Tiny chronic cortical infarct within the right parietal lobe. 4. Possible small chronic cortical infarct within the left parietal lobe (versus artifact). 5. Mild chronic small vessel ischemic changes within the cerebral white matter. 6. Small chronic infarcts within the bilateral cerebellar hemispheres.   CTA Head and Neck 10/10/2023 No CT evidence of acute intracranial abnormality. Punctate infarct noted on MRI is not well visualized on the current study. No large vessel occlusion. 1.5 mm outpouching along the right supraclinoid ICA at the origin of the posterior communicating artery favored to reflect an infundibular origin versus less likely small aneurysm. Mild chronic microvascular ischemic changes. Small remote infarcts noted in the left cerebellum    ASSESSMENT AND PLAN  63  y.o. year old female with hypertension, hyperlipidemia, diabetes, history of breast cancer who is presenting following a tiny punctate stroke.  Stroke etiology likely small vessel disease.  I have informed patient that I do not suspect her initial symptoms of vertigo to be related to the tiny punctuate parieto-occipital stroke.  Advised her to follow-up with vestibular testing scheduled for tomorrow.  She already completed DAPT, for 21 days and currently on aspirin  alone.  Advised her to continue with aspirin , Lipitor and also to follow-up with PCP regarding thyroid  disease management.  I will continue to see her on a as needed basis.  She is comfortable with plans.   1. Cerebrovascular accident (CVA) due to occlusion of small artery (HCC)   2. Benign paroxysmal positional vertigo, unspecified laterality      Patient Instructions  Continue current medications Continue with vestibular therapy tomorrow Continue follow-up PCP Return as needed  No orders of the defined types were placed in this encounter.   No orders of the defined types were placed in this encounter.   Return if symptoms worsen or fail to improve.    Pastor Falling, MD 01/09/2024, 3:40 PM  [facility] [address], [address] [place], [place] [address] [phone]

## 2024-01-10 DIAGNOSIS — R42 Dizziness and giddiness: Secondary | ICD-10-CM | POA: Diagnosis not present

## 2024-01-10 DIAGNOSIS — Z8673 Personal history of transient ischemic attack (TIA), and cerebral infarction without residual deficits: Secondary | ICD-10-CM | POA: Diagnosis not present

## 2024-01-21 DIAGNOSIS — I10 Essential (primary) hypertension: Secondary | ICD-10-CM | POA: Diagnosis not present

## 2024-01-21 DIAGNOSIS — D539 Nutritional anemia, unspecified: Secondary | ICD-10-CM | POA: Diagnosis not present

## 2024-01-21 DIAGNOSIS — E782 Mixed hyperlipidemia: Secondary | ICD-10-CM | POA: Diagnosis not present

## 2024-01-21 DIAGNOSIS — E1165 Type 2 diabetes mellitus with hyperglycemia: Secondary | ICD-10-CM | POA: Diagnosis not present

## 2024-01-24 DIAGNOSIS — R42 Dizziness and giddiness: Secondary | ICD-10-CM | POA: Diagnosis not present

## 2024-01-24 DIAGNOSIS — Z8673 Personal history of transient ischemic attack (TIA), and cerebral infarction without residual deficits: Secondary | ICD-10-CM | POA: Diagnosis not present

## 2024-01-24 DIAGNOSIS — E1165 Type 2 diabetes mellitus with hyperglycemia: Secondary | ICD-10-CM | POA: Diagnosis not present

## 2024-01-24 DIAGNOSIS — I10 Essential (primary) hypertension: Secondary | ICD-10-CM | POA: Diagnosis not present

## 2024-01-24 DIAGNOSIS — E785 Hyperlipidemia, unspecified: Secondary | ICD-10-CM | POA: Diagnosis not present

## 2024-02-21 ENCOUNTER — Ambulatory Visit: Payer: Self-pay | Admitting: Cardiology

## 2024-05-07 DIAGNOSIS — I10 Essential (primary) hypertension: Secondary | ICD-10-CM | POA: Diagnosis not present

## 2024-05-07 DIAGNOSIS — D539 Nutritional anemia, unspecified: Secondary | ICD-10-CM | POA: Diagnosis not present

## 2024-05-07 DIAGNOSIS — E785 Hyperlipidemia, unspecified: Secondary | ICD-10-CM | POA: Diagnosis not present

## 2024-05-07 DIAGNOSIS — E039 Hypothyroidism, unspecified: Secondary | ICD-10-CM | POA: Diagnosis not present

## 2024-05-07 DIAGNOSIS — E1165 Type 2 diabetes mellitus with hyperglycemia: Secondary | ICD-10-CM | POA: Diagnosis not present

## 2024-05-13 DIAGNOSIS — I10 Essential (primary) hypertension: Secondary | ICD-10-CM | POA: Diagnosis not present

## 2024-05-13 DIAGNOSIS — E039 Hypothyroidism, unspecified: Secondary | ICD-10-CM | POA: Diagnosis not present

## 2024-05-13 DIAGNOSIS — E1165 Type 2 diabetes mellitus with hyperglycemia: Secondary | ICD-10-CM | POA: Diagnosis not present

## 2024-05-13 DIAGNOSIS — E785 Hyperlipidemia, unspecified: Secondary | ICD-10-CM | POA: Diagnosis not present

## 2024-06-16 ENCOUNTER — Other Ambulatory Visit (HOSPITAL_COMMUNITY): Payer: Self-pay

## 2024-06-16 DIAGNOSIS — Z1231 Encounter for screening mammogram for malignant neoplasm of breast: Secondary | ICD-10-CM

## 2024-06-23 ENCOUNTER — Ambulatory Visit (HOSPITAL_COMMUNITY)

## 2024-07-14 ENCOUNTER — Ambulatory Visit (HOSPITAL_COMMUNITY)
